# Patient Record
Sex: Male | Born: 1950 | Race: White | Hispanic: No | Smoking: Former smoker
Health system: Southern US, Community
[De-identification: ages and names within clinical notes are randomized; demographics above are authoritative.]

## PROBLEM LIST (undated history)

## (undated) DIAGNOSIS — S42101A Fracture of unspecified part of scapula, right shoulder, initial encounter for closed fracture: Secondary | ICD-10-CM

## (undated) DIAGNOSIS — R7303 Prediabetes: Secondary | ICD-10-CM

## (undated) DIAGNOSIS — F329 Major depressive disorder, single episode, unspecified: Secondary | ICD-10-CM

## (undated) DIAGNOSIS — F1011 Alcohol abuse, in remission: Secondary | ICD-10-CM

## (undated) DIAGNOSIS — C61 Malignant neoplasm of prostate: Secondary | ICD-10-CM

## (undated) DIAGNOSIS — S2249XA Multiple fractures of ribs, unspecified side, initial encounter for closed fracture: Secondary | ICD-10-CM

## (undated) DIAGNOSIS — K219 Gastro-esophageal reflux disease without esophagitis: Secondary | ICD-10-CM

## (undated) DIAGNOSIS — D45 Polycythemia vera: Secondary | ICD-10-CM

## (undated) DIAGNOSIS — I499 Cardiac arrhythmia, unspecified: Secondary | ICD-10-CM

## (undated) DIAGNOSIS — G4733 Obstructive sleep apnea (adult) (pediatric): Secondary | ICD-10-CM

## (undated) DIAGNOSIS — F32A Depression, unspecified: Secondary | ICD-10-CM

## (undated) DIAGNOSIS — I4891 Unspecified atrial fibrillation: Secondary | ICD-10-CM

## (undated) DIAGNOSIS — R7989 Other specified abnormal findings of blood chemistry: Secondary | ICD-10-CM

## (undated) DIAGNOSIS — G4730 Sleep apnea, unspecified: Secondary | ICD-10-CM

## (undated) DIAGNOSIS — M109 Gout, unspecified: Secondary | ICD-10-CM

## (undated) DIAGNOSIS — Z973 Presence of spectacles and contact lenses: Secondary | ICD-10-CM

## (undated) HISTORY — DX: Fracture of unspecified part of scapula, right shoulder, initial encounter for closed fracture: S42.101A

## (undated) HISTORY — PX: OTHER SURGICAL HISTORY: SHX169

## (undated) HISTORY — DX: Unspecified atrial fibrillation: I48.91

## (undated) HISTORY — DX: Multiple fractures of ribs, unspecified side, initial encounter for closed fracture: S22.49XA

## (undated) HISTORY — DX: Other specified abnormal findings of blood chemistry: R79.89

## (undated) HISTORY — DX: Gastro-esophageal reflux disease without esophagitis: K21.9

## (undated) HISTORY — DX: Major depressive disorder, single episode, unspecified: F32.9

## (undated) HISTORY — DX: Gout, unspecified: M10.9

## (undated) HISTORY — DX: Polycythemia vera: D45

## (undated) HISTORY — DX: Alcohol abuse, in remission: F10.11

## (undated) HISTORY — PX: PENILE PROSTHESIS IMPLANT: SHX240

## (undated) HISTORY — DX: Obstructive sleep apnea (adult) (pediatric): G47.33

## (undated) HISTORY — DX: Depression, unspecified: F32.A

---

## 2003-06-19 ENCOUNTER — Ambulatory Visit (HOSPITAL_COMMUNITY): Admission: RE | Admit: 2003-06-19 | Discharge: 2003-06-19 | Payer: Self-pay | Admitting: Family Medicine

## 2004-12-29 ENCOUNTER — Ambulatory Visit (HOSPITAL_COMMUNITY): Admission: RE | Admit: 2004-12-29 | Discharge: 2004-12-29 | Payer: Self-pay | Admitting: Internal Medicine

## 2005-01-07 ENCOUNTER — Ambulatory Visit (HOSPITAL_COMMUNITY): Admission: RE | Admit: 2005-01-07 | Discharge: 2005-01-07 | Payer: Self-pay | Admitting: Internal Medicine

## 2005-01-08 ENCOUNTER — Ambulatory Visit (HOSPITAL_COMMUNITY): Admission: RE | Admit: 2005-01-08 | Discharge: 2005-01-08 | Payer: Self-pay | Admitting: Internal Medicine

## 2005-01-15 ENCOUNTER — Ambulatory Visit: Admission: RE | Admit: 2005-01-15 | Discharge: 2005-01-15 | Payer: Self-pay | Admitting: Internal Medicine

## 2005-02-04 ENCOUNTER — Ambulatory Visit: Payer: Self-pay | Admitting: Pulmonary Disease

## 2005-08-26 ENCOUNTER — Ambulatory Visit (HOSPITAL_COMMUNITY): Admission: RE | Admit: 2005-08-26 | Discharge: 2005-08-26 | Payer: Self-pay | Admitting: Internal Medicine

## 2005-08-26 ENCOUNTER — Ambulatory Visit: Payer: Self-pay | Admitting: Internal Medicine

## 2006-06-27 ENCOUNTER — Ambulatory Visit: Payer: Self-pay | Admitting: Orthopedic Surgery

## 2006-07-14 ENCOUNTER — Inpatient Hospital Stay (HOSPITAL_COMMUNITY): Admission: EM | Admit: 2006-07-14 | Discharge: 2006-07-19 | Payer: Self-pay | Admitting: Emergency Medicine

## 2006-07-27 ENCOUNTER — Emergency Department (HOSPITAL_COMMUNITY): Admission: EM | Admit: 2006-07-27 | Discharge: 2006-07-27 | Payer: Self-pay | Admitting: Emergency Medicine

## 2006-10-20 ENCOUNTER — Ambulatory Visit: Payer: Self-pay | Admitting: Orthopedic Surgery

## 2006-10-25 ENCOUNTER — Encounter (HOSPITAL_COMMUNITY): Admission: RE | Admit: 2006-10-25 | Discharge: 2006-11-24 | Payer: Self-pay | Admitting: Orthopedic Surgery

## 2006-10-27 ENCOUNTER — Ambulatory Visit (HOSPITAL_COMMUNITY): Admission: RE | Admit: 2006-10-27 | Discharge: 2006-10-27 | Payer: Self-pay | Admitting: Orthopedic Surgery

## 2006-11-02 ENCOUNTER — Ambulatory Visit: Payer: Self-pay | Admitting: Orthopedic Surgery

## 2006-11-29 ENCOUNTER — Ambulatory Visit: Payer: Self-pay

## 2006-12-02 ENCOUNTER — Encounter: Admission: RE | Admit: 2006-12-02 | Discharge: 2006-12-02 | Payer: Self-pay | Admitting: Orthopedic Surgery

## 2006-12-12 ENCOUNTER — Ambulatory Visit (HOSPITAL_BASED_OUTPATIENT_CLINIC_OR_DEPARTMENT_OTHER): Admission: RE | Admit: 2006-12-12 | Discharge: 2006-12-12 | Payer: Self-pay | Admitting: Orthopedic Surgery

## 2007-01-04 ENCOUNTER — Inpatient Hospital Stay (HOSPITAL_COMMUNITY): Admission: RE | Admit: 2007-01-04 | Discharge: 2007-01-08 | Payer: Self-pay | Admitting: Orthopedic Surgery

## 2008-04-19 HISTORY — PX: HIP SURGERY: SHX245

## 2008-09-09 ENCOUNTER — Observation Stay (HOSPITAL_COMMUNITY): Admission: EM | Admit: 2008-09-09 | Discharge: 2008-09-10 | Payer: Self-pay | Admitting: Emergency Medicine

## 2008-09-11 ENCOUNTER — Encounter (HOSPITAL_COMMUNITY): Admission: RE | Admit: 2008-09-11 | Discharge: 2008-10-11 | Payer: Self-pay | Admitting: Internal Medicine

## 2009-07-06 IMAGING — CR DG KNEE COMPLETE 4+V*R*
5 series · 5 of 5 positions shown · non-contrast
Comparison: none

CLINICAL DATA: 55 year old; ACL tear.
 RIGHT KNEE ? 4 VIEWS ? 10/27/06:

[view not recorded (1 of 5)]
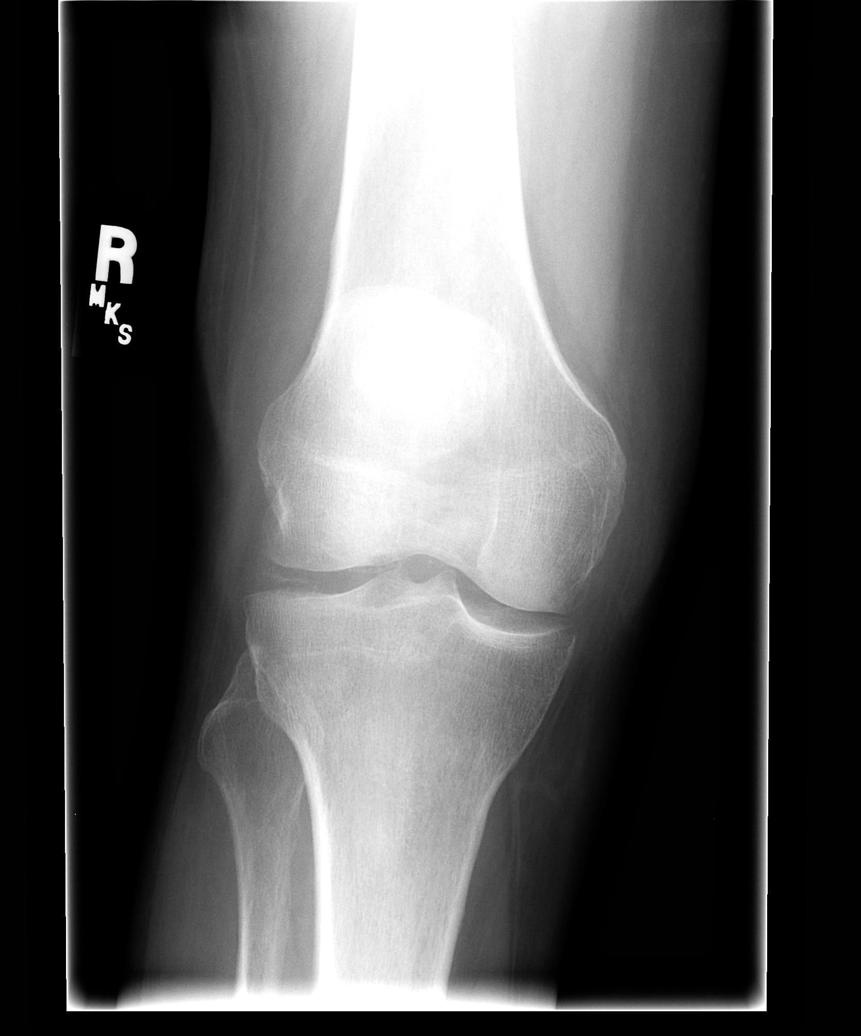

[view not recorded (2 of 5)]
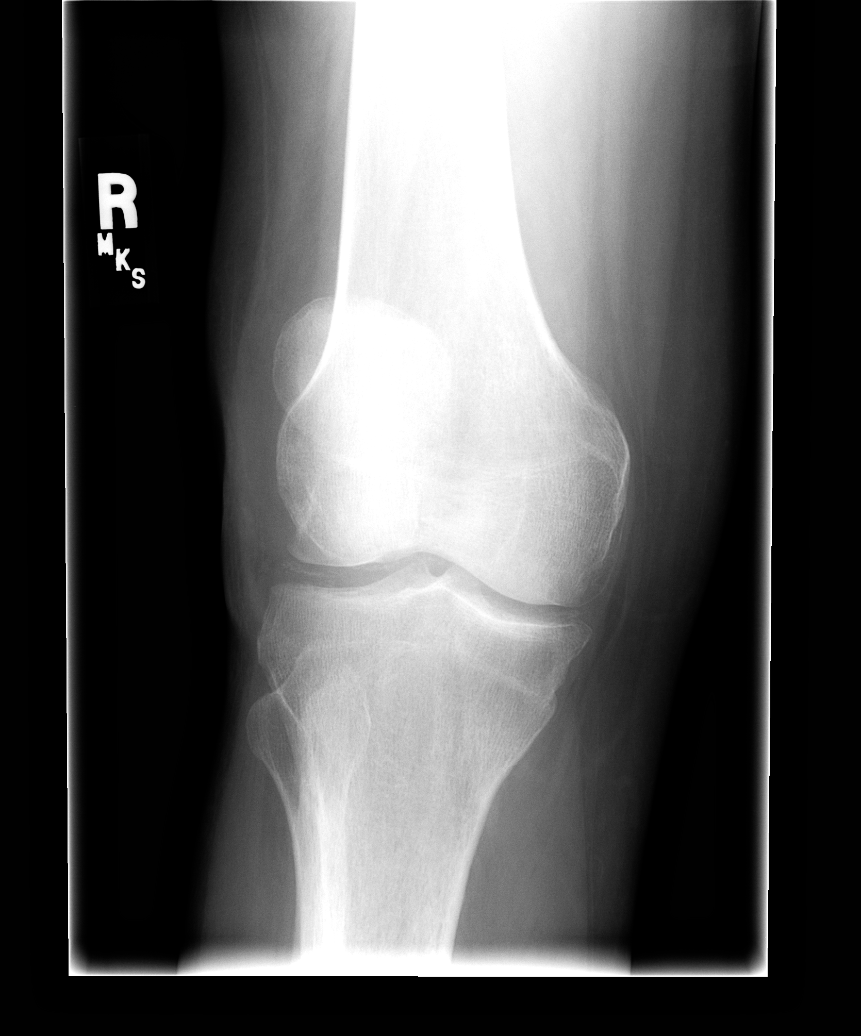

[view not recorded (3 of 5)]
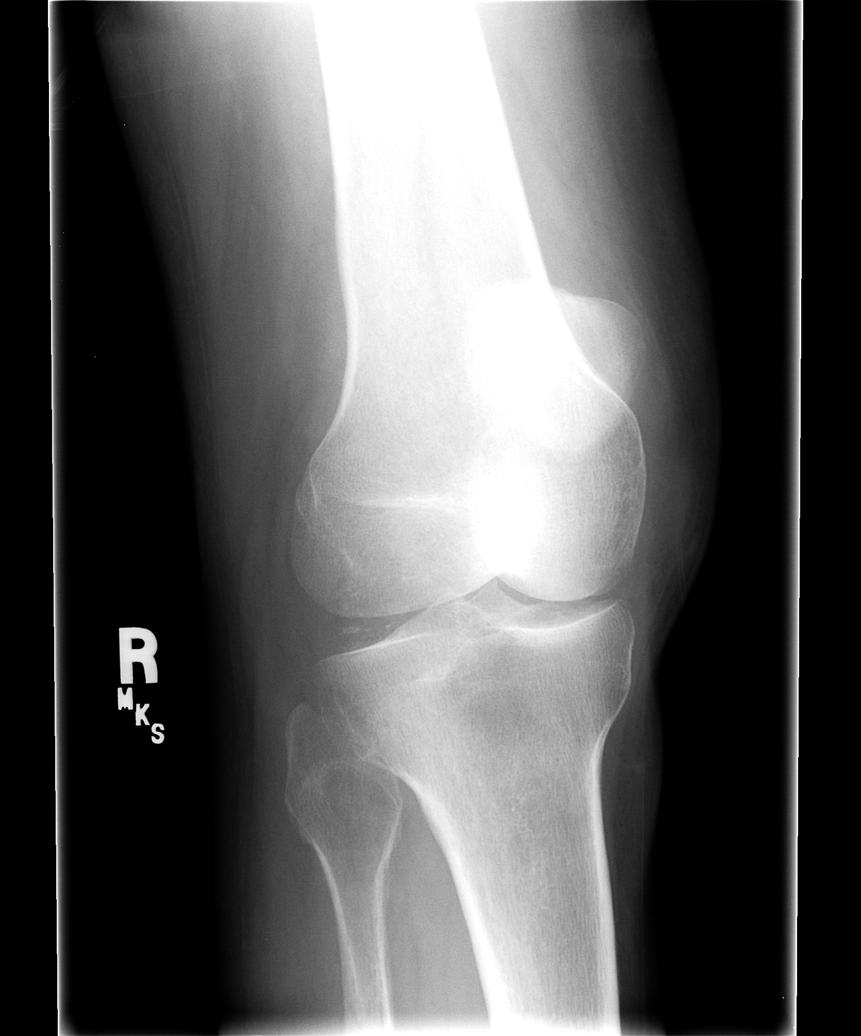

[view not recorded (4 of 5)]
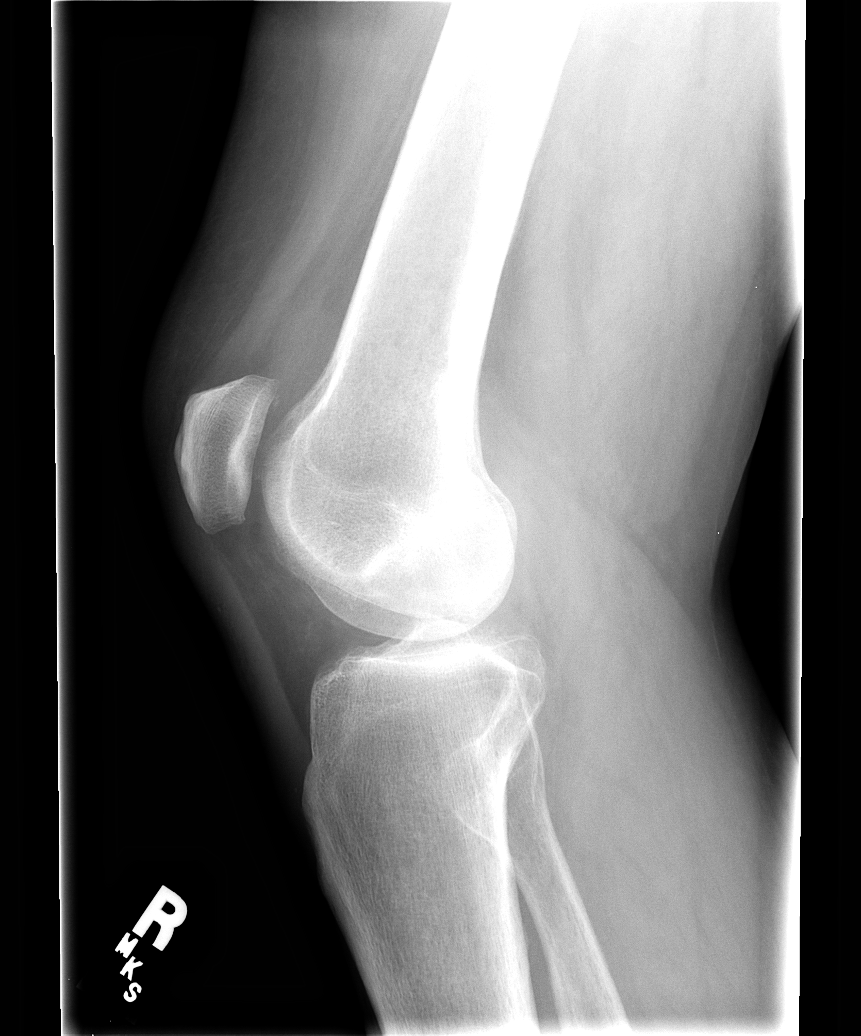

[view not recorded (5 of 5)]
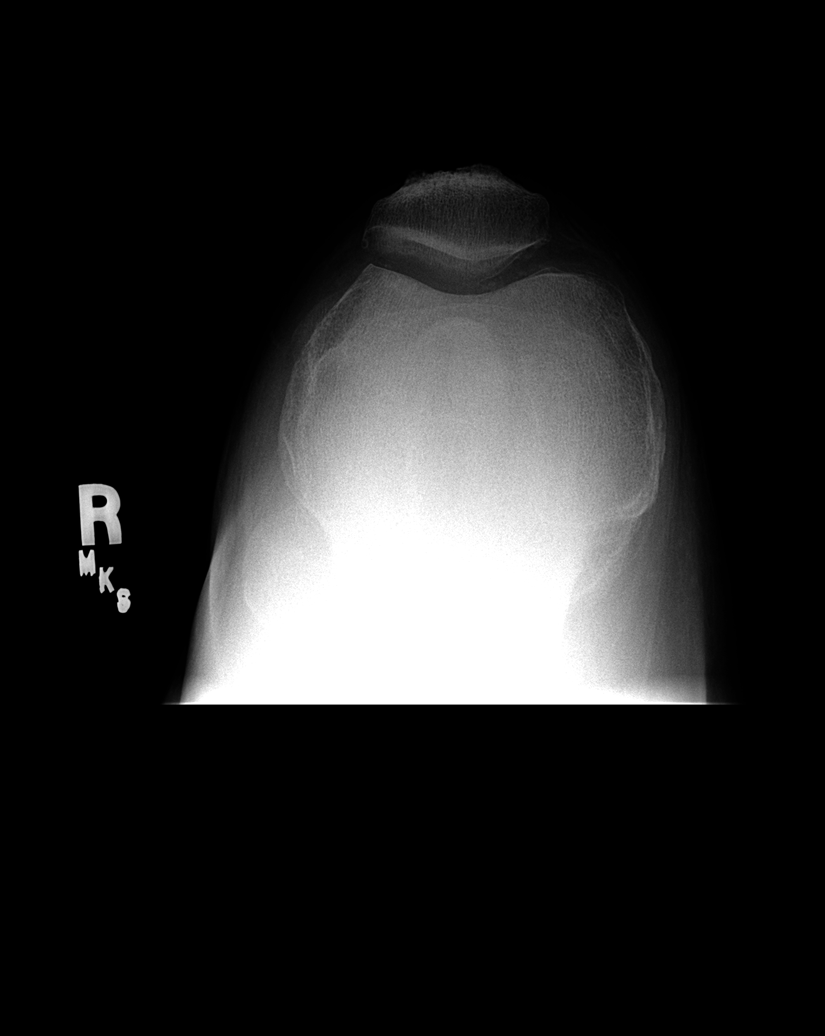

[5 of 5 positions shown; findings below may reference images not displayed]

FINDINGS: The joint spaces are fairly well maintained.  There is no significant degenerative change.  There is a small suprapatellar knee joint effusion.  Chondrocalcinosis is noted.
IMPRESSION: 1.  No acute bony findings.  
 2.  Small joint effusion.
 3.  Chondrocalcinosis.

## 2010-07-28 LAB — URINALYSIS, MICROSCOPIC ONLY
Bilirubin Urine: NEGATIVE
Ketones, ur: NEGATIVE mg/dL
Leukocytes, UA: NEGATIVE
Nitrite: NEGATIVE
Protein, ur: NEGATIVE mg/dL
Urobilinogen, UA: 0.2 mg/dL (ref 0.0–1.0)
pH: 5.5 (ref 5.0–8.0)

## 2010-07-28 LAB — DIFFERENTIAL
Basophils Absolute: 0.1 10*3/uL (ref 0.0–0.1)
Basophils Relative: 1 % (ref 0–1)
Eosinophils Absolute: 0.1 10*3/uL (ref 0.0–0.7)
Eosinophils Relative: 2 % (ref 0–5)
Lymphocytes Relative: 19 % (ref 12–46)
Lymphs Abs: 1.2 10*3/uL (ref 0.7–4.0)
Monocytes Absolute: 0.5 10*3/uL (ref 0.1–1.0)
Monocytes Relative: 7 % (ref 3–12)
Neutro Abs: 4.8 10*3/uL (ref 1.7–7.7)
Neutrophils Relative %: 72 % (ref 43–77)

## 2010-07-28 LAB — CARDIAC PANEL(CRET KIN+CKTOT+MB+TROPI)
CK, MB: 2.2 ng/mL (ref 0.3–4.0)
Total CK: 46 U/L (ref 7–232)
Troponin I: 0.02 ng/mL (ref 0.00–0.06)

## 2010-07-28 LAB — URINALYSIS, ROUTINE W REFLEX MICROSCOPIC
Bilirubin Urine: NEGATIVE
Glucose, UA: NEGATIVE mg/dL
Hgb urine dipstick: NEGATIVE
Ketones, ur: NEGATIVE mg/dL
Nitrite: NEGATIVE
Protein, ur: NEGATIVE mg/dL
Specific Gravity, Urine: 1.02 (ref 1.005–1.030)
Urobilinogen, UA: 0.2 mg/dL (ref 0.0–1.0)
pH: 6 (ref 5.0–8.0)

## 2010-07-28 LAB — CBC
HCT: 42.8 % (ref 39.0–52.0)
Hemoglobin: 15.3 g/dL (ref 13.0–17.0)
MCHC: 35.8 g/dL (ref 30.0–36.0)
MCV: 89.5 fL (ref 78.0–100.0)
Platelets: 199 10*3/uL (ref 150–400)
RBC: 4.78 MIL/uL (ref 4.22–5.81)
RDW: 13.2 % (ref 11.5–15.5)
WBC: 6.7 10*3/uL (ref 4.0–10.5)

## 2010-07-28 LAB — COMPREHENSIVE METABOLIC PANEL
Albumin: 4 g/dL (ref 3.5–5.2)
Alkaline Phosphatase: 73 U/L (ref 39–117)
BUN: 21 mg/dL (ref 6–23)
Calcium: 9.4 mg/dL (ref 8.4–10.5)
GFR calc Af Amer: >60 mL/min (ref >60)
GFR calc non Af Amer: 58 mL/min — ABNORMAL LOW (ref >60)
Glucose, Bld: 111 mg/dL — ABNORMAL HIGH (ref 70–99)
Total Protein: 6.6 g/dL (ref 6.0–8.3)

## 2010-07-28 LAB — LIPASE, BLOOD: Lipase: 27 U/L (ref 11–59)

## 2010-07-28 LAB — POCT CARDIAC MARKERS
Myoglobin, poc: 38.5 ng/mL (ref 12–200)
Troponin i, poc: <0.05 ng/mL (ref 0.00–0.09)
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
## 2010-09-01 NOTE — Op Note (Signed)
NAMEJAYMISON, LUBER NO.:  [id number]   MEDICAL RECORD NO.:  [id number]          PATIENT TYPE:  INP   LOCATION:  5025                         FACILITY:  [facility]   PHYSICIAN:  Feliberto Gottron. Turner Daniels, M.D.   DATE OF BIRTH:  1950-07-25   DATE OF PROCEDURE:  01/04/2007  DATE OF DISCHARGE:  01/08/2007                               OPERATIVE REPORT   PREOPERATIVE DIAGNOSIS:  Right proximal femur malunion after placement  of a DHS screw by another physician at another hospital.   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   PROCEDURE:  Right total hip arthroplasty complex using a DePuy 54 mm  cup, 47 NK +0 ball, ASR 20 x 15 x 165 x 42 stem, +0 neck, a 20 D small  cone was used on the stem.   SURGEON:  Feliberto Gottron. Turner Daniels, M.D.   FIRST ASSISTANT:  Skip Mayer PA-C.   ANESTHETIC:  General endotracheal.   ESTIMATED BLOOD LOSS:  400 mL.   DRAINS PLACED:  None.   TOURNIQUET TIME:  None.   FLUID REPLACEMENT:  1800 mL crystalloid.   INDICATIONS FOR PROCEDURE:  A 60 year old gentleman from [facility] who had a right hip intertrochanteric fracture about 8 or  9 months ago fixed by another physician with a DHS screw.  Unfortunately  went to settle into a nonunion in valgus and because of persistent  debilitating pain, he desires elective removal of the DHS screw and  conversion to a total hip arthroplasty.  Risks and benefits of surgery  discussed preoperatively.  A CT scan confirming the malunion/nonunion.  Risks and benefits of surgery discussed, questions answered.   DESCRIPTION OF PROCEDURE:  The patient identified by armband, taken to  the operating room at [facility].  Appropriate anesthetic  monitors were attached and general endotracheal anesthesia induced with  the patient in the supine position.  He was then placed in the left  lateral decubitus position and  fixed there with a Stulberg Mark II  pelvic clamp and the right lower extremity prepped and draped in usual  sterile fashion from the ankle to the hemipelvis.  The skin along the  lateral hip and thigh infiltrated with 20 mL of half percent Marcaine  and epinephrine solution and utilizing the old DHS incision distally a  posterolateral approach to the hip was made approximately 25 cm in  length through the skin and subcutaneous tissue.  Small bleeders were  identified and cauterized.  IT band was cut in line with the skin  incision exposing the vastus lateralis which was split over the plate  which was then easily removed by removing the screws, the side plate and  the barrel.  We then directed our attention to the posterior aspect of  the hip joint.  Cobra retractors were placed between the gluteus minimus  and superior hip joint capsule and between the quadratus femoris and  inferior hip joint capsule exposing the piriformis and short external  rotators which were then  cut off their insertion on the  intertrochanteric crest after tagging with a #2 Ethilon suture.  The  joint capsule was then developed into an acetabular based flap going  from posterior-superior out over the neck and posterior inferior and  likewise tagged with two #2 Ethibond sutures.  This exposed the  malunion, nonunion site which was not completely united.  The hip was  flexed and internally rotated, dislocating the femoral head which was  then cut 4 or 5 mm above the lesser trochanter where the  nonunion/malunion was anyway.  The head was almost in a straight valgus  with essentially no neck shaft angle.  At this point the proximal femur  was translocated anteriorly, levering off the anterior column with a  Homan retractor.  We sequentially reamed the acetabulum up to a 53 mm  basket reamer obtaining good coverage in all quadrants.  The acetabulum  was then irrigated out with normal saline solution and a 54-mm ASR  cup  was hammered into place in 45 degrees of abduction and 20 degrees of  anteversion.  At this point the hip was flexed and internally rotated  exposing the proximal femur which was sequentially reamed up to a 15.5  axial reamer followed by conical milling up to a 20 D cone and then the  calcar which was a small was actually placed into the greater trochanter  because of the malunion.  We then performed a trial with a 20 D small  cone trial stem NK +0, 47-mm trial ball was then placed.  The hip  reduced and stability noted to flexion of 90, internal rotation of 70  and he could not be dislocated in external rotation and extension.  At  this point the trial components were removed.  We hammered into place a  20 D small cone.  Again the spout was placed laterally because of the  malunion and then hammered into place a 20 x 15 x 165 x 42 stem in about  15 degrees of anteversion relation to the knee.  An NK +0 ,47 mm  ultimate ball was hammered on the stem.  The hip again reduced and  stability noted to be excellent.  At this point the wound was once again  irrigated out with normal saline solution.  Because of the shortness of  the neck from the malunion, a formal repair of the capsule and short  external rotators was not possible but they were sewed back to the  intertrochanteric crest.  Small bleeders were once again identified and  cauterized.  The vastus lateralis was closed with running zero Vicryl  suture.  The IT band with running #1 Vicryl suture.  The subcutaneous  tissue with zero and 2-0 undyed Vicryl suture and skin with running  interlocking 3-0 nylon suture.  A dressing of Xeroform, 4x4 dressing  sponges and Hypafix tape was then applied.  The patient was then  unclamped, rolled supine, awakened and taken to recovery room without  difficulty.      Feliberto Gottron. Turner Daniels, M.D.  Electronically Signed     FJR/MEDQ  D:  03/20/2007  T:  03/20/2007  Job:  [id number]
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
## 2010-09-04 NOTE — Discharge Summary (Signed)
Brandon, Maddox NO.:  [id number]   MEDICAL RECORD NO.:  [id number]          PATIENT TYPE:  INP   LOCATION:  5025                         FACILITY:  [facility]   PHYSICIAN:  Feliberto Gottron. Turner Daniels, M.D.   DATE OF BIRTH:  1950-12-30   DATE OF ADMISSION:  01/04/2007  DATE OF DISCHARGE:  01/08/2007                               DISCHARGE SUMMARY   DIAGNOSES:  Malunion of right hip, status post open reduction and  internal fixation.   PROCEDURE WHILE IN HOSPITAL:  Right total hip arthroplasty.   DISCHARGE SUMMARY:  Patient is a 60 year old male with a history of pain  in his right hip after undergoing ORIF on July 14, 2006 by Dr. Hilda Lias.  CT revealed a positive malunion.  He has failed conservative treatment  and now desires a right total hip arthroplasty after removal of  hardware.  The risks and benefits were discussed.  He wishes to proceed.   ALLERGIES:  PENICILLIN WHICH CAUSES SWELLING.   MEDICATIONS ON ADMISSION:  Colchicine, Roxicet and omeprazole.   PAST MEDICAL HISTORY:  Usual childhood diseases.  Adult:  History of  gout and peptic ulcer disease.   PAST SURGICAL HISTORY:  Pyloric stenosis in 1953, knee scopes in 1988,  2001 and 2008, penile implant in 1996, ORIF of right hip in 2008.   FAMILY HISTORY:  Mother alive and well at age 79, history of breast  cancer.  Father alive at 9, history of diabetes.   REVIEW OF SYSTEMS:  Positive for glasses, abnormal heartbeat.  He denies  any shortness of breath, chest pain or recent illness.   PHYSICAL EXAMINATION:  VITAL SIGNS:  Temperature 99.7, pulse 82,  respirations 16, blood pressure 137/92.  He is a 5 feet 11 inch, 210-  pound male.  HEENT:  Head is normocephalic and atraumatic.  Ears:  TMs are clear.  Eyes:  Pupils equal, round and reactive to light and accommodation.  Nose:  Patent.  NECK:  Supple.  Full range of motion.  CHEST:  Clear to auscultation and percussion.  HEART:  Regular rate and rhythm.  ABDOMEN:  Soft and nontender.  EXTREMITIES:  Right hip range of motion:  Forward flexion of 90 degrees,  internal rotation 0 degrees positive pain, external rotation 50 degrees  positive pain, positive foot tap.  SKIN:  There is a well-healed normal scar from previous surgery.   LABORATORY STUDIES:  Preoperative CT showed positive malunion.   LABORATORY DATA:  Preoperative labs including CBC, CMET, chest x-ray,  EKG, PT and PTT were within normal limits.   HOSPITAL COURSE:  The patient was taken to the operating room at [facility] where he underwent a right total hip arthroplasty using DePuy  ASR components, a 54-mm acetabular cup that accepted a size 47 ball.  Stem was a 20 x 15 x 165 42 standard neck plus 0.  A 20 degree small  cone was used.  Patient was placed on perioperative antibiotics.  He was  placed on postoperative Coumadin prophylaxis with target INR of 1.5 to 2  with bridging Lovenox  until it became therapeutic.  He was given a PCA  pump for pain control.  He developed tachycardia from 120-130.  An EKG  was done which showed an inferior infarct.  He was in no distress at  that time but rapid response was notified.  Cardiology was consulted  preoperatively.  He is a patient of Dr. Patty Sermons, and he was kind  enough to consult and do the full workup regarding the patient's  tachycardia.  On postop day 1, the patient was awake and alert,  reporting no nausea or vomiting.  Denies any shortness of breath or  chest pain.  Vital signs are stable.  He is afebrile with a hemoglobin  of 11.4, WBC 7.6, PT 13.3, pulse of 101, temperature 98.6.  Wound was  clean and dry with scant drainage.  X-rays shows well placed and fixed  complex total hip arthroplasty.  Urine output 85 mL per shift.  He began  physical therapy, 50% partial weightbearing with PT of his right lower  extremity.  He will continue with physical therapy.  He was judged to be  stable by cardiology and they continued  to monitor.  On postoperative  day 2, patient continued to be radiologically stable.  Orthopedically he  was making steady progress.  Hemoglobin dropped 9.6, WBC of 5.6, INR  1.1, pulse 88.  He was afebrile.  Dressing was dry.  His abdomen was  soft.  Otherwise stable.  PCA was discontinued.  On postoperative day 3,  patient continued to make steady progress in physical therapy.  He had  no new complaints of cardiology.  Continued to improve in both range of  motion and decreased pain.  On postoperative day 4, the patient was  without complaint.  He was afebrile.  Vital signs were stable.  Hemoglobin 8.6.  Chest was clear.  Incision was clean and dry.  He was  otherwise neurovascularly intact, medically stable and orthopedically  improved.  He had symptoms of blood loss anemia.  He was discharged home  to the care of his family.   ACTIVITY:  Is 50% weightbearing with total hip precautions and walker.  Dressing changes daily or as needed.   DIET:  Is regular.   MEDICATIONS:  Resume home medications including colchicine, Prilosec and  was given prescriptions for Robaxin, Percocet and Coumadin.   FOLLOW UP:  He is to return to clinic in 1 week's time for followup with  Dr. Turner Daniels, sooner if he is having difficulties orthopedically.  He will  return with Dr. Patty Sermons if he has any trouble with his heart.   DIAGNOSIS:  Once again the diagnosis for this admission was malunion  with pain, right hip.   PROCEDURE WHILE IN THE HOSPITAL:  Right total hip arthroplasty.      Laural Benes. Jannet Mantis.      Feliberto Gottron. Turner Daniels, M.D.  Electronically Signed    JBR/MEDQ  D:  03/06/2007  T:  03/06/2007  Job:  [id number]
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
## 2010-09-04 NOTE — Procedures (Signed)
NAMELORCAN, SHELP NO.:  [id number]   MEDICAL RECORD NO.:  [id number]          PATIENT TYPE:  OUT   LOCATION:  SLEEP LAB                     FACILITY:  [facility]   PHYSICIAN:  Marcelyn Bruins, M.D. [facility] DATE OF BIRTH:  1950-08-29   DATE OF STUDY:  01/15/2005                              NOCTURNAL POLYSOMNOGRAM   REFERRING PHYSICIAN:  Dr. Carylon Perches.   DATE OF STUDY:  January 15, 2005.   INDICATION FOR SURGERY:  Persistent disorder of initiating and maintaining  wakefulness.   EPWORTH SLEEPINESS SCORE:  18.   SLEEP ARCHITECTURE:  The patient has a total sleep time of 320 minutes with  adequate slow wave sleep, but decreased REM.  Sleep onset latency was  prolonged at 45 minutes and REM onset was fairly short at 73 minutes.  Sleep  efficiency was 82%.   RESPIRATORY DATA:  The patient was found to have moderate numbers of apneas  and hypopneas for a respiratory disturbance index of 11 events per hour.  Events were not positional, but they were associated with moderate-to-loud  snoring.   OXYGEN DATA:  The patient had a O2 desaturation as low as 85% associated  with his obstructive events.   CARDIAC DATA:  There were no clinically significant cardiac arrhythmias.   MOVEMENT/PARASOMNIA:  The patient was found to have large numbers of leg  jerks with mild-to-moderate sleep disruption.  This is unclear in the study  whether these are clinically significant or not.  Clinical correlation is  suggested.   IMPRESSION/RECOMMENDATIONS:  1.  Mild obstructive sleep apnea noted with oxygen desaturation as low as      85%.  Treatment for this degree of sleep apnea may include weight loss      if appropriate, upper airway surgery, oral appliance, and continuous      positive airway pressure.  2.  Leg jerks with mild-to-moderate sleep disruption of unknown clinical      significance.  Again, clinical correlation is suggested.     ______________________________  Marcelyn Bruins, M.D. [facility]  Diplomate, American Board of Sleep  Medicine     KC/MEDQ  D:  02/02/2005 08:25:18  T:  02/02/2005 09:28:03  Job:  [id number]

## 2011-01-01 ENCOUNTER — Other Ambulatory Visit: Payer: Self-pay | Admitting: Orthopedic Surgery

## 2011-01-01 ENCOUNTER — Ambulatory Visit
Admission: RE | Admit: 2011-01-01 | Discharge: 2011-01-01 | Disposition: A | Discharge status: Home / Self Care | Payer: BC Managed Care – PPO | Source: Ambulatory Visit | Attending: Orthopedic Surgery | Admitting: Orthopedic Surgery

## 2011-01-01 DIAGNOSIS — M25519 Pain in unspecified shoulder: Secondary | ICD-10-CM

## 2011-01-01 DIAGNOSIS — R0781 Pleurodynia: Secondary | ICD-10-CM

## 2011-01-07 ENCOUNTER — Encounter: Payer: Self-pay | Admitting: *Deleted

## 2011-01-07 ENCOUNTER — Ambulatory Visit (INDEPENDENT_AMBULATORY_CARE_PROVIDER_SITE_OTHER): Payer: BC Managed Care – PPO | Admitting: Thoracic Surgery

## 2011-01-07 ENCOUNTER — Encounter: Payer: Self-pay | Admitting: Thoracic Surgery

## 2011-01-07 VITALS — BP 104/68 | HR 84 | Resp 20 | Ht 71.0 in | Wt 215.0 lb

## 2011-01-07 DIAGNOSIS — S2249XA Multiple fractures of ribs, unspecified side, initial encounter for closed fracture: Secondary | ICD-10-CM | POA: Insufficient documentation
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
## 2011-01-12 ENCOUNTER — Encounter: Payer: Self-pay | Admitting: *Deleted

## 2011-01-28 LAB — BASIC METABOLIC PANEL
BUN: 16
CO2: 26
Calcium: 9.1
Creatinine, Ser: 1.1
GFR calc non Af Amer: >60
Glucose, Bld: 164 — ABNORMAL HIGH
Sodium: 137

## 2011-01-28 LAB — CBC
HCT: 24.5 — ABNORMAL LOW
HCT: 26 — ABNORMAL LOW
HCT: 37.6 — ABNORMAL LOW
Hemoglobin: 11.4 — ABNORMAL LOW
Hemoglobin: 13
Hemoglobin: 9.1 — ABNORMAL LOW
Hemoglobin: 9.6 — ABNORMAL LOW
MCHC: 34.5
MCHC: 34.6
MCHC: 34.7
MCHC: 35.1
MCV: 90.6
MCV: 90.6
MCV: 90.8
MCV: 91.6
Platelets: 168
Platelets: 198
RBC: 3.03 — ABNORMAL LOW
RBC: 3.63 — ABNORMAL LOW
RBC: 4.15 — ABNORMAL LOW
RDW: 13.7
RDW: 13.8
RDW: 14.2 — ABNORMAL HIGH
RDW: 14.3 — ABNORMAL HIGH
WBC: 7.6

## 2011-01-28 LAB — CK TOTAL AND CKMB (NOT AT ARMC)
CK, MB: 1.4
CK, MB: 2.1
Total CK: 120

## 2011-01-28 LAB — PROTIME-INR
INR: 1
Prothrombin Time: 13.3
Prothrombin Time: 14.5

## 2011-01-28 LAB — CARDIAC PANEL(CRET KIN+CKTOT+MB+TROPI): CK, MB: 2.3

## 2011-01-28 LAB — TROPONIN I: Troponin I: 0.02

## 2011-01-29 LAB — DIFFERENTIAL
Basophils Absolute: 0
Eosinophils Absolute: 0.3
Eosinophils Relative: 5
Lymphocytes Relative: 32
Lymphs Abs: 1.6
Neutrophils Relative %: 55

## 2011-01-29 LAB — CBC
HCT: 44
Hemoglobin: 15.4
MCHC: 35.1
MCV: 89.9
RBC: 4.9
WBC: 4.9

## 2011-01-29 LAB — COMPREHENSIVE METABOLIC PANEL
CO2: 28
Calcium: 9.7
Chloride: 100
Creatinine, Ser: 0.83
GFR calc non Af Amer: >60
Glucose, Bld: 97
Total Bilirubin: 1

## 2011-01-29 LAB — URINALYSIS, ROUTINE W REFLEX MICROSCOPIC
Glucose, UA: NEGATIVE
Hgb urine dipstick: NEGATIVE
Ketones, ur: NEGATIVE
Protein, ur: NEGATIVE
Urobilinogen, UA: 0.2

## 2011-01-29 LAB — POCT HEMOGLOBIN-HEMACUE
Hemoglobin: 17.1 — ABNORMAL HIGH
Operator id: 112821

## 2011-01-29 LAB — PROTIME-INR
INR: 0.9
Prothrombin Time: 12.6

## 2011-01-29 LAB — ABO/RH: ABO/RH(D): A POS

## 2011-01-29 LAB — TYPE AND SCREEN: ABO/RH(D): A POS

## 2011-02-04 ENCOUNTER — Ambulatory Visit: Payer: BC Managed Care – PPO | Admitting: Thoracic Surgery

## 2012-01-24 DIAGNOSIS — F32A Depression, unspecified: Secondary | ICD-10-CM | POA: Insufficient documentation

## 2012-01-24 DIAGNOSIS — L0390 Cellulitis, unspecified: Secondary | ICD-10-CM | POA: Insufficient documentation

## 2012-01-24 DIAGNOSIS — M109 Gout, unspecified: Secondary | ICD-10-CM | POA: Insufficient documentation

## 2012-01-24 DIAGNOSIS — E119 Type 2 diabetes mellitus without complications: Secondary | ICD-10-CM | POA: Insufficient documentation

## 2012-01-24 DIAGNOSIS — E785 Hyperlipidemia, unspecified: Secondary | ICD-10-CM | POA: Insufficient documentation

## 2012-01-24 DIAGNOSIS — E236 Other disorders of pituitary gland: Secondary | ICD-10-CM | POA: Insufficient documentation

## 2012-05-03 ENCOUNTER — Emergency Department (HOSPITAL_COMMUNITY)
Admission: EM | Admit: 2012-05-03 | Discharge: 2012-05-04 | Disposition: A | Discharge status: Home / Self Care | Payer: BC Managed Care – PPO | Attending: Emergency Medicine | Admitting: Emergency Medicine

## 2012-05-03 ENCOUNTER — Encounter (HOSPITAL_COMMUNITY): Payer: Self-pay

## 2012-05-03 ENCOUNTER — Encounter (HOSPITAL_COMMUNITY)
Admission: EM | Disposition: A | Discharge status: Home / Self Care | Payer: Self-pay | Source: Home / Self Care | Attending: Emergency Medicine

## 2012-05-03 ENCOUNTER — Emergency Department (HOSPITAL_COMMUNITY): Payer: BC Managed Care – PPO

## 2012-05-03 ENCOUNTER — Encounter (HOSPITAL_COMMUNITY): Payer: Self-pay | Admitting: Anesthesiology

## 2012-05-03 DIAGNOSIS — Y92009 Unspecified place in unspecified non-institutional (private) residence as the place of occurrence of the external cause: Secondary | ICD-10-CM | POA: Insufficient documentation

## 2012-05-03 DIAGNOSIS — Z23 Encounter for immunization: Secondary | ICD-10-CM | POA: Insufficient documentation

## 2012-05-03 DIAGNOSIS — F1010 Alcohol abuse, uncomplicated: Secondary | ICD-10-CM | POA: Insufficient documentation

## 2012-05-03 DIAGNOSIS — S0191XA Laceration without foreign body of unspecified part of head, initial encounter: Secondary | ICD-10-CM

## 2012-05-03 DIAGNOSIS — F10929 Alcohol use, unspecified with intoxication, unspecified: Secondary | ICD-10-CM

## 2012-05-03 DIAGNOSIS — W19XXXA Unspecified fall, initial encounter: Secondary | ICD-10-CM

## 2012-05-03 DIAGNOSIS — S0101XA Laceration without foreign body of scalp, initial encounter: Secondary | ICD-10-CM

## 2012-05-03 DIAGNOSIS — S0180XA Unspecified open wound of other part of head, initial encounter: Secondary | ICD-10-CM | POA: Insufficient documentation

## 2012-05-03 HISTORY — PX: LACERATION REPAIR: SHX5284

## 2012-05-03 LAB — CBC WITH DIFFERENTIAL/PLATELET
Basophils Absolute: 0 10*3/uL (ref 0.0–0.1)
Eosinophils Relative: 2 % (ref 0–5)
Lymphocytes Relative: 32 % (ref 12–46)
Lymphs Abs: 1.6 10*3/uL (ref 0.7–4.0)
Neutro Abs: 3.1 10*3/uL (ref 1.7–7.7)
Neutrophils Relative %: 61 % (ref 43–77)
Platelets: 154 10*3/uL (ref 150–400)
RBC: 4.86 MIL/uL (ref 4.22–5.81)
RDW: 12.3 % (ref 11.5–15.5)
WBC: 5.1 10*3/uL (ref 4.0–10.5)

## 2012-05-03 LAB — ETHANOL: Alcohol, Ethyl (B): 150 mg/dL — ABNORMAL HIGH (ref 0–11)

## 2012-05-03 LAB — BASIC METABOLIC PANEL
CO2: 27 mEq/L (ref 19–32)
Calcium: 9.3 mg/dL (ref 8.4–10.5)
Chloride: 103 mEq/L (ref 96–112)
Glucose, Bld: 104 mg/dL — ABNORMAL HIGH (ref 70–99)
Potassium: 4.7 mEq/L (ref 3.5–5.1)
Sodium: 142 mEq/L (ref 135–145)

## 2012-05-03 LAB — PROTIME-INR
INR: 1.07 (ref 0.00–1.49)
Prothrombin Time: 13.8 seconds (ref 11.6–15.2)

## 2012-05-03 SURGERY — REPAIR, LACERATION, 2 OR MORE
Anesthesia: General | Site: Scalp | Wound class: Dirty or Infected

## 2012-05-03 MED ORDER — CIPROFLOXACIN IN D5W 400 MG/200ML IV SOLN
400.0000 mg | Freq: Once | INTRAVENOUS | Status: AC
  Administered 2012-05-03: 400 mg via INTRAVENOUS
  Filled 2012-05-03: qty 200

## 2012-05-03 MED ORDER — MORPHINE SULFATE 4 MG/ML IJ SOLN
4.0000 mg | Freq: Once | INTRAMUSCULAR | Status: AC
  Administered 2012-05-03: 4 mg via INTRAVENOUS
  Filled 2012-05-03: qty 1

## 2012-05-03 MED ORDER — TETANUS-DIPHTH-ACELL PERTUSSIS 5-2.5-18.5 LF-MCG/0.5 IM SUSP
0.5000 mL | Freq: Once | INTRAMUSCULAR | Status: AC
  Administered 2012-05-03: 0.5 mL via INTRAMUSCULAR
  Filled 2012-05-03: qty 0.5

## 2012-05-03 MED ORDER — LACTATED RINGERS IV SOLN
INTRAVENOUS | Status: DC | PRN
  Administered 2012-05-03 – 2012-05-04 (×2): via INTRAVENOUS

## 2012-05-03 MED ORDER — LIDOCAINE-EPINEPHRINE 1 %-1:100000 IJ SOLN
INTRAMUSCULAR | Status: DC | PRN
  Administered 2012-05-03: 5 mL

## 2012-05-03 MED ORDER — 0.9 % SODIUM CHLORIDE (POUR BTL) OPTIME
TOPICAL | Status: DC | PRN
  Administered 2012-05-03: 1000 mL

## 2012-05-03 SURGICAL SUPPLY — 38 items
CANISTER SUCTION 2500CC (MISCELLANEOUS) ×2 IMPLANT
CLEANER TIP ELECTROSURG 2X2 (MISCELLANEOUS) ×2 IMPLANT
CLOTH BEACON ORANGE TIMEOUT ST (SAFETY) ×2 IMPLANT
CORDS BIPOLAR (ELECTRODE) ×1 IMPLANT
COVER SURGICAL LIGHT HANDLE (MISCELLANEOUS) ×2 IMPLANT
DRAIN CHANNEL 7F FF FLAT (WOUND CARE) IMPLANT
ELECT COATED BLADE 2.86 ST (ELECTRODE) ×2 IMPLANT
ELECT REM PT RETURN 9FT ADLT (ELECTROSURGICAL) ×2
ELECTRODE REM PT RTRN 9FT ADLT (ELECTROSURGICAL) ×1 IMPLANT
EVACUATOR SILICONE 100CC (DRAIN) IMPLANT
GLOVE BIOGEL M 7.0 STRL (GLOVE) ×4 IMPLANT
GLOVE BIOGEL PI IND STRL 7.0 (GLOVE) IMPLANT
GLOVE BIOGEL PI IND STRL 7.5 (GLOVE) ×2 IMPLANT
GLOVE BIOGEL PI INDICATOR 7.0 (GLOVE) ×1
GLOVE BIOGEL PI INDICATOR 7.5 (GLOVE) ×2
GLOVE SURG SS PI 7.5 STRL IVOR (GLOVE) ×2 IMPLANT
GOWN PREVENTION PLUS XLARGE (GOWN DISPOSABLE) ×2 IMPLANT
GOWN STRL NON-REIN LRG LVL3 (GOWN DISPOSABLE) ×3 IMPLANT
KIT BASIN OR (CUSTOM PROCEDURE TRAY) ×2 IMPLANT
KIT ROOM TURNOVER OR (KITS) ×2 IMPLANT
LOCATOR NERVE 3 VOLT (DISPOSABLE) IMPLANT
NS IRRIG 1000ML POUR BTL (IV SOLUTION) ×2 IMPLANT
PAD ARMBOARD 7.5X6 YLW CONV (MISCELLANEOUS) ×4 IMPLANT
PENCIL BUTTON HOLSTER BLD 10FT (ELECTRODE) ×2 IMPLANT
STAPLER VISISTAT 35W (STAPLE) ×1 IMPLANT
SUT ETHILON 3 0 PS 1 (SUTURE) IMPLANT
SUT ETHILON 5 0 PS 2 18 (SUTURE) ×1 IMPLANT
SUT ETHILON 6 0 P 1 (SUTURE) ×1 IMPLANT
SUT SILK 2 0 FS (SUTURE) IMPLANT
SUT SILK 3 0 REEL (SUTURE) ×1 IMPLANT
SUT VIC AB 3-0 PS2 18 (SUTURE) ×4
SUT VIC AB 3-0 PS2 18XBRD (SUTURE) IMPLANT
SUT VIC AB 4-0 P-3 18X BRD (SUTURE) IMPLANT
SUT VIC AB 4-0 P3 18 (SUTURE) ×6
TOWEL OR 17X24 6PK STRL BLUE (TOWEL DISPOSABLE) ×2 IMPLANT
TOWEL OR 17X26 10 PK STRL BLUE (TOWEL DISPOSABLE) ×2 IMPLANT
TRAY ENT MC OR (CUSTOM PROCEDURE TRAY) ×2 IMPLANT
WATER STERILE IRR 1000ML POUR (IV SOLUTION) ×1 IMPLANT
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
## 2012-05-03 NOTE — ED Notes (Signed)
Pt states last time had something to eat or drink 11am

## 2012-05-03 NOTE — Anesthesia Preprocedure Evaluation (Addendum)
Anesthesia Evaluation  Patient identified by MRN, date of birth, ID band Patient awake, Patient confused and Patient unresponsive    Reviewed: Allergy & Precautions, H&P , NPO status , Patient's Chart, lab work & pertinent test results  History of Anesthesia Complications Negative for: history of anesthetic complications  Airway Mallampati: I TM Distance: >3 FB Neck ROM: full    Dental  (+) Teeth Intact, Dental Advidsory Given and Caps   Pulmonary neg pulmonary ROS,  breath sounds clear to auscultation  Pulmonary exam normal       Cardiovascular negative cardio ROS  Rhythm:Regular Rate:Normal     Neuro/Psych Takes meds X 2 for depressionnegative neurological ROS     GI/Hepatic Neg liver ROS, GERD-  Controlled and Medicated,  Endo/Other  negative endocrine ROS  Renal/GU negative Renal ROS  negative genitourinary   Musculoskeletal   Abdominal (+) + obese,   Peds  Hematology negative hematology ROS (+)   Anesthesia Other Findings ETOH  Reproductive/Obstetrics                         Anesthesia Physical Anesthesia Plan  ASA: II and emergent  Anesthesia Plan: General   Post-op Pain Management:    Induction: Intravenous, Rapid sequence and Cricoid pressure planned  Airway Management Planned: Oral ETT  Additional Equipment:   Intra-op Plan:   Post-operative Plan: Extubation in OR  Informed Consent: I have reviewed the patients History and Physical, chart, labs and discussed the procedure including the risks, benefits and alternatives for the proposed anesthesia with the patient or authorized representative who has indicated his/her understanding and acceptance.   Dental Advisory Given  Plan Discussed with: Anesthesiologist, CRNA and Surgeon  Anesthesia Plan Comments: (Plan routine monitors, GETA with RSI)       Anesthesia Quick Evaluation

## 2012-05-03 NOTE — ED Provider Notes (Signed)
History     CSN: [id number]  Arrival date & time 05/03/12  2956   First MD Initiated Contact with Patient 05/03/12 1900      Chief Complaint  Patient presents with  . laceration to scalp   . Fall    (Consider location/radiation/quality/duration/timing/severity/associated sxs/prior treatment) Patient is a 62 y.o. male presenting with skin laceration. The history is provided by the patient. No language interpreter was used.  Laceration  The incident occurred less than 1 hour ago. The laceration is located on the scalp. The laceration is more than 20 cm in size. Injury mechanism: a fall. The pain is mild. The pain has been constant since onset. It is unknown if a foreign body is present. His tetanus status is unknown.    Past Medical History  Diagnosis Date  . Closed right scapular fracture   . Ribs, multiple fractures     displaced    Past Surgical History  Procedure Date  . Hip surgery 2010    DR. ROWAN    Family History  Problem Relation Age of Onset  . Diabetes Mother   . Diabetes Father     History  Substance Use Topics  . Smoking status: Former Smoker    Quit date: 04/20/2003  . Smokeless tobacco: Never Used  . Alcohol Use: No      Review of Systems  Constitutional: Negative for fever and chills.  HENT: Negative for congestion and sore throat.   Respiratory: Negative for cough and shortness of breath.   Cardiovascular: Negative for chest pain and leg swelling.  Gastrointestinal: Negative for nausea, vomiting, abdominal pain, diarrhea and constipation.  Genitourinary: Negative for dysuria and frequency.  Skin: Positive for wound. Negative for color change and rash.  Neurological: Negative for dizziness and headaches.  Psychiatric/Behavioral: Negative for confusion and agitation.  All other systems reviewed and are negative.    Allergies  Penicillins  Home Medications   Current Outpatient Rx  Name  Route  Sig  Dispense  Refill  . BUPROPION HCL  ER (XL) 150 MG PO TB24   Oral   Take 150 mg by mouth 3 x daily with food.          Marland Kitchen CITALOPRAM HYDROBROMIDE 40 MG PO TABS   Oral   Take 40 mg by mouth daily.          . FEBUXOSTAT 40 MG PO TABS   Oral   Take 80 mg by mouth daily.          Marland Kitchen GEMFIBROZIL 600 MG PO TABS   Oral   Take 600 mg by mouth 2 (two) times daily.          . MELOXICAM 15 MG PO TABS   Oral   Take 15 mg by mouth daily.          . OXYCODONE-ACETAMINOPHEN 5-325 MG PO TABS   Oral   Take 1 tablet by mouth every 4 (four) hours as needed.             BP 126/68  Pulse 91  Temp 98.1 F (36.7 C) (Oral)  SpO2 94%  Physical Exam  Constitutional: He is oriented to person, place, and time. He appears well-developed and well-nourished. No distress.  HENT:  Head: Normocephalic. Head is with laceration.    Eyes: EOM are normal. Pupils are equal, round, and reactive to light.  Neck: Normal range of motion. Neck supple.  Cardiovascular: Normal rate and regular rhythm.   Pulmonary/Chest: Effort  normal. No respiratory distress.  Abdominal: Soft. He exhibits no distension.  Musculoskeletal: Normal range of motion. He exhibits no edema.  Neurological: He is alert and oriented to person, place, and time.  Skin: Skin is warm and dry.  Psychiatric: He has a normal mood and affect. His behavior is normal.    ED Course  Procedures (including critical care time)  Labs Reviewed - No data to display No results found. Results for orders placed during the hospital encounter of 05/03/12  ETHANOL      Component Value Range   Alcohol, Ethyl (B) 150 (*) 0 - 11 mg/dL  CBC WITH DIFFERENTIAL      Component Value Range   WBC 5.1  4.0 - 10.5 K/uL   RBC 4.86  4.22 - 5.81 MIL/uL   Hemoglobin 15.2  13.0 - 17.0 g/dL   HCT 16.1  09.6 - 04.5 %   MCV 89.3  78.0 - 100.0 fL   MCH 31.3  26.0 - 34.0 pg   MCHC 35.0  30.0 - 36.0 g/dL   RDW 40.9  81.1 - 91.4 %   Platelets 154  150 - 400 K/uL   Neutrophils Relative 61  43  - 77 %   Neutro Abs 3.1  1.7 - 7.7 K/uL   Lymphocytes Relative 32  12 - 46 %   Lymphs Abs 1.6  0.7 - 4.0 K/uL   Monocytes Relative 6  3 - 12 %   Monocytes Absolute 0.3  0.1 - 1.0 K/uL   Eosinophils Relative 2  0 - 5 %   Eosinophils Absolute 0.1  0.0 - 0.7 K/uL   Basophils Relative 0  0 - 1 %   Basophils Absolute 0.0  0.0 - 0.1 K/uL  BASIC METABOLIC PANEL      Component Value Range   Sodium 142  135 - 145 mEq/L   Potassium 4.7  3.5 - 5.1 mEq/L   Chloride 103  96 - 112 mEq/L   CO2 27  19 - 32 mEq/L   Glucose, Bld 104 (*) 70 - 99 mg/dL   BUN 19  6 - 23 mg/dL   Creatinine, Ser 7.82  0.50 - 1.35 mg/dL   Calcium 9.3  8.4 - 95.6 mg/dL   GFR calc non Af Amer >90  >90 mL/min   GFR calc Af Amer >90  >90 mL/min  PROTIME-INR      Component Value Range   Prothrombin Time 13.8  11.6 - 15.2 seconds   INR 1.07  0.00 - 1.49   CT Head Wo Contrast (Final result)   Result time:05/03/12 2039    Final result by Rad Results In Interface (05/03/12 20:39:46)    Narrative:   *RADIOLOGY REPORT*  Clinical Data: Fall. Supraorbital scalp laceration.  CT HEAD AND ORBITS WITHOUT CONTRAST  Technique: Contiguous axial images were obtained from the base of the skull through the vertex without contrast. Multidetector CT imaging of the orbits was performed using the standard protocol without intravenous contrast.  Comparison: None.  CT HEAD  Findings: No acute cortical infarct, hemorrhage, or mass lesion is present. The ventricles are of normal size. No significant extra- axial fluid collection is present.  A large right supraorbital scalp hematoma is present. The soft tissue injury extends to the bone in front of the right frontal sinus. There is no underlying fracture. Gas dissects along the bone to the level of the right nasal bones.  IMPRESSION:  1. Large right supraorbital scalp hematoma with subcutaneous  gas. 2. No acute fracture. 3. Normal CT appearance of the brain.  CT  ORBITS  Findings: A large right supraorbital scalp hematoma is present. There is no underlying fracture. There is some gas dissecting within the post septal orbit along the roof of the orbit. The right globe is intact the lens is located. The intraorbital gas may have entered via a medial approach with gas dissecting along the superomedial orbit and nasal bones. Minimal mucosal thickening is present in the left maxillary sinus. The paranasal sinuses are otherwise clear.  IMPRESSION:  1. Large right supraorbital scalp hematoma without an underlying fracture. 2. Gas dissecting along the bone of the superomedial orbit extends into the post septal orbit and can be seen along the roof of the orbit. 2. No underlying fracture.   Original Report Authenticated By: Marin Roberts, M.D.             CT OrbitsS W/O CM (Final result)   Result time:05/03/12 2039    Final result by Rad Results In Interface (05/03/12 20:39:46)    Narrative:   *RADIOLOGY REPORT*  Clinical Data: Fall. Supraorbital scalp laceration.  CT HEAD AND ORBITS WITHOUT CONTRAST  Technique: Contiguous axial images were obtained from the base of the skull through the vertex without contrast. Multidetector CT imaging of the orbits was performed using the standard protocol without intravenous contrast.  Comparison: None.  CT HEAD  Findings: No acute cortical infarct, hemorrhage, or mass lesion is present. The ventricles are of normal size. No significant extra- axial fluid collection is present.  A large right supraorbital scalp hematoma is present. The soft tissue injury extends to the bone in front of the right frontal sinus. There is no underlying fracture. Gas dissects along the bone to the level of the right nasal bones.  IMPRESSION:  1. Large right supraorbital scalp hematoma with subcutaneous gas. 2. No acute fracture. 3. Normal CT appearance of the brain.  CT ORBITS  Findings: A large  right supraorbital scalp hematoma is present. There is no underlying fracture. There is some gas dissecting within the post septal orbit along the roof of the orbit. The right globe is intact the lens is located. The intraorbital gas may have entered via a medial approach with gas dissecting along the superomedial orbit and nasal bones. Minimal mucosal thickening is present in the left maxillary sinus. The paranasal sinuses are otherwise clear.  IMPRESSION:  1. Large right supraorbital scalp hematoma without an underlying fracture. 2. Gas dissecting along the bone of the superomedial orbit extends into the post septal orbit and can be seen along the roof of the orbit. 2. No underlying fracture.   Original Report Authenticated By: Marin Roberts, M.D.      No diagnosis found.    MDM  Pt w/ PMHx of HLD sustained mechanical fall. Drinking ETOH, tripped on object on porch struck head against corner of 4x4 post. No LOC or prodrome. Not on blood thinners. Sustained large lac to forehead/scalp - hemostatic. Wound approx 24cm, down to galia - doesn't appear to violate, extends from right supraorbital rim across forehead to mid parietal and curves inferior to temporal scalp. EOMI, PERRLA. No foreign body noted. Tetanus status unknown. GCS 15  Plan: will check CT head and orbits, ETOH, bmp, coags and CBC. Will update tetanus and give morphine for pain.   Course: reassessed, vitals stable, NAD, CT head - NAICA, no fx. ETOH 150, labs otherwise unremarkable. D/w ENT and pt taken to OR for wash out  and closure. Transfer care in stable condition.    1. Fall   2. Alcohol intoxication   3. Scalp laceration           Audelia Hives, MD 05/04/12 0003

## 2012-05-03 NOTE — ED Notes (Signed)
Pt to ed from home by ems.  Pt fell tripping -at home hitting head on 4x4 post.  Pt has large 10 inch laceration to forehead with bleeding controlled. denies loc at time of fall.

## 2012-05-03 NOTE — ED Notes (Signed)
Pt informed that he will need to not have anything to eat or drink while waiting.

## 2012-05-04 ENCOUNTER — Emergency Department (HOSPITAL_COMMUNITY): Payer: BC Managed Care – PPO | Admitting: Anesthesiology

## 2012-05-04 ENCOUNTER — Encounter (HOSPITAL_COMMUNITY): Payer: Self-pay | Admitting: Anesthesiology

## 2012-05-04 DIAGNOSIS — S0191XA Laceration without foreign body of unspecified part of head, initial encounter: Secondary | ICD-10-CM | POA: Diagnosis present

## 2012-05-04 MED ORDER — OXYCODONE HCL 5 MG PO TABS: 5.0000 mg | ORAL_TABLET | Freq: Once | ORAL | Status: DC | PRN

## 2012-05-04 MED ORDER — DEXAMETHASONE SODIUM PHOSPHATE 4 MG/ML IJ SOLN
INTRAMUSCULAR | Status: DC | PRN
  Administered 2012-05-04: 4 mg via INTRAVENOUS

## 2012-05-04 MED ORDER — SUFENTANIL CITRATE 50 MCG/ML IV SOLN
INTRAVENOUS | Status: DC | PRN
  Administered 2012-05-04 (×2): 10 ug via INTRAVENOUS

## 2012-05-04 MED ORDER — FENTANYL CITRATE 0.05 MG/ML IJ SOLN: 25.0000 ug | INTRAMUSCULAR | Status: DC | PRN

## 2012-05-04 MED ORDER — MIDAZOLAM HCL 2 MG/2ML IJ SOLN: 0.5000 mg | Freq: Once | INTRAMUSCULAR | Status: DC | PRN

## 2012-05-04 MED ORDER — ARTIFICIAL TEARS OP OINT
TOPICAL_OINTMENT | OPHTHALMIC | Status: DC | PRN
  Administered 2012-05-04: 1 via OPHTHALMIC

## 2012-05-04 MED ORDER — PROPOFOL 10 MG/ML IV BOLUS
INTRAVENOUS | Status: DC | PRN
  Administered 2012-05-04: 50 mg via INTRAVENOUS
  Administered 2012-05-04: 200 mg via INTRAVENOUS

## 2012-05-04 MED ORDER — PROMETHAZINE HCL 25 MG/ML IJ SOLN: 6.2500 mg | INTRAMUSCULAR | Status: DC | PRN

## 2012-05-04 MED ORDER — LIDOCAINE HCL (CARDIAC) 20 MG/ML IV SOLN
INTRAVENOUS | Status: DC | PRN
  Administered 2012-05-04: 100 mg via INTRAVENOUS

## 2012-05-04 MED ORDER — MEPERIDINE HCL 25 MG/ML IJ SOLN: 6.2500 mg | INTRAMUSCULAR | Status: DC | PRN

## 2012-05-04 MED ORDER — LIDOCAINE HCL 4 % MT SOLN
OROMUCOSAL | Status: DC | PRN
  Administered 2012-05-04: 4 mL via TOPICAL

## 2012-05-04 MED ORDER — HYDROCODONE-ACETAMINOPHEN 5-325 MG PO TABS: 1.0000 | ORAL_TABLET | Freq: Four times a day (QID) | ORAL | Status: DC | PRN

## 2012-05-04 MED ORDER — LEVOFLOXACIN 500 MG PO TABS: 500.0000 mg | ORAL_TABLET | Freq: Every day | ORAL | Status: DC

## 2012-05-04 MED ORDER — SUCCINYLCHOLINE CHLORIDE 20 MG/ML IJ SOLN
INTRAMUSCULAR | Status: DC | PRN
  Administered 2012-05-04: 120 mg via INTRAVENOUS

## 2012-05-04 MED ORDER — EPHEDRINE SULFATE 50 MG/ML IJ SOLN
INTRAMUSCULAR | Status: DC | PRN
  Administered 2012-05-04 (×2): 10 mg via INTRAVENOUS

## 2012-05-04 MED ORDER — MIDAZOLAM HCL 5 MG/5ML IJ SOLN
INTRAMUSCULAR | Status: DC | PRN
  Administered 2012-05-04: 2 mg via INTRAVENOUS

## 2012-05-04 MED ORDER — ONDANSETRON HCL 4 MG/2ML IJ SOLN
INTRAMUSCULAR | Status: DC | PRN
  Administered 2012-05-04: 4 mg via INTRAVENOUS

## 2012-05-04 MED ORDER — OXYCODONE HCL 5 MG/5ML PO SOLN: 5.0000 mg | Freq: Once | ORAL | Status: DC | PRN

## 2012-05-04 MED ORDER — PHENYLEPHRINE HCL 10 MG/ML IJ SOLN
INTRAMUSCULAR | Status: DC | PRN
  Administered 2012-05-04: 160 ug via INTRAVENOUS
  Administered 2012-05-04 (×2): 80 ug via INTRAVENOUS

## 2012-05-04 MED ORDER — BACITRACIN ZINC 500 UNIT/GM EX OINT
TOPICAL_OINTMENT | CUTANEOUS | Status: DC | PRN
  Administered 2012-05-04: 1 via TOPICAL

## 2012-05-04 NOTE — Transfer of Care (Signed)
Immediate Anesthesia Transfer of Care Note  Patient: Brandon Maddox  Procedure(s) Performed: Procedure(s) (LRB) with comments: REPAIR MULTIPLE LACERATIONS (N/A)  Patient Location: PACU  Anesthesia Type:General  Level of Consciousness: awake, alert , oriented and patient cooperative  Airway & Oxygen Therapy: Patient Spontanous Breathing and Patient connected to nasal cannula oxygen  Post-op Assessment: Report given to PACU RN, Post -op Vital signs reviewed and stable and Patient moving all extremities X 4  Post vital signs: Reviewed and stable  Complications: No apparent anesthesia complications

## 2012-05-04 NOTE — ED Provider Notes (Signed)
I have personally seen and examined the patient.  I have discussed the plan of care with the resident.  I have reviewed the documentation on PMH/FH/Soc. History.  I have reviewed the documentation of the resident and agree.  Pt with large scalp laceration.  Advised ENT evaluation.  Pt stable in the ED.  He has no other signs of extremity/spinal trauma  Joya Gaskins, MD 05/04/12 1546

## 2012-05-04 NOTE — Op Note (Signed)
NAMEHALLIS, MEDITZ NO.:  [id number]  MEDICAL RECORD NO.:  [id number]  LOCATION:  [facility]                         FACILITY:  [facility]  PHYSICIAN:  Kinnie Scales. Annalee Genta, M.D.DATE OF BIRTH:  Nov 07, 1950  DATE OF PROCEDURE:  05/04/2012 DATE OF DISCHARGE:  05/04/2012                              OPERATIVE REPORT   PREOPERATIVE DIAGNOSES:  A 30-cm complex facial laceration involving the right upper eyelid, brow, forehead, and scalp with partial degloving.  POSTOPERATIVE DIAGNOSES:  A 30-cm complex facial laceration involving the right upper eyelid, brow, forehead, and scalp with partial degloving.  INDICATIONS FOR SURGERY:  A 30-cm complex facial laceration involving the right upper eyelid, brow, forehead, and scalp with partial degloving.  PROCEDURE:  Complex repair of facial and scalp laceration.  SURGEON:  Kinnie Scales. Annalee Genta, MD.  ANESTHESIA:  General endotracheal.  COMPLICATIONS:  None.  ESTIMATED BLOOD LOSS:  Approximately 50 mL.  The patient transferred from the operating room to the recovery room in stable condition.  BRIEF HISTORY:  The patient is a 62 year old, white male, who presented via EMS to the [facility] Emergency Department for evaluation of a complex facial and scalp lacerations.  The patient was intoxicated and reported stripping, striking his face against a wooden post.  There is a moderate amount of blood loss, evaluated in the emergency room by the emergency room physicians and staff.  A CT scan of the head showed no evidence of skull fracture or intracranial injury.  No periorbital or bony fractures of the face.  ENT/facial trauma service was consulted for evaluation and management of the patient's complex laceration.  The patient was evaluated and CT scans and medical history were reviewed. Given his history and findings, he was taken to the operating room for emergency reconstruction of his complex facial  lacerations.  DESCRIPTION OF THE PROCEDURE:  The patient was brought to the operating room, placed in supine position on the operating table.  General endotracheal anesthesia was established without difficulty.  The patient was adequately anesthetized, he was injected with a total of 5 mL of 1% lidocaine 1:100,000 solution epinephrine injected along the laceration. The patient's extensive wounds were then meticulously debrided and cleansed with half-strength hydrogen peroxide solution followed by Betadine paint.  The patient was then prepped and draped for the surgical procedure. The patient prepared for surgery, complex reconstruction was undertaken beginning with reapproximation of the right medial orbicularis musculature complex, which had been completely torn away from its muscular attachments along the superior aspect of the orbit.  Facial musculature and frontalis muscles were then carefully closed in layers with interrupted 3-0 and 4-0 Vicryl sutures.  The lacerations were then closed, bleeding from superficial to deep, with closure with 3-0 and 4-0 interrupted Vicryl sutures.  The final skin edge approximation was achieved with a combination of 5-0 and 6-0 Ethilon sutures in a running locked fashion.  Total laceration length was 30 cm extending from the right periorbital region, across the brow, forehead, and into the right temporalis aspect of the right temple aspect of scalp.  The patient's wound was then dressed with bacitracin ointment.  He was awakened from anesthetic, extubated and  then transferred from the operating room to the recovery room in stable condition.  No complications.          ______________________________ Kinnie Scales Annalee Genta, M.D.     DLS/MEDQ  D:  57/[id number]/[id number]  T:  05/04/2012  Job:  [id number]

## 2012-05-04 NOTE — H&P (Signed)
Brandon Maddox is an 62 y.o. male.   Chief Complaint: Complex laceration HPI: Pt intoxicated, fell striking his forehead and scalp. Complex laceration, no LOC, no frx's  Past Medical History  Diagnosis Date  . Closed right scapular fracture   . Ribs, multiple fractures     displaced    Past Surgical History  Procedure Date  . Hip surgery 2010    DR. ROWAN    Family History  Problem Relation Age of Onset  . Diabetes Mother   . Diabetes Father    Social History:  reports that he quit smoking about 9 years ago. He has never used smokeless tobacco. He reports that he does not drink alcohol. His drug history not on file.  Allergies:  Allergies  Allergen Reactions  . Penicillins     Mother told him throat swelled up when he took at a young age      (Not in a hospital admission)  Results for orders placed during the hospital encounter of 05/03/12 (from the past 48 hour(s))  ETHANOL     Status: Abnormal   Collection Time   05/03/12  7:16 PM      Component Value Range Comment   Alcohol, Ethyl (B) 150 (*) 0 - 11 mg/dL   CBC WITH DIFFERENTIAL     Status: Normal   Collection Time   05/03/12  7:16 PM      Component Value Range Comment   WBC 5.1  4.0 - 10.5 K/uL    RBC 4.86  4.22 - 5.81 MIL/uL    Hemoglobin 15.2  13.0 - 17.0 g/dL    HCT 16.1  09.6 - 04.5 %    MCV 89.3  78.0 - 100.0 fL    MCH 31.3  26.0 - 34.0 pg    MCHC 35.0  30.0 - 36.0 g/dL    RDW 40.9  81.1 - 91.4 %    Platelets 154  150 - 400 K/uL    Neutrophils Relative 61  43 - 77 %    Neutro Abs 3.1  1.7 - 7.7 K/uL    Lymphocytes Relative 32  12 - 46 %    Lymphs Abs 1.6  0.7 - 4.0 K/uL    Monocytes Relative 6  3 - 12 %    Monocytes Absolute 0.3  0.1 - 1.0 K/uL    Eosinophils Relative 2  0 - 5 %    Eosinophils Absolute 0.1  0.0 - 0.7 K/uL    Basophils Relative 0  0 - 1 %    Basophils Absolute 0.0  0.0 - 0.1 K/uL   BASIC METABOLIC PANEL     Status: Abnormal   Collection Time   05/03/12  7:16 PM      Component  Value Range Comment   Sodium 142  135 - 145 mEq/L    Potassium 4.7  3.5 - 5.1 mEq/L    Chloride 103  96 - 112 mEq/L    CO2 27  19 - 32 mEq/L    Glucose, Bld 104 (*) 70 - 99 mg/dL    BUN 19  6 - 23 mg/dL    Creatinine, Ser 7.82  0.50 - 1.35 mg/dL    Calcium 9.3  8.4 - 95.6 mg/dL    GFR calc non Af Amer >90  >90 mL/min    GFR calc Af Amer >90  >90 mL/min   PROTIME-INR     Status: Normal   Collection Time   05/03/12  7:16  PM      Component Value Range Comment   Prothrombin Time 13.8  11.6 - 15.2 seconds    INR 1.07  0.00 - 1.49    Ct Head Wo Contrast  05/03/2012  *RADIOLOGY REPORT*  Clinical Data:  Fall.  Supraorbital scalp laceration.  CT HEAD AND ORBITS WITHOUT CONTRAST  Technique:  Contiguous axial images were obtained from the base of the skull through the vertex without contrast. Multidetector CT imaging of the orbits was performed using the standard protocol without intravenous contrast.  Comparison:   None.  CT HEAD  Findings: No acute cortical infarct, hemorrhage, or mass lesion is present.  The ventricles are of normal size.  No significant extra- axial fluid collection is present.  A large right supraorbital scalp hematoma is present.  The soft tissue injury extends to the bone in front of the right frontal sinus.  There is no underlying fracture.  Gas dissects along the bone to the level of the right nasal bones.  IMPRESSION:  1.  Large right supraorbital scalp hematoma with subcutaneous gas. 2.  No acute fracture. 3.  Normal CT appearance of the brain.  CT ORBITS  Findings: A large right supraorbital scalp hematoma is present. There is no underlying fracture.  There is some gas dissecting within the post septal orbit along the roof of the orbit.  The right globe is intact the lens is located.  The intraorbital gas may have entered via a medial approach with gas dissecting along the superomedial orbit and nasal bones.  Minimal mucosal thickening is present in the left maxillary sinus.  The  paranasal sinuses are otherwise clear.  IMPRESSION:  1.  Large right supraorbital scalp hematoma without an underlying fracture. 2.  Gas dissecting along the bone of the superomedial orbit extends into the post septal orbit and can be seen along the roof of the orbit. 2.  No underlying fracture.   Original Report Authenticated By: Marin Roberts, M.D.    Ct Orbitss W/o Cm  05/03/2012  *RADIOLOGY REPORT*  Clinical Data:  Fall.  Supraorbital scalp laceration.  CT HEAD AND ORBITS WITHOUT CONTRAST  Technique:  Contiguous axial images were obtained from the base of the skull through the vertex without contrast. Multidetector CT imaging of the orbits was performed using the standard protocol without intravenous contrast.  Comparison:   None.  CT HEAD  Findings: No acute cortical infarct, hemorrhage, or mass lesion is present.  The ventricles are of normal size.  No significant extra- axial fluid collection is present.  A large right supraorbital scalp hematoma is present.  The soft tissue injury extends to the bone in front of the right frontal sinus.  There is no underlying fracture.  Gas dissects along the bone to the level of the right nasal bones.  IMPRESSION:  1.  Large right supraorbital scalp hematoma with subcutaneous gas. 2.  No acute fracture. 3.  Normal CT appearance of the brain.  CT ORBITS  Findings: A large right supraorbital scalp hematoma is present. There is no underlying fracture.  There is some gas dissecting within the post septal orbit along the roof of the orbit.  The right globe is intact the lens is located.  The intraorbital gas may have entered via a medial approach with gas dissecting along the superomedial orbit and nasal bones.  Minimal mucosal thickening is present in the left maxillary sinus.  The paranasal sinuses are otherwise clear.  IMPRESSION:  1.  Large right supraorbital scalp  hematoma without an underlying fracture. 2.  Gas dissecting along the bone of the superomedial orbit  extends into the post septal orbit and can be seen along the roof of the orbit. 2.  No underlying fracture.   Original Report Authenticated By: Marin Roberts, M.D.     Review of Systems  Constitutional: Negative.   HENT: Negative.   Respiratory: Negative.   Cardiovascular: Negative.   Gastrointestinal: Negative.   Musculoskeletal: Negative.   Neurological: Negative.     Blood pressure 123/69, pulse 81, temperature 98.1 F (36.7 C), temperature source Oral, SpO2 95.00%. Physical Exam  Constitutional: He is oriented to person, place, and time. He appears well-developed and well-nourished.  HENT:       Complex laceration involving forehead and scalp  Neck: Normal range of motion. Neck supple.  Cardiovascular: Normal rate.   Respiratory: Effort normal.  GI: Soft. Bowel sounds are normal.  Musculoskeletal: Normal range of motion.  Neurological: He is alert and oriented to person, place, and time.     Assessment/Plan Pt adm for debridement and closure on complex laceration  Kaamil Morefield 05/04/2012, 12:35 AM

## 2012-05-04 NOTE — Anesthesia Postprocedure Evaluation (Signed)
  Anesthesia Post-op Note  Patient: AMIRR ACHORD  Procedure(s) Performed: Procedure(s) (LRB) with comments: REPAIR MULTIPLE LACERATIONS (N/A)  Patient Location: PACU  Anesthesia Type:General  Level of Consciousness: awake, alert , oriented and patient cooperative  Airway and Oxygen Therapy: Patient Spontanous Breathing  Post-op Pain: none  Post-op Assessment: Post-op Vital signs reviewed, Patient's Cardiovascular Status Stable, Respiratory Function Stable, Patent Airway, No signs of Nausea or vomiting and Pain level controlled  Post-op Vital Signs: Reviewed and stable  Complications: No apparent anesthesia complications

## 2012-05-05 ENCOUNTER — Encounter (HOSPITAL_COMMUNITY): Payer: Self-pay | Admitting: Otolaryngology

## 2012-10-02 DIAGNOSIS — N320 Bladder-neck obstruction: Secondary | ICD-10-CM | POA: Insufficient documentation

## 2012-10-17 DIAGNOSIS — N529 Male erectile dysfunction, unspecified: Secondary | ICD-10-CM | POA: Insufficient documentation

## 2013-04-02 ENCOUNTER — Emergency Department (HOSPITAL_COMMUNITY): Payer: BC Managed Care – PPO

## 2013-04-02 ENCOUNTER — Encounter (HOSPITAL_COMMUNITY): Payer: Self-pay | Admitting: Emergency Medicine

## 2013-04-02 ENCOUNTER — Inpatient Hospital Stay (HOSPITAL_COMMUNITY)
Admission: EM | Admit: 2013-04-02 | Discharge: 2013-04-04 | DRG: 310 | Disposition: A | Discharge status: Home / Self Care | Payer: BC Managed Care – PPO | Attending: Internal Medicine | Admitting: Internal Medicine

## 2013-04-02 DIAGNOSIS — I4891 Unspecified atrial fibrillation: Principal | ICD-10-CM | POA: Diagnosis present

## 2013-04-02 DIAGNOSIS — Z23 Encounter for immunization: Secondary | ICD-10-CM

## 2013-04-02 DIAGNOSIS — Z833 Family history of diabetes mellitus: Secondary | ICD-10-CM

## 2013-04-02 DIAGNOSIS — Z87891 Personal history of nicotine dependence: Secondary | ICD-10-CM

## 2013-04-02 DIAGNOSIS — F1020 Alcohol dependence, uncomplicated: Secondary | ICD-10-CM | POA: Diagnosis present

## 2013-04-02 DIAGNOSIS — Z880 Allergy status to penicillin: Secondary | ICD-10-CM

## 2013-04-02 DIAGNOSIS — Z7982 Long term (current) use of aspirin: Secondary | ICD-10-CM

## 2013-04-02 DIAGNOSIS — Z79899 Other long term (current) drug therapy: Secondary | ICD-10-CM

## 2013-04-02 LAB — BASIC METABOLIC PANEL
CO2: 22 mEq/L (ref 19–32)
Calcium: 9.3 mg/dL (ref 8.4–10.5)
Creatinine, Ser: 1.02 mg/dL (ref 0.50–1.35)
GFR calc non Af Amer: 77 mL/min — ABNORMAL LOW (ref >90)
Glucose, Bld: 151 mg/dL — ABNORMAL HIGH (ref 70–99)
Sodium: 137 mEq/L (ref 135–145)

## 2013-04-02 LAB — APTT: aPTT: 33 seconds (ref 24–37)

## 2013-04-02 LAB — CBC
Hemoglobin: 15.4 g/dL (ref 13.0–17.0)
MCH: 33.3 pg (ref 26.0–34.0)
MCV: 93.9 fL (ref 78.0–100.0)
Platelets: 213 10*3/uL (ref 150–400)
RDW: 12.7 % (ref 11.5–15.5)

## 2013-04-02 LAB — PROTIME-INR: INR: 1.16 (ref 0.00–1.49)

## 2013-04-02 LAB — TROPONIN I: Troponin I: <0.3 ng/mL (ref <0.30)

## 2013-04-02 MED ORDER — BUPROPION HCL ER (XL) 300 MG PO TB24
600.0000 mg | ORAL_TABLET | Freq: Every day | ORAL | Status: DC
  Administered 2013-04-03 – 2013-04-04 (×2): 600 mg via ORAL
  Filled 2013-04-02 (×2): qty 2

## 2013-04-02 MED ORDER — LORAZEPAM 1 MG PO TABS
0.0000 mg | ORAL_TABLET | Freq: Four times a day (QID) | ORAL | Status: DC
  Administered 2013-04-02 – 2013-04-04 (×4): 1 mg via ORAL
  Filled 2013-04-02 (×3): qty 1

## 2013-04-02 MED ORDER — VITAMIN B-1 100 MG PO TABS
100.0000 mg | ORAL_TABLET | Freq: Every day | ORAL | Status: DC
  Administered 2013-04-03 – 2013-04-04 (×2): 100 mg via ORAL
  Filled 2013-04-02 (×2): qty 1

## 2013-04-02 MED ORDER — FEBUXOSTAT 40 MG PO TABS
80.0000 mg | ORAL_TABLET | Freq: Every day | ORAL | Status: DC
  Administered 2013-04-03 – 2013-04-04 (×2): 80 mg via ORAL
  Filled 2013-04-02 (×2): qty 2

## 2013-04-02 MED ORDER — ADULT MULTIVITAMIN W/MINERALS CH
1.0000 | ORAL_TABLET | Freq: Every day | ORAL | Status: DC
  Administered 2013-04-03 – 2013-04-04 (×2): 1 via ORAL
  Filled 2013-04-02 (×2): qty 1

## 2013-04-02 MED ORDER — DILTIAZEM HCL 100 MG IV SOLR
5.0000 mg/h | INTRAVENOUS | Status: DC
  Administered 2013-04-02: 5 mg/h via INTRAVENOUS
  Filled 2013-04-02: qty 100

## 2013-04-02 MED ORDER — TAMSULOSIN HCL 0.4 MG PO CAPS
0.4000 mg | ORAL_CAPSULE | Freq: Every day | ORAL | Status: DC
  Administered 2013-04-03: 0.4 mg via ORAL
  Filled 2013-04-02 (×2): qty 1

## 2013-04-02 MED ORDER — SODIUM CHLORIDE 0.9 % IV SOLN: INTRAVENOUS | Status: DC

## 2013-04-02 MED ORDER — ENOXAPARIN SODIUM 40 MG/0.4ML ~~LOC~~ SOLN
40.0000 mg | SUBCUTANEOUS | Status: DC
  Administered 2013-04-03 – 2013-04-04 (×2): 40 mg via SUBCUTANEOUS
  Filled 2013-04-02 (×2): qty 0.4

## 2013-04-02 MED ORDER — NITROGLYCERIN 0.4 MG SL SUBL: 0.4000 mg | SUBLINGUAL_TABLET | SUBLINGUAL | Status: DC | PRN

## 2013-04-02 MED ORDER — FOLIC ACID 1 MG PO TABS
1.0000 mg | ORAL_TABLET | Freq: Every day | ORAL | Status: DC
  Administered 2013-04-03 – 2013-04-04 (×2): 1 mg via ORAL
  Filled 2013-04-02 (×2): qty 1

## 2013-04-02 MED ORDER — LORAZEPAM 2 MG/ML IJ SOLN: 1.0000 mg | Freq: Four times a day (QID) | INTRAMUSCULAR | Status: DC | PRN

## 2013-04-02 MED ORDER — SERTRALINE HCL 50 MG PO TABS
50.0000 mg | ORAL_TABLET | Freq: Every day | ORAL | Status: DC
  Administered 2013-04-03 – 2013-04-04 (×2): 50 mg via ORAL
  Filled 2013-04-02 (×2): qty 1

## 2013-04-02 MED ORDER — DILTIAZEM HCL 25 MG/5ML IV SOLN
15.0000 mg | Freq: Once | INTRAVENOUS | Status: AC
  Administered 2013-04-02: 15 mg via INTRAVENOUS

## 2013-04-02 MED ORDER — ASPIRIN EC 325 MG PO TBEC: 650.0000 mg | DELAYED_RELEASE_TABLET | Freq: Every day | ORAL | Status: DC | PRN

## 2013-04-02 MED ORDER — DILTIAZEM HCL 100 MG IV SOLR
5.0000 mg/h | INTRAVENOUS | Status: DC
  Administered 2013-04-03: 10 mg/h via INTRAVENOUS
  Filled 2013-04-02: qty 100

## 2013-04-02 MED ORDER — LORAZEPAM 1 MG PO TABS
1.0000 mg | ORAL_TABLET | Freq: Four times a day (QID) | ORAL | Status: DC | PRN
  Filled 2013-04-02: qty 1

## 2013-04-02 MED ORDER — THIAMINE HCL 100 MG/ML IJ SOLN
Freq: Once | INTRAVENOUS | Status: AC
  Administered 2013-04-03: via INTRAVENOUS
  Filled 2013-04-02: qty 1000

## 2013-04-02 MED ORDER — OFF THE BEAT BOOK
Freq: Once | Status: AC
  Administered 2013-04-03
  Filled 2013-04-02: qty 1

## 2013-04-02 MED ORDER — THIAMINE HCL 100 MG/ML IJ SOLN
100.0000 mg | Freq: Every day | INTRAMUSCULAR | Status: DC
  Filled 2013-04-02 (×2): qty 1

## 2013-04-02 MED ORDER — GEMFIBROZIL 600 MG PO TABS
600.0000 mg | ORAL_TABLET | Freq: Two times a day (BID) | ORAL | Status: DC
  Administered 2013-04-03 – 2013-04-04 (×4): 600 mg via ORAL
  Filled 2013-04-02 (×5): qty 1

## 2013-04-02 MED ORDER — LORAZEPAM 1 MG PO TABS
0.0000 mg | ORAL_TABLET | Freq: Two times a day (BID) | ORAL | Status: DC
Start: 2013-04-04 — End: 2013-04-04

## 2013-04-02 MED ORDER — SODIUM CHLORIDE 0.9 % IJ SOLN
3.0000 mL | Freq: Two times a day (BID) | INTRAMUSCULAR | Status: DC
  Administered 2013-04-03 (×2): 3 mL via INTRAVENOUS

## 2013-04-02 NOTE — ED Provider Notes (Signed)
CSN: [id number]     Arrival date & time 04/02/13  1731 History   First MD Initiated Contact with Patient 04/02/13 1808     Chief Complaint  Patient presents with  . Chest Pain   (Consider location/radiation/quality/duration/timing/severity/associated sxs/prior Treatment) Patient is a 62 y.o. male presenting with chest pain.  Chest Pain Associated symptoms: palpitations and shortness of breath   Associated symptoms: no abdominal pain, no cough, no fever, no headache, no nausea and not vomiting    Brandon Maddox is a 62 y.o. male who presents to the emergency department for chest pain and feeling that he may be in atrial fibrillation.  Patient reports that approximately 12 hours PTA he felt like his heart was quivering.  He was also feeling like something was sitting on his chest.  Described as 2/10.  Mild.  Central chest.  No radiation.  Accompanied by a little SOB.  He reports that in his 53s he had paroxysmal atrial fibrillation but hasn't really had this in the last several years.  He reports this feels like it did back then.    Past Medical History  Diagnosis Date  . Closed right scapular fracture   . Ribs, multiple fractures     displaced   Past Surgical History  Procedure Laterality Date  . Hip surgery  2010    DR. ROWAN  . Laceration repair  05/03/2012    Procedure: REPAIR MULTIPLE LACERATIONS;  Surgeon: Osborn Coho, MD;  Location: [facility] OR;  Service: ENT;  Laterality: N/A;   Family History  Problem Relation Age of Onset  . Diabetes Mother   . Diabetes Father    History  Substance Use Topics  . Smoking status: Former Smoker    Quit date: 04/20/2003  . Smokeless tobacco: Never Used  . Alcohol Use: No    Review of Systems  Constitutional: Negative for fever and chills.  HENT: Negative for congestion and sore throat.   Respiratory: Positive for shortness of breath. Negative for cough.   Cardiovascular: Positive for chest pain and palpitations.  Gastrointestinal:  Negative for nausea, vomiting, abdominal pain, diarrhea and constipation.  Endocrine: Negative for polyuria.  Genitourinary: Negative for dysuria and hematuria.  Musculoskeletal: Negative for neck pain.  Skin: Negative for rash.  Neurological: Negative for headaches.  Psychiatric/Behavioral: Negative.   All other systems reviewed and are negative.    Allergies  Penicillins  Home Medications   Current Outpatient Rx  Name  Route  Sig  Dispense  Refill  . aspirin EC 325 MG tablet   Oral   Take 650 mg by mouth daily as needed for mild pain.         Marland Kitchen buPROPion (WELLBUTRIN XL) 300 MG 24 hr tablet   Oral   Take 600 mg by mouth daily.         . Febuxostat (ULORIC) 80 MG TABS   Oral   Take 80 mg by mouth daily.          Marland Kitchen gemfibrozil (LOPID) 600 MG tablet   Oral   Take 600 mg by mouth 2 (two) times daily.          . sertraline (ZOLOFT) 50 MG tablet   Oral   Take 50 mg by mouth daily.         . Tamsulosin HCl (FLOMAX) 0.4 MG CAPS   Oral   Take 0.4 mg by mouth daily after supper.           BP 109/51  Pulse 148  Temp(Src) 98.6 F (37 C) (Oral)  Resp 14  SpO2 96% Physical Exam  Nursing note and vitals reviewed. Constitutional: He is oriented to person, place, and time. He appears well-developed and well-nourished. No distress.  HENT:  Head: Normocephalic and atraumatic.  Right Ear: External ear normal.  Left Ear: External ear normal.  Mouth/Throat: Oropharynx is clear and moist. No oropharyngeal exudate.  Eyes: Conjunctivae are normal. Pupils are equal, round, and reactive to light. Right eye exhibits no discharge.  Neck: Normal range of motion. Neck supple. No tracheal deviation present.  Cardiovascular: Intact distal pulses.  An irregularly irregular rhythm present. Tachycardia present.   Pulmonary/Chest: Effort normal. No respiratory distress. He has no wheezes. He has no rales.  Abdominal: Soft. He exhibits no distension. There is no tenderness. There  is no rebound and no guarding.  Musculoskeletal: Normal range of motion.  Neurological: He is alert and oriented to person, place, and time.  Skin: Skin is warm and dry. No rash noted. He is not diaphoretic.  Psychiatric: He has a normal mood and affect.    ED Course  Procedures (including critical care time) Labs Review Labs Reviewed  CBC  BASIC METABOLIC PANEL  TROPONIN I  POCT I-STAT TROPONIN I   Imaging Review No results found.  EKG Interpretation    Date/Time:  Monday April 02 2013 17:35:29 EST Ventricular Rate:  144 PR Interval:    QRS Duration: 84 QT Interval:  298 QTC Calculation: 461 R Axis:   23 Text Interpretation:  Atrial fibrillation with rapid ventricular response Possible Anterior infarct , age undetermined Abnormal ECG T wave inversions lateral and inferior Confirmed by ZAVITZ  MD, JOSHUA ([id number]) on 04/02/2013 6:00:55 PM            MDM  No diagnosis found.  Brandon Maddox is a 62 y.o. male who presents to the emergency department for concern of palpitations, CP, and SOB that feels similar to atrial fibrillation he had when he was much younger.  EKG with atrial fibrillation and RVR.  Cardizem bolus and drip initiated.  Pulse down to 100.  Medicine consulted for admission.  Patient safe for admission to telemetry.  No STEMI.  Patient admitted.    Arloa Koh, MD 04/03/13 [phone]

## 2013-04-02 NOTE — ED Notes (Signed)
Pt reports tightness in chest and back pain when lying flat since this am. States it is not painful just uncomfortable. Discomfort persisted throughout the day and then he became diaphoretic so he decided to come in for eval. Pt in afib now with no known history.

## 2013-04-02 NOTE — ED Notes (Signed)
MD at bedside. 

## 2013-04-02 NOTE — H&P (Signed)
@LOGO @ [facility] History and Physical  Patient: Brandon Maddox  WUJ:[id number]  DOB: 16-Oct-1950  DOS: the patient was seen and examined on 04/02/2013 PCP: Carylon Perches, MD  Chief Complaint: Palpitation and chest tightness  HPI: Brandon Maddox is a 62 y.o. male with Past medical history of fibrillation, alcohol abuse. The patient is coming from home. Patient presents with complaints of chest tightness that has been ongoing since last one week continuously. The pain remained the same regardless of activity or movement. Since this morning he started having complaints of palpitation. He has history of atrial fibrillation and he mentions symptoms of palpitation comments on and off but resolved on its own within a few hours and this time it continued since morning 4:00 until now. He mentions her status has improved significantly. He denies any shortness of breath, dizziness, lightheadedness, nausea, vomiting, diarrhea, burning urination, fever. He denies any change in his recent medications. He denies any smoking actively. He also mentions that he has been drinking half a gallon of scotch every 3 days. His last drink was this morning. He denies any orthopnea or PND  Review of Systems: as mentioned in the history of present illness.  A Comprehensive review of the other systems is negative.  Past Medical History  Diagnosis Date  . Closed right scapular fracture   . Ribs, multiple fractures     displaced   Past Surgical History  Procedure Laterality Date  . Hip surgery  2010    DR. ROWAN  . Laceration repair  05/03/2012    Procedure: REPAIR MULTIPLE LACERATIONS;  Surgeon: Osborn Coho, MD;  [facility];  Service: ENT;  Laterality: N/A;   Social History:  reports that he quit smoking about 9 years ago. He has never used smokeless tobacco. He reports that he does not drink alcohol. His drug history is not on file. Independent for most of his  ADL.  Allergies  Allergen Reactions   . Penicillins     Mother told him throat swelled up when he took at a young age     Family History  Problem Relation Age of Onset  . Diabetes Mother   . Diabetes Father     Prior to Admission medications   Medication Sig Start Date End Date Taking? Authorizing Provider  aspirin EC 325 MG tablet Take 650 mg by mouth daily as needed for mild pain.   Yes Historical Provider, MD  buPROPion (WELLBUTRIN XL) 300 MG 24 hr tablet Take 600 mg by mouth daily.   Yes Historical Provider, MD  Febuxostat (ULORIC) 80 MG TABS Take 80 mg by mouth daily.    Yes Historical Provider, MD  gemfibrozil (LOPID) 600 MG tablet Take 600 mg by mouth 2 (two) times daily.  12/22/10  Yes Historical Provider, MD  sertraline (ZOLOFT) 50 MG tablet Take 50 mg by mouth daily.   Yes Historical Provider, MD  Tamsulosin HCl (FLOMAX) 0.4 MG CAPS Take 0.4 mg by mouth daily after supper.    Yes Historical Provider, MD    Physical Exam: Filed Vitals:   04/02/13 2130 04/02/13 2200 04/02/13 2250 04/02/13 2302  BP: 141/77 126/72  137/68  Pulse: 102 100  113  Temp:    99.3 F (37.4 C)  TempSrc:    Oral  Resp:  20  18  Height:   5\' 11"  (1.803 m)   Weight:   100.472 kg (221 lb 8 oz)   SpO2: 94% 95%  95%    General: Alert,  Awake and Oriented to Time, Place and Person. Appear in mild distress Eyes: PERRL ENT: Oral Mucosa clear moist. Neck: no JVD Cardiovascular: S1 and S2 Present, no Murmur, Peripheral Pulses Present Respiratory: Bilateral Air entry equal and Decreased, Clear to Auscultation,  no Crackles,no wheezes Abdomen: Bowel Sound Present, Soft and Non tender Skin: no Rash Extremities: no Pedal edema, no calf tenderness Neurologic: Grossly Unremarkable.  Labs on Admission:  CBC:  Recent Labs Lab 04/02/13 1750  WBC 6.8  HGB 15.4  HCT 43.4  MCV 93.9  PLT 213    CMP     Component Value Date/Time   NA 137 04/02/2013 1750   K 3.9 04/02/2013 1750   CL 97 04/02/2013 1750   CO2 22 04/02/2013 1750    GLUCOSE 151* 04/02/2013 1750   BUN 20 04/02/2013 1750   CREATININE 1.02 04/02/2013 1750   CALCIUM 9.3 04/02/2013 1750   PROT 6.6 09/09/2008 1915   ALBUMIN 4.0 09/09/2008 1915   AST 32 09/09/2008 1915   ALT 27 09/09/2008 1915   ALKPHOS 73 09/09/2008 1915   BILITOT 1.0 09/09/2008 1915   GFRNONAA 77* 04/02/2013 1750   GFRAA 90* 04/02/2013 1750    No results found for this basename: LIPASE, AMYLASE,  in the last 168 hours No results found for this basename: AMMONIA,  in the last 168 hours   Recent Labs Lab 04/02/13 1855  TROPONINI <0.30   BNP (last 3 results) No results found for this basename: PROBNP,  in the last 8760 hours  Radiological Exams on Admission: Dg Chest 2 View  04/02/2013   CLINICAL DATA:  Chest pain  EXAM: CHEST  2 VIEW  COMPARISON:  03/08/2007; 12/30/2006; chest CT -01/01/2011  FINDINGS: Grossly unchanged borderline enlarged cardiac silhouette and mediastinal contours given reduced lung volumes. Evaluation of the retrosternal clear space obscured secondary to overlying soft tissues. Multiple old/healed bilateral posterior lateral rib fractures are re- demonstrated. Grossly unchanged pleural parenchymal thickening and architectural distortion about the peripheral aspect of the left mid and lower lung. Lung volumes remain reduced. No new focal airspace opacities. No pleural effusion or pneumothorax. No evidence of edema. Accentuated kyphosis of the lower thoracic spine, grossly unchanged.  IMPRESSION: 1. Persistently reduced lung volumes without acute cardiopulmonary disease. 2. Multiple old/healed bilateral posterior and lateral rib fractures with associated pleural parenchymal thickening and architectural distortion within the peripheral aspect of the left mid and lower lung.   Electronically Signed   By: Simonne Come M.D.   On: 04/02/2013 18:42    EKG: Independently reviewed. atrial fibrillation, RVR.  Assessment/Plan Principal Problem:   Atrial fibrillation with  RVR Active Problems:   Alcohol dependence   1. Atrial fibrillation with RVR The patient is presenting with fibrillation with aberrant in response. He still has been on Cardizem drip and currently in atrial fibrillation with rate control. At present I would continue him on Cardizem drip. Admitted to the hospital, follow telemetry, obtain echocardiogram, follow serial troponins. replace electrolytes. Possible etiology of atrial fibrillation is sleep apnea alcohol abuse or ACS.  2. Alcohol dependence I will place the patient on CIWA protocol to prevent alcohol withdrawal   DVT Prophylaxis: subcutaneous Heparin Nutrition: Cardiac diet  Code Status: Full  Family Communication: Wife was present at bedside, opportunity was given to ask question and all questions were answered satisfactorily at the time of interview. Disposition: Admitted to inpatient in step-down unit.  Author: Lynden Oxford, MD [facility] Pager: [phone] 04/02/2013, 11:19 PM    If  7PM-7AM, please contact night-coverage [email or web address] Password [email or web address]

## 2013-04-02 NOTE — ED Notes (Signed)
Admitting MD at bedside.

## 2013-04-03 DIAGNOSIS — F1020 Alcohol dependence, uncomplicated: Secondary | ICD-10-CM | POA: Diagnosis present

## 2013-04-03 DIAGNOSIS — I4891 Unspecified atrial fibrillation: Secondary | ICD-10-CM

## 2013-04-03 LAB — COMPREHENSIVE METABOLIC PANEL
ALT: 22 U/L (ref 0–53)
AST: 28 U/L (ref 0–37)
Albumin: 3.1 g/dL — ABNORMAL LOW (ref 3.5–5.2)
Calcium: 8.6 mg/dL (ref 8.4–10.5)
GFR calc Af Amer: >90 mL/min (ref >90)
Sodium: 135 mEq/L (ref 135–145)
Total Protein: 6.3 g/dL (ref 6.0–8.3)

## 2013-04-03 LAB — PROTIME-INR
INR: 1.24 (ref 0.00–1.49)
Prothrombin Time: 15.3 seconds — ABNORMAL HIGH (ref 11.6–15.2)

## 2013-04-03 LAB — CBC
HCT: 39.1 % (ref 39.0–52.0)
Hemoglobin: 13.6 g/dL (ref 13.0–17.0)
MCH: 32.9 pg (ref 26.0–34.0)
MCHC: 34.8 g/dL (ref 30.0–36.0)
RDW: 13 % (ref 11.5–15.5)

## 2013-04-03 LAB — TROPONIN I: Troponin I: <0.3 ng/mL (ref <0.30)

## 2013-04-03 LAB — TSH: TSH: 5.868 u[IU]/mL — ABNORMAL HIGH (ref 0.350–4.500)

## 2013-04-03 MED ORDER — METOPROLOL TARTRATE 25 MG PO TABS
25.0000 mg | ORAL_TABLET | Freq: Two times a day (BID) | ORAL | Status: DC
  Administered 2013-04-03 – 2013-04-04 (×3): 25 mg via ORAL
  Filled 2013-04-03 (×4): qty 1

## 2013-04-03 MED ORDER — ASPIRIN EC 81 MG PO TBEC
81.0000 mg | DELAYED_RELEASE_TABLET | Freq: Every day | ORAL | Status: DC
  Administered 2013-04-03 – 2013-04-04 (×2): 81 mg via ORAL
  Filled 2013-04-03 (×2): qty 1

## 2013-04-03 MED ORDER — INFLUENZA VAC SPLIT QUAD 0.5 ML IM SUSP
0.5000 mL | INTRAMUSCULAR | Status: AC
  Administered 2013-04-04: 0.5 mL via INTRAMUSCULAR
  Filled 2013-04-03: qty 0.5

## 2013-04-03 NOTE — Care Management Note (Unsigned)
    Page 1 of 1   04/03/2013     3:30:01 PM   CARE MANAGEMENT NOTE 04/03/2013  Patient:  Brandon Maddox, Brandon Maddox   Account Number:  [id number]  Date Initiated:  04/03/2013  Documentation initiated by:  Brooke Payes  Subjective/Objective Assessment:   PT ADM ON 12/15 WITH ATRIAL FIBRILLATION.  PTA, PT INDEPENDENT, LIVES ALONE.     Action/Plan:   WILL FOLLOW FOR DC NEEDS AS PT PROGRESSES.   Anticipated DC Date:  04/05/2013   Anticipated DC Plan:  HOME/SELF CARE      DC Planning Services  CM consult      Choice offered to / List presented to:             Status of service:  In process, will continue to follow Medicare Important Message given?   (If response is "NO", the following Medicare IM given date fields will be blank) Date Medicare IM given:   Date Additional Medicare IM given:    Discharge Disposition:    Per UR Regulation:  Reviewed for med. necessity/level of care/duration of stay  If discussed at Long Length of Stay Meetings, dates discussed:    Comments:

## 2013-04-03 NOTE — Progress Notes (Signed)
Utilization Review Completed.Brandon Maddox T12/16/2014  

## 2013-04-03 NOTE — ED Provider Notes (Signed)
Medical screening examination/treatment/procedure(s) were conducted as a shared visit with non-physician practitioner(s) or resident  and myself.  I personally evaluated the patient during the encounter and agree with the findings and plan unless otherwise indicated.    I have personally reviewed any xrays and/ or EKG's with the provider and I agree with interpretation.   New onset a fib, mild chest tightness improved as rate improved.  Pt is a regular etoh user, daily.  A fib years ago, not on meds or blood thinners.  No other cardiac hx.  Exam tachy, irregular, no distress, well appearing, no leg pain/ swelling, lungs clear.  Diltiazem adjusted for HR.  Medical screening examination/treatment/procedure(s) were performed by non-physician practitioner and as supervising physician I was immediately available for consultation/collaboration.  EKG Interpretation    Date/Time:  Monday April 02 2013 17:35:29 EST Ventricular Rate:  144 PR Interval:    QRS Duration: 84 QT Interval:  298 QTC Calculation: 461 R Axis:   23 Text Interpretation:  Atrial fibrillation with rapid ventricular response Possible Anterior infarct , age undetermined Abnormal ECG T wave inversions lateral and inferior Confirmed by Cecille Mcclusky  MD, Damarius Karnes ([id number]) on 04/02/2013 6:00:55 PM           \  A fib RVR, Chest pain   Enid Skeens, MD 04/03/13 [phone]

## 2013-04-03 NOTE — Progress Notes (Signed)
[facility] PROGRESS NOTE  Brandon Maddox ZOX:[id number] DOB: 03-Dec-1950 DOA: 04/02/2013 PCP: Brandon Perches, MD  Assessment/Plan:  62 y/o with chronic atrial fibrillation, alcoholism presented presented with palpitation, and admitted with a fib RVR  1. A fib RVR; HR improving on IV Cardizem;  -transition to BB; start ASA; check TSH; pend echo; patient has PAF for 40 years; low risk with chads pend Ha1c  2. Alcoholism, no s/s of withdrawal; cont CIWA   Code Status: full Family Communication: d/w patient  (indicate person spoken with, relationship, and if by phone, the number) Disposition Plan: home 24-48 hours    Consultants:  None   Procedures:  None   Antibiotics:  None  (indicate start date, and stop date if known)  HPI/Subjective: alert  Objective: Filed Vitals:   04/03/13 0506  BP: 131/57  Pulse: 99  Temp: 98.4 F (36.9 C)  Resp: 18    Intake/Output Summary (Last 24 hours) at 04/03/13 1006 Last data filed at 04/03/13 0700  Gross per 24 hour  Intake    360 ml  Output    250 ml  Net    110 ml   Filed Weights   04/02/13 2250 04/03/13 0506  Weight: 100.472 kg (221 lb 8 oz) 98.6 kg (217 lb 6 oz)    Exam:   General:  alert  Cardiovascular: s1,s2 irregular   Respiratory: CTA BL  Abdomen: soft, nt, nd   Musculoskeletal: no LE edema   Data Reviewed: Basic Metabolic Panel:  Recent Labs Lab 04/02/13 1750 04/03/13 0350  NA 137 135  K 3.9 3.5  CL 97 99  CO2 22 24  GLUCOSE 151* 121*  BUN 20 17  CREATININE 1.02 0.83  CALCIUM 9.3 8.6   Liver Function Tests:  Recent Labs Lab 04/03/13 0350  AST 28  ALT 22  ALKPHOS 72  BILITOT 0.5  PROT 6.3  ALBUMIN 3.1*   No results found for this basename: LIPASE, AMYLASE,  in the last 168 hours No results found for this basename: AMMONIA,  in the last 168 hours CBC:  Recent Labs Lab 04/02/13 1750 04/03/13 0350  WBC 6.8 6.7  HGB 15.4 13.6  HCT 43.4 39.1  MCV 93.9 94.7  PLT 213 169    Cardiac Enzymes:  Recent Labs Lab 04/02/13 1855 04/02/13 2350 04/03/13 0350  TROPONINI <0.30 <0.30 <0.30   BNP (last 3 results) No results found for this basename: PROBNP,  in the last 8760 hours CBG: No results found for this basename: GLUCAP,  in the last 168 hours  No results found for this or any previous visit (from the past 240 hour(s)).   Studies: Dg Chest 2 View  04/02/2013   CLINICAL DATA:  Chest pain  EXAM: CHEST  2 VIEW  COMPARISON:  03/08/2007; 12/30/2006; chest CT -01/01/2011  FINDINGS: Grossly unchanged borderline enlarged cardiac silhouette and mediastinal contours given reduced lung volumes. Evaluation of the retrosternal clear space obscured secondary to overlying soft tissues. Multiple old/healed bilateral posterior lateral rib fractures are re- demonstrated. Grossly unchanged pleural parenchymal thickening and architectural distortion about the peripheral aspect of the left mid and lower lung. Lung volumes remain reduced. No new focal airspace opacities. No pleural effusion or pneumothorax. No evidence of edema. Accentuated kyphosis of the lower thoracic spine, grossly unchanged.  IMPRESSION: 1. Persistently reduced lung volumes without acute cardiopulmonary disease. 2. Multiple old/healed bilateral posterior and lateral rib fractures with associated pleural parenchymal thickening and architectural distortion within the peripheral aspect of the left  mid and lower lung.   Electronically Signed   By: Simonne Come M.D.   On: 04/02/2013 18:42    Scheduled Meds: . buPROPion  600 mg Oral Daily  . enoxaparin (LOVENOX) injection  40 mg Subcutaneous Q24H  . febuxostat  80 mg Oral Daily  . folic acid  1 mg Oral Daily  . gemfibrozil  600 mg Oral BID  . [START ON 04/04/2013] influenza vac split quadrivalent PF  0.5 mL Intramuscular Tomorrow-1000  . LORazepam  0-4 mg Oral Q6H   Followed by  . [START ON 04/04/2013] LORazepam  0-4 mg Oral Q12H  . multivitamin with minerals  1  tablet Oral Daily  . sertraline  50 mg Oral Daily  . sodium chloride  3 mL Intravenous Q12H  . tamsulosin  0.4 mg Oral QPC supper  . thiamine  100 mg Oral Daily   Or  . thiamine  100 mg Intravenous Daily   Continuous Infusions: . diltiazem (CARDIZEM) infusion 10 mg/hr (04/03/13 0514)    Principal Problem:   Atrial fibrillation with RVR Active Problems:   Alcohol dependence    Time spent: >35 minutes     Brandon Maddox  [facility] Pager [phone]. If 7PM-7AM, please contact night-coverage at [email or web address], password [facility] 04/03/2013, 10:06 AM  LOS: 1 day

## 2013-04-04 MED ORDER — METOPROLOL TARTRATE 25 MG PO TABS: 25.0000 mg | ORAL_TABLET | Freq: Two times a day (BID) | ORAL | Status: DC

## 2013-04-04 MED ORDER — FOLIC ACID 1 MG PO TABS: 1.0000 mg | ORAL_TABLET | Freq: Every day | ORAL | Status: DC

## 2013-04-04 MED ORDER — ADULT MULTIVITAMIN W/MINERALS CH: 1.0000 | ORAL_TABLET | Freq: Every day | ORAL | Status: DC

## 2013-04-04 MED ORDER — ASPIRIN EC 325 MG PO TBEC: 325.0000 mg | DELAYED_RELEASE_TABLET | Freq: Every day | ORAL | Status: DC

## 2013-04-04 NOTE — Plan of Care (Signed)
Problem: Phase II Progression Outcomes Goal: Anticoagulation Therapy per MD order Outcome: Completed/Met Date Met:  04/04/13 ASA

## 2013-04-05 NOTE — Progress Notes (Signed)
  Echocardiogram 2D Echocardiogram has been performed.  Andrey Hoobler 04/05/2013, 7:20 AM

## 2013-04-24 NOTE — Discharge Summary (Signed)
Physician Discharge Summary  Brandon Maddox NLG:[id number] DOB: 05/16/1950 DOA: 04/02/2013  PCP: [name], MD  Admit date: 04/02/2013 Discharge date: 04/04/2013  Time spent: >30 minutes  Recommendations for Outpatient Follow-up:  Follow-up Information   Follow up with [name], MD. (in 1week, call for appt upon discharge)    Specialty:  Internal Medicine   Contact information:   [address] [place] [address] [phone]        Discharge Diagnoses:  Principal Problem:   Atrial fibrillation with RVR Active Problems:   Alcohol dependence   Discharge Condition: improved/stable  Diet recommendation: heart healthy  Filed Weights   04/02/13 2250 04/03/13 0506 04/04/13 0644  Weight: 100.472 kg (221 lb 8 oz) 98.6 kg (217 lb 6 oz) 98.7 kg (217 lb 9.5 oz)    History of present illness:  Brandon Maddox is a 63 y.o. male with Past medical history of fibrillation, alcohol abuse.  The patient is coming from home.  Patient presents with complaints of chest tightness that has been ongoing since last one week continuously. The pain remained the same regardless of activity or movement. Since this morning he started having complaints of palpitation. He has history of atrial fibrillation and he mentions symptoms of palpitation comments on and off but resolved on its own within a few hours and this time it continued since morning 4:00 until now. He mentions her status has improved significantly. He denies any shortness of breath, dizziness, lightheadedness, nausea, vomiting, diarrhea, burning urination, fever.  He denies any change in his recent medications.  He denies any smoking actively.  He also mentions that he has been drinking half a gallon of scotch every 3 days. His last drink was this morning. He denies any orthopnea or PND   Hospital Course:  1. A fib RVR;  As discussed above on admission pt was started on IV Cardizem; HR controlled and on follow up he  was  transitioned to BB. TSH/free T4 and was consistent with subclinical hypothyroidism>> Pt instructed to follow up with PCP for repeat TFTs in 4-6wks. He was placed on ASA- low risk with chads pend Ha1c. ECHO was done and showed an EF of 55-60%.His rate remained controlled on metoprolol and he was counseled to quit alcohol and follow up with his PCP 2. Alcoholism, no s/s of withdrawal; He was treated with ativan per CIWA protocal and was counseled to quit alcohol.     Procedures:  Echo Study Conclusions  - Left ventricle: The cavity size was normal. There was hypertrophy, with an appearance suggesting concentric remodeling (increased wall thickness with normal wall mass). Systolic function was normal. The estimated ejection fraction was in the range of 55% to 60%. Although no diagnostic regional wall motion abnormality was identified, this possibility cannot be completely excluded on the basis of this study. - Ventricular septum: Septal motion showed paradox and "bounce". - Left atrium: The atrium was mildly dilated. Impressions:  - An atrial fibrillation rhythm was noted on this study, making this study technically difficult for the assessment of cardiac function.    Consultations:  None   Discharge Exam: Filed Vitals:   04/04/13 0644  BP: 116/78  Pulse: 82  Temp: 98.2 F (36.8 C)  Resp: 19   Exam:  General: alert and oriented x3 Cardiovascular: s1,s2 irregular, rate controlled Respiratory: CTA BL  Abdomen: soft, nt, nd  Musculoskeletal: no LE edema     Discharge Instructions  Discharge Orders   Future Orders  Complete By Expires   Diet - low sodium heart healthy  As directed    Discharge instructions  As directed    Comments:     QUIT alcohol   Increase activity slowly  As directed        Medication List         aspirin EC 325 MG tablet  Take 1 tablet (325 mg total) by mouth daily.     buPROPion 300 MG 24 hr tablet  Commonly known as:   WELLBUTRIN XL  Take 600 mg by mouth daily.     folic acid 1 MG tablet  Commonly known as:  FOLVITE  Take 1 tablet (1 mg total) by mouth daily.     gemfibrozil 600 MG tablet  Commonly known as:  LOPID  Take 600 mg by mouth 2 (two) times daily.     metoprolol tartrate 25 MG tablet  Commonly known as:  LOPRESSOR  Take 1 tablet (25 mg total) by mouth 2 (two) times daily.     multivitamin with minerals Tabs tablet  Take 1 tablet by mouth daily.     sertraline 50 MG tablet  Commonly known as:  ZOLOFT  Take 50 mg by mouth daily.     tamsulosin 0.4 MG Caps capsule  Commonly known as:  FLOMAX  Take 0.4 mg by mouth daily after supper.     ULORIC 80 MG Tabs  Generic drug:  Febuxostat  Take 80 mg by mouth daily.       Allergies  Allergen Reactions  . Penicillins     Mother told him throat swelled up when he took at a young age        Follow-up Information   Follow up with [name], MD. (in 1week, call for appt upon discharge)    Specialty:  Internal Medicine   Contact information:   [address] [address] [place] [address] [phone]        The results of significant diagnostics from this hospitalization (including imaging, microbiology, ancillary and laboratory) are listed below for reference.    Significant Diagnostic Studies: Dg Chest 2 View  04/02/2013   CLINICAL DATA:  Chest pain  EXAM: CHEST  2 VIEW  COMPARISON:  03/08/2007; 12/30/2006; chest CT -01/01/2011  FINDINGS: Grossly unchanged borderline enlarged cardiac silhouette and mediastinal contours given reduced lung volumes. Evaluation of the retrosternal clear space obscured secondary to overlying soft tissues. Multiple old/healed bilateral posterior lateral rib fractures are re- demonstrated. Grossly unchanged pleural parenchymal thickening and architectural distortion about the peripheral aspect of the left mid and lower lung. Lung volumes remain reduced. No new focal airspace opacities. No  pleural effusion or pneumothorax. No evidence of edema. Accentuated kyphosis of the lower thoracic spine, grossly unchanged.  IMPRESSION: 1. Persistently reduced lung volumes without acute cardiopulmonary disease. 2. Multiple old/healed bilateral posterior and lateral rib fractures with associated pleural parenchymal thickening and architectural distortion within the peripheral aspect of the left mid and lower lung.   Electronically Signed   By: Sandi Mariscal M.D.   On: 04/02/2013 18:42    Microbiology: No results found for this or any previous visit (from the past 240 hour(s)).   Labs: Basic Metabolic Panel:  Recent Labs Lab 04/02/13 1750 04/03/13 0350  NA 137 135  K 3.9 3.5  CL 97 99  CO2 22 24  GLUCOSE 151* 121*  BUN 20 17  CREATININE 1.02 0.83  CALCIUM 9.3 8.6   Liver Function Tests:  Recent Labs Lab 04/03/13 0350  AST 28  ALT 22  ALKPHOS 72  BILITOT 0.5  PROT 6.3  ALBUMIN 3.1*   No results found for this basename: LIPASE, AMYLASE,  in the last 168 hours No results found for this basename: AMMONIA,  in the last 168 hours CBC:  Recent Labs Lab 04/02/13 1750 04/03/13 0350  WBC 6.8 6.7  HGB 15.4 13.6  HCT 43.4 39.1  MCV 93.9 94.7  PLT 213 169   Cardiac Enzymes:  Recent Labs Lab 04/02/13 1855 04/02/13 2350 04/03/13 0350 04/03/13 1115  TROPONINI <0.30 <0.30 <0.30 <0.30   BNP: BNP (last 3 results) No results found for this basename: PROBNP,  in the last 8760 hours CBG: No results found for this basename: GLUCAP,  in the last 168 hours     Signed:  Sheila Oats  [facility] 04/04/2013, 12:16 PM

## 2014-06-11 ENCOUNTER — Other Ambulatory Visit (HOSPITAL_COMMUNITY): Payer: Self-pay | Admitting: Orthopedic Surgery

## 2014-06-11 DIAGNOSIS — T84030A Mechanical loosening of internal right hip prosthetic joint, initial encounter: Secondary | ICD-10-CM

## 2014-06-25 ENCOUNTER — Ambulatory Visit (HOSPITAL_COMMUNITY)
Admission: RE | Admit: 2014-06-25 | Discharge: 2014-06-25 | Disposition: A | Discharge status: Home / Self Care | Payer: BLUE CROSS/BLUE SHIELD | Source: Ambulatory Visit | Attending: Orthopedic Surgery | Admitting: Orthopedic Surgery

## 2014-06-25 DIAGNOSIS — S73192A Other sprain of left hip, initial encounter: Secondary | ICD-10-CM | POA: Diagnosis not present

## 2014-06-25 DIAGNOSIS — M25451 Effusion, right hip: Secondary | ICD-10-CM | POA: Diagnosis not present

## 2014-06-25 DIAGNOSIS — K5730 Diverticulosis of large intestine without perforation or abscess without bleeding: Secondary | ICD-10-CM | POA: Diagnosis not present

## 2014-06-25 DIAGNOSIS — T84030A Mechanical loosening of internal right hip prosthetic joint, initial encounter: Secondary | ICD-10-CM

## 2014-06-25 DIAGNOSIS — K4020 Bilateral inguinal hernia, without obstruction or gangrene, not specified as recurrent: Secondary | ICD-10-CM | POA: Insufficient documentation

## 2014-06-25 DIAGNOSIS — X58XXXA Exposure to other specified factors, initial encounter: Secondary | ICD-10-CM | POA: Diagnosis not present

## 2014-06-25 DIAGNOSIS — Z96641 Presence of right artificial hip joint: Secondary | ICD-10-CM | POA: Diagnosis not present

## 2014-06-25 DIAGNOSIS — M25551 Pain in right hip: Secondary | ICD-10-CM | POA: Insufficient documentation

## 2015-01-14 DIAGNOSIS — E291 Testicular hypofunction: Secondary | ICD-10-CM | POA: Insufficient documentation

## 2015-08-05 ENCOUNTER — Telehealth: Payer: Self-pay | Admitting: Internal Medicine

## 2015-08-05 NOTE — Telephone Encounter (Signed)
MAY RECALL FOR TCS °

## 2015-08-11 NOTE — Telephone Encounter (Signed)
Letter mailed

## 2015-08-27 ENCOUNTER — Ambulatory Visit (INDEPENDENT_AMBULATORY_CARE_PROVIDER_SITE_OTHER): Payer: BLUE CROSS/BLUE SHIELD | Admitting: Cardiology

## 2015-08-27 ENCOUNTER — Encounter: Payer: Self-pay | Admitting: Cardiology

## 2015-08-27 VITALS — BP 112/70 | HR 75 | Ht 71.0 in | Wt 213.0 lb

## 2015-08-27 DIAGNOSIS — I4819 Other persistent atrial fibrillation: Secondary | ICD-10-CM

## 2015-08-27 DIAGNOSIS — I481 Persistent atrial fibrillation: Secondary | ICD-10-CM

## 2015-08-27 MED ORDER — METOPROLOL SUCCINATE ER 25 MG PO TB24: 25.0000 mg | ORAL_TABLET | Freq: Every day | ORAL | Status: AC

## 2015-08-27 NOTE — Progress Notes (Signed)
Cardiology Office Note  Date: 08/27/2015   ID: GALDINO TWISDALE, DOB [birth year], MRN [id number]  PCP: [name], MD  Consulting Cardiologist: Rozann Lesches, MD   Chief Complaint  Patient presents with  . Atrial Fibrillation    History of Present Illness: Brandon Maddox is a 65 y.o. male referred for cardiology consultation by Dr. Willey Blade. I reviewed his records, history indicates diagnosis of paroxysmal atrial fibrillation since his 3s. He has been followed over time for this, on aspirin at this point and otherwise no heart rate control medications. He was recently noted to be in atrial fibrillation by ECG in April at a routine office visit.  CHADSVASC score is 0 at this time. In the past he had been taking Lopressor based on chart review. He is aware of palpitations only occasionally. He had an echocardiogram done back in 2013 as outlined below.  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 He has worked there since he was 65 years old. 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  Past Medical History  Diagnosis Date  . Closed right scapular fracture   . Ribs, multiple fractures   . Gout   . Depression   . Low testosterone   . GERD (gastroesophageal reflux disease)   . History of alcohol abuse     Past Surgical History  Procedure Laterality Date  . Hip surgery  2010    Dr. Mayer Camel  . Laceration repair  05/03/2012    Procedure: REPAIR MULTIPLE LACERATIONS;  Surgeon: Jerrell Belfast, MD;  Location: [facility];  Service: ENT;  Laterality: N/A;    Current Outpatient Prescriptions  Medication Sig Dispense Refill  . allopurinol (ZYLOPRIM) 300 MG tablet     . aspirin EC 325 MG tablet Take 1 tablet (325 mg total) by mouth daily. 30 tablet 0  . Multiple Vitamin (MULTIVITAMIN WITH MINERALS) TABS tablet Take 1 tablet by mouth daily. 30 tablet 0  . sertraline (ZOLOFT) 50 MG tablet Take 50 mg by mouth daily.    . Tamsulosin HCl (FLOMAX) 0.4 MG CAPS Take 0.4 mg by  mouth daily after supper.     Marland Kitchen buPROPion (WELLBUTRIN XL) 150 MG 24 hr tablet 450 mg.   4  . metoprolol succinate (TOPROL XL) 25 MG 24 hr tablet Take 1 tablet (25 mg total) by mouth daily. 90 tablet 3   No current facility-administered medications for this visit.   Allergies:  Penicillins   Social History: The patient  reports that he quit smoking about 12 years ago. His smoking use included Cigarettes. He has never used smokeless tobacco. He reports that he drinks alcohol.   Family History: The patient's family history includes Diabetes in his father and mother.   ROS:  Please see the history of present illness. Otherwise, complete review of systems is positive for none.  All other systems are reviewed and negative.   Physical Exam: VS:  BP 112/70 mmHg  Pulse 75  Ht 5\' 11"  (1.803 m)  Wt 213 lb (96.616 kg)  BMI 29.72 kg/m2  SpO2 95%, BMI Body mass index is 29.72 kg/(m^2).  Wt Readings from Last 3 Encounters:  08/27/15 213 lb (96.616 kg)  04/04/13 217 lb 9.5 oz (98.7 kg)  05/03/12 220 lb (99.791 kg)    General: Overweight male, appears comfortable at rest. HEENT: Conjunctiva and lids normal, oropharynx clear. Neck: Supple, no elevated JVP or carotid bruits, no thyromegaly. Lungs: Clear to auscultation, nonlabored breathing at  rest. Cardiac: Irregularly irregular, no S3 or significant systolic murmur, no pericardial rub. Abdomen: Soft, nontender, bowel sounds present, no guarding or rebound. Extremities: No pitting edema, distal pulses 2+. Skin: Warm and dry. Musculoskeletal: No kyphosis. Neuropsychiatric: Alert and oriented x3, affect grossly appropriate.  ECG: I personally reviewed the tracing from 08/06/2015 which showed atrial fibrillation at 100 bpm with poor R wave progression and nonspecific T-wave changes. Previous tracing in 2013 showed normal sinus rhythm with possible left atrial enlargement and nonspecific ST segment changes..  Recent Labwork:  April 2017: Uric acid  4.9, testosterone 814  Other Studies Reviewed Today:  Echocardiogram 04/03/2013: Study Conclusions  - Left ventricle: The cavity size was normal. There was hypertrophy, with an appearance suggesting concentric remodeling (increased wall thickness with normal wall mass). Systolic function was normal. The estimated ejection fraction was in the range of 55% to 60%. Although no diagnostic regional wall motion abnormality was identified, this possibility cannot be completely excluded on the basis of this study. - Ventricular septum: Septal motion showed paradox and "bounce". - Left atrium: The atrium was mildly dilated. Impressions:  - An atrial fibrillation rhythm was noted on this study, making this study technically difficult for the assessment of cardiac function.  Assessment and Plan:  Paroxysmal to persistent atrial fibrillation, long-standing history with low thromboembolic risk score at this time. Agree with continuing aspirin for now. We will add Toprol-XL 25 mg daily and follow-up with an echocardiogram. Further management options to be considered depending on now he does symptomatically. He does not appear to be overly bothered by palpitations at this time.  Current medicines were reviewed with the patient today.   Orders Placed This Encounter  Procedures  . Echocardiogram    Disposition: FU with me in 6 months.   Signed, Satira Sark, MD, [facility] 08/27/2015 10:38 AM    [facility] [place]. [address] [place], [place] [address] Phone: [phone]; Fax: [phone]

## 2015-08-27 NOTE — Patient Instructions (Addendum)
Medication Instructions:  Start  METOPROLOL 25 MG ONCE DAILY   Labwork: none  Testing/Procedures: Your physician has requested that you have an echocardiogram. Echocardiography is a painless test that uses sound waves to create images of your heart. It provides your doctor with information about the size and shape of your heart and how well your heart's chambers and valves are working. This procedure takes approximately one hour. There are no restrictions for this procedure.    Follow-Up: Your physician wants you to follow-up in: 6 months .  You will receive a reminder letter in the mail two months in advance. If you don't receive a letter, please call our office to schedule the follow-up appointment.   Any Other Special Instructions Will Be Listed Below (If Applicable).     If you need a refill on your cardiac medications before your next appointment, please call your pharmacy.

## 2015-09-02 ENCOUNTER — Ambulatory Visit (HOSPITAL_COMMUNITY): Admission: RE | Admit: 2015-09-02 | Payer: BLUE CROSS/BLUE SHIELD | Source: Ambulatory Visit

## 2015-09-10 ENCOUNTER — Telehealth: Payer: Self-pay

## 2015-09-10 NOTE — Telephone Encounter (Signed)
Called pt to remind him that his dr. Rosanna Randy him to have an echo, and he says he will call us when he is able to schedule it.

## 2015-12-04 ENCOUNTER — Other Ambulatory Visit: Payer: Self-pay

## 2015-12-04 DIAGNOSIS — I4891 Unspecified atrial fibrillation: Secondary | ICD-10-CM

## 2015-12-16 ENCOUNTER — Ambulatory Visit (HOSPITAL_COMMUNITY)
Admission: RE | Admit: 2015-12-16 | Discharge: 2015-12-16 | Disposition: A | Discharge status: Home / Self Care | Payer: BLUE CROSS/BLUE SHIELD | Source: Ambulatory Visit | Attending: Cardiology | Admitting: Cardiology

## 2015-12-16 DIAGNOSIS — I4891 Unspecified atrial fibrillation: Secondary | ICD-10-CM | POA: Diagnosis not present

## 2015-12-16 DIAGNOSIS — I517 Cardiomegaly: Secondary | ICD-10-CM | POA: Insufficient documentation

## 2015-12-16 NOTE — Progress Notes (Signed)
*  PRELIMINARY RESULTS* Echocardiogram 2D Echocardiogram has been performed.  Brandon Maddox 12/16/2015, 11:13 AM

## 2015-12-17 ENCOUNTER — Telehealth: Payer: Self-pay

## 2015-12-17 NOTE — Telephone Encounter (Signed)
-----   Message from Satira Sark, MD sent at 12/17/2015  8:54 AM EDT ----- Results reviewed. LVEF normal at 55-60%, no major valvular abnormalities. We will discuss further at follow-up and see how he is doing symptomatically with atrial fibrillation. A copy of this test should be forwarded to [facility], MD.

## 2015-12-17 NOTE — Telephone Encounter (Signed)
Called pt, no answer- left message for pt to return call.  

## 2016-03-10 NOTE — Progress Notes (Deleted)
Cardiology Office Note  Date: 03/10/2016   ID: Brandon Maddox, DOB 1950-09-27, MRN [id number]  PCP: [name], MD  Primary Cardiologist: Rozann Lesches, MD   No chief complaint on file.   History of Present Illness: Brandon Maddox is a 65 y.o. male last seen in May.  Past Medical History:  Diagnosis Date  . Closed right scapular fracture   . Depression   . GERD (gastroesophageal reflux disease)   . Gout   . History of alcohol abuse   . Low testosterone   . Ribs, multiple fractures     Past Surgical History:  Procedure Laterality Date  . HIP SURGERY  2010   Dr. Mayer Camel  . LACERATION REPAIR  05/03/2012   Procedure: REPAIR MULTIPLE LACERATIONS;  Surgeon: Jerrell Belfast, MD;  Location: [facility];  Service: ENT;  Laterality: N/A;    Current Outpatient Prescriptions  Medication Sig Dispense Refill  . allopurinol (ZYLOPRIM) 300 MG tablet     . aspirin EC 325 MG tablet Take 1 tablet (325 mg total) by mouth daily. 30 tablet 0  . buPROPion (WELLBUTRIN XL) 150 MG 24 hr tablet 450 mg.   4  . metoprolol succinate (TOPROL XL) 25 MG 24 hr tablet Take 1 tablet (25 mg total) by mouth daily. 90 tablet 3  . Multiple Vitamin (MULTIVITAMIN WITH MINERALS) TABS tablet Take 1 tablet by mouth daily. 30 tablet 0  . sertraline (ZOLOFT) 50 MG tablet Take 50 mg by mouth daily.    . Tamsulosin HCl (FLOMAX) 0.4 MG CAPS Take 0.4 mg by mouth daily after supper.      No current facility-administered medications for this visit.    Allergies:  Penicillins   Social History: The patient  reports that he quit smoking about 12 years ago. His smoking use included Cigarettes. He has never used smokeless tobacco. He reports that he drinks alcohol.   Family History: The patient's family history includes Diabetes in his father and mother.   ROS:  Please see the history of present illness. Otherwise, complete review of systems is positive for {NONE DEFAULTED:18576::"none"}.  All other systems are reviewed and  negative.   Physical Exam: VS:  There were no vitals taken for this visit., BMI There is no height or weight on file to calculate BMI.  Wt Readings from Last 3 Encounters:  08/27/15 213 lb (96.6 kg)  04/04/13 217 lb 9.5 oz (98.7 kg)  05/03/12 220 lb (99.8 kg)    General: Overweight male, appears comfortable at rest. HEENT: Conjunctiva and lids normal, oropharynx clear. Neck: Supple, no elevated JVP or carotid bruits, no thyromegaly. Lungs: Clear to auscultation, nonlabored breathing at rest. Cardiac: Irregularly irregular, no S3 or significant systolic murmur, no pericardial rub. Abdomen: Soft, nontender, bowel sounds present, no guarding or rebound. Extremities: No pitting edema, distal pulses 2+. Skin: Warm and dry. Musculoskeletal: No kyphosis. Neuropsychiatric: Alert and oriented x3, affect grossly appropriate.  ECG: I personally reviewed the tracing from 08/06/2015 which showed atrial fibrillation with poor R-wave progression, rule out old anteroseptal infarct pattern.  Recent Labwork:  April 2017: Uric acid 4.9, testosterone 814  Other Studies Reviewed Today:  Echocardiogram 12/16/2015: Study Conclusions  - Left ventricle: The cavity size was normal. Wall thickness was   increased in a pattern of mild LVH. Systolic function was normal.   The estimated ejection fraction was in the range of 55% to 60%.   Wall motion was normal; there were no regional wall motion   abnormalities.  The study is not technically sufficient to allow   evaluation of LV diastolic function.  Assessment and Plan:   Current medicines were reviewed with the patient today.  No orders of the defined types were placed in this encounter.   Disposition:  Signed, Satira Sark, MD, [facility] 03/10/2016 11:38 AM    [place] at [place]. [address] [place], [place] [address] Phone: [phone]; Fax: [phone]

## 2016-03-15 ENCOUNTER — Ambulatory Visit: Payer: BLUE CROSS/BLUE SHIELD | Admitting: Cardiology

## 2016-03-23 ENCOUNTER — Ambulatory Visit: Payer: BLUE CROSS/BLUE SHIELD | Admitting: Cardiology

## 2016-03-23 ENCOUNTER — Encounter: Payer: Self-pay | Admitting: Cardiology

## 2016-03-31 ENCOUNTER — Ambulatory Visit (INDEPENDENT_AMBULATORY_CARE_PROVIDER_SITE_OTHER): Payer: BLUE CROSS/BLUE SHIELD | Admitting: Cardiology

## 2016-03-31 ENCOUNTER — Encounter: Payer: Self-pay | Admitting: Cardiology

## 2016-03-31 VITALS — BP 124/74 | HR 93 | Ht 71.0 in | Wt 224.0 lb

## 2016-03-31 DIAGNOSIS — I4819 Other persistent atrial fibrillation: Secondary | ICD-10-CM

## 2016-03-31 DIAGNOSIS — I481 Persistent atrial fibrillation: Secondary | ICD-10-CM | POA: Diagnosis not present

## 2016-03-31 NOTE — Patient Instructions (Signed)
Medication Instructions:  Your physician recommends that you continue on your current medications as directed. Please refer to the Current Medication list given to you today.   Labwork: none  Testing/Procedures: non3  Follow-Up: Your physician wants you to follow-up in: 1 year.  You will receive a reminder letter in the mail two months in advance. If you don't receive a letter, please call our office to schedule the follow-up appointment.   Any Other Special Instructions Will Be Listed Below (If Applicable).     If you need a refill on your cardiac medications before your next appointment, please call your pharmacy.

## 2016-03-31 NOTE — Progress Notes (Signed)
Cardiology Office Note  Date: 03/31/2016   ID: BAILEY KOLBE, DOB [birth year], MRN [id number]  PCP: [name], MD  Primary Cardiologist: Rozann Lesches, MD   Chief Complaint  Patient presents with  . Persistent atrial fibrillation    History of Present Illness: Brandon Maddox is a 65 y.o. male that I met back in May. He presents for a follow-up visit. Overall continues to do well, does not report any prolonged palpitations and has had no chest discomfort. 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  CHADSVASC score is 0. He has been on aspirin and Toprol-XL with long-standing history of atrial fibrillation that has been persistent most recently. Heart rate looks to be adequately controlled.  Past Medical History:  Diagnosis Date  . Closed right scapular fracture   . Depression   . GERD (gastroesophageal reflux disease)   . Gout   . History of alcohol abuse   . Low testosterone   . Ribs, multiple fractures     Current Outpatient Prescriptions  Medication Sig Dispense Refill  . allopurinol (ZYLOPRIM) 300 MG tablet     . aspirin EC 325 MG tablet Take 1 tablet (325 mg total) by mouth daily. 30 tablet 0  . buPROPion (WELLBUTRIN XL) 150 MG 24 hr tablet 450 mg.   4  . metoprolol succinate (TOPROL XL) 25 MG 24 hr tablet Take 1 tablet (25 mg total) by mouth daily. 90 tablet 3  . Multiple Vitamin (MULTIVITAMIN WITH MINERALS) TABS tablet Take 1 tablet by mouth daily. 30 tablet 0  . sertraline (ZOLOFT) 50 MG tablet Take 50 mg by mouth daily.    . Tamsulosin HCl (FLOMAX) 0.4 MG CAPS Take 0.4 mg by mouth daily after supper.      No current facility-administered medications for this visit.    Allergies:  Penicillins   Social History: The patient  reports that he quit smoking about 12 years ago. His smoking use included Cigarettes. He has never used smokeless tobacco. He reports that he drinks alcohol.   ROS:  Please see the  history of present illness. Otherwise, complete review of systems is positive for fatigue.  All other systems are reviewed and negative.   Physical Exam: VS:  BP 124/74   Pulse 93   Ht 5' 11"  (1.803 m)   Wt 224 lb (101.6 kg)   SpO2 98%   BMI 31.24 kg/m , BMI Body mass index is 31.24 kg/m.  Wt Readings from Last 3 Encounters:  03/31/16 224 lb (101.6 kg)  08/27/15 213 lb (96.6 kg)  04/04/13 217 lb 9.5 oz (98.7 kg)    General: Overweight male, appears comfortable at rest. HEENT: Conjunctiva and lids normal, oropharynx clear. Neck: Supple, no elevated JVP or carotid bruits, no thyromegaly. Lungs: Clear to auscultation, nonlabored breathing at rest. Cardiac: Irregularly irregular, no S3 or significant systolic murmur, no pericardial rub. Abdomen: Soft, nontender, bowel sounds present, no guarding or rebound. Extremities: No pitting edema, distal pulses 2+.  ECG: I personally reviewed the tracing from 08/06/2015 which showed atrial fibrillation.  Recent Labwork:  April 2017: Uric acid 4.9, testosterone 814  Other Studies Reviewed Today:  Echocardiogram 12/16/2015: Study Conclusions  - Left ventricle: The cavity size was normal. Wall thickness was   increased in a pattern of mild LVH. Systolic function was normal.   The estimated ejection fraction was in the range of 55% to 60%.   Wall motion was normal;  there were no regional wall motion   abnormalities. The study is not technically sufficient to allow   evaluation of LV diastolic function.  Assessment and Plan:  Persistent atrial fibrillation. Plan is to continue strategy of heart rate control on Toprol-XL at this point, he is not overly symptomatic to suggest aggressive measures a trying to restore sinus rhythm. He has a CHADSVASC score of 0. Echocardiogram done within the last 6 months shows normal LVEF and no major valvular abnormalities.  Current medicines were reviewed with the patient today.  Disposition: Follow-up in  one year.  Signed, Satira Sark, MD, [facility] 03/31/2016 9:37 AM    [facility] at [place]. [address] [place], [place] [address] Phone: [phone]; Fax: [phone]

## 2016-04-06 ENCOUNTER — Ambulatory Visit: Payer: BLUE CROSS/BLUE SHIELD | Admitting: Cardiology

## 2016-11-09 DIAGNOSIS — D751 Secondary polycythemia: Secondary | ICD-10-CM | POA: Diagnosis not present

## 2016-11-09 DIAGNOSIS — E119 Type 2 diabetes mellitus without complications: Secondary | ICD-10-CM | POA: Diagnosis not present

## 2016-11-09 DIAGNOSIS — E291 Testicular hypofunction: Secondary | ICD-10-CM | POA: Diagnosis not present

## 2016-11-09 DIAGNOSIS — M1000 Idiopathic gout, unspecified site: Secondary | ICD-10-CM | POA: Diagnosis not present

## 2016-12-01 ENCOUNTER — Ambulatory Visit (HOSPITAL_COMMUNITY): Payer: BLUE CROSS/BLUE SHIELD

## 2016-12-28 ENCOUNTER — Ambulatory Visit (HOSPITAL_COMMUNITY): Payer: BLUE CROSS/BLUE SHIELD

## 2017-01-17 ENCOUNTER — Encounter (HOSPITAL_COMMUNITY): Payer: Self-pay | Admitting: Oncology

## 2017-01-17 ENCOUNTER — Encounter (HOSPITAL_COMMUNITY): Payer: PPO | Attending: Oncology | Admitting: Oncology

## 2017-01-17 ENCOUNTER — Encounter (HOSPITAL_COMMUNITY): Payer: PPO

## 2017-01-17 VITALS — BP 133/99 | HR 64 | Resp 18 | Ht 70.5 in | Wt 217.0 lb

## 2017-01-17 DIAGNOSIS — F329 Major depressive disorder, single episode, unspecified: Secondary | ICD-10-CM | POA: Diagnosis not present

## 2017-01-17 DIAGNOSIS — D751 Secondary polycythemia: Secondary | ICD-10-CM | POA: Diagnosis not present

## 2017-01-17 DIAGNOSIS — K219 Gastro-esophageal reflux disease without esophagitis: Secondary | ICD-10-CM | POA: Insufficient documentation

## 2017-01-17 DIAGNOSIS — Z87891 Personal history of nicotine dependence: Secondary | ICD-10-CM | POA: Diagnosis not present

## 2017-01-17 DIAGNOSIS — Z7982 Long term (current) use of aspirin: Secondary | ICD-10-CM | POA: Insufficient documentation

## 2017-01-17 DIAGNOSIS — D45 Polycythemia vera: Secondary | ICD-10-CM

## 2017-01-17 DIAGNOSIS — Z79899 Other long term (current) drug therapy: Secondary | ICD-10-CM | POA: Diagnosis not present

## 2017-01-17 DIAGNOSIS — M109 Gout, unspecified: Secondary | ICD-10-CM | POA: Diagnosis not present

## 2017-01-17 DIAGNOSIS — R5383 Other fatigue: Secondary | ICD-10-CM

## 2017-01-17 HISTORY — DX: Polycythemia vera: D45

## 2017-01-17 LAB — CBC WITH DIFFERENTIAL/PLATELET
BASOS ABS: 0 10*3/uL (ref 0.0–0.1)
BASOS PCT: 0 %
Eosinophils Absolute: 0.1 10*3/uL (ref 0.0–0.7)
Eosinophils Relative: 1 %
HCT: 54.3 % — ABNORMAL HIGH (ref 39.0–52.0)
HEMOGLOBIN: 19.1 g/dL — AB (ref 13.0–17.0)
LYMPHS PCT: 32 %
Lymphs Abs: 2.1 10*3/uL (ref 0.7–4.0)
MCH: 31.7 pg (ref 26.0–34.0)
MCHC: 35.2 g/dL (ref 30.0–36.0)
MCV: 90.2 fL (ref 78.0–100.0)
MONO ABS: 0.5 10*3/uL (ref 0.1–1.0)
MONOS PCT: 7 %
NEUTROS ABS: 3.9 10*3/uL (ref 1.7–7.7)
NEUTROS PCT: 60 %
Platelets: 169 10*3/uL (ref 150–400)
RBC: 6.02 MIL/uL — ABNORMAL HIGH (ref 4.22–5.81)
RDW: 14 % (ref 11.5–15.5)
WBC: 6.6 10*3/uL (ref 4.0–10.5)

## 2017-01-17 LAB — IRON AND TIBC
Iron: 153 ug/dL (ref 45–182)
SATURATION RATIOS: 47 % — AB (ref 17.9–39.5)
TIBC: 323 ug/dL (ref 250–450)
UIBC: 170 ug/dL

## 2017-01-17 LAB — FERRITIN: FERRITIN: 203 ng/mL (ref 24–336)

## 2017-01-17 NOTE — Progress Notes (Signed)
[place] @ [facility] Telephone:(336) [phone]  Fax:(336) Brandon Maddox: 22-Jul-1950  MR#: [id number]  ACZ#:[id number]  Patient Care Team: [name], MD as PCP - General (Internal Medicine) Phylliss Bob, MD (Orthopedic Surgery)  CHIEF COMPLAINT:  Chief Complaint  Patient presents with  . New Patient (Initial Visit)    VISIT DIAGNOSIS:     ICD-10-CM   1. 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    Oncology Flowsheet 05/04/2012 04/02/2013 04/03/2013 04/03/2013 04/04/2013  dexamethasone (DECADRON) IJ 4 mg - - - -  enoxaparin (LOVENOX) [place] - - 40 mg   40 mg  LORazepam (ATIVAN) PO - 1 mg 1 mg 1 mg 1 mg  ondansetron (ZOFRAN) IJ 4 mg - - - -    INTERVAL HISTORY: 66 year old gentleman was referred to me for further evaluation regarding polycythemia.  In July of 2018 during regular appointment patient had been found to have hemoglobin of 21.5 and hematocrit of 62.3.  Patient had been on testosterone replacement therapy and was advised to discontinue that.  Main complaint is fatigue mainly after testosterone replacement therapy has been discontinued. Patient is here for further evaluation and treatment consideration for his polycythemia cause not known  REVIEW OF SYSTEMS:    GENERAL:  Feels good.  Active.  No fevers, sweats or weight loss.  He still complains of fatigue.  PERFORMANCE STATUS (ECOG):  1 HEENT:  No visual changes, runny nose, sore throat, mouth sores or tenderness. Lungs: No shortness of breath or cough.  No hemoptysis. Cardiac:  No chest pain, palpitations, orthopnea, or PND.  Patient has atrial fibrillation GI:  No nausea, vomiting, diarrhea, constipation, melena or hematochezia. GU:  No urgency, frequency,  dysuria, or hematuria.  Patient had a penile implants because of previous injury Was on testosterone replacement therapy which has been discontinued in July 2018 Musculoskeletal:  No back pain.  No joint pain.  No muscle tenderness. Extremities:  No pain or swelling. Skin:  No rashes or skin changes. Neuro:  No headache, numbness or weakness, balance or coordination issues. Endocrine:  No diabetes, thyroid issues, hot flashes or night sweats. Psych:  No mood changes, depression or anxiety. Pain:  No focal pain. Review of systems:  All other systems reviewed and found to be negative. As per HPI. Otherwise, a complete review of systems is negatve.  PAST MEDICAL HISTORY: Past Medical History:  Diagnosis Date  . Closed right scapular fracture   . Depression   . GERD (gastroesophageal reflux disease)   . Gout   . History of alcohol abuse   . Low testosterone   . Polycythemia vera ([place]) 01/17/2017  . Ribs, multiple fractures     PAST SURGICAL HISTORY: Past Surgical History:  Procedure Laterality Date  . HIP SURGERY  2010   Dr. Mayer Camel  . LACERATION REPAIR  05/03/2012   Procedure: REPAIR MULTIPLE LACERATIONS;  Surgeon: Jerrell Belfast, MD;  Location: [facility] OR;  Service: ENT;  Laterality: N/A;    FAMILY HISTORY Family History  Problem Relation Age of Onset  . Diabetes Mother   . Diabetes Father        ADVANCED DIRECTIVES:  Patient does NOT  have advance healthcare directive, Patient   does not desire to make any changes HEALTH MAINTENANCE: Social History  Substance Use Topics  . Smoking status: Former Smoker    Types: Cigarettes    Quit date: 04/20/2003  . Smokeless tobacco: Never Used  . Alcohol use 0.0 oz/week     Comment: History of alcohol abuse in the past     Colonoscopy:  PAP:  Bone density:  Lipid panel:  Allergies  Allergen Reactions  . Penicillins     Mother told him throat swelled up when he took at a young age     Current Outpatient Prescriptions    Medication Sig Dispense Refill  . allopurinol (ZYLOPRIM) 300 MG tablet     . aspirin EC 325 MG tablet Take 1 tablet (325 mg total) by mouth daily. 30 tablet 0  . buPROPion (WELLBUTRIN XL) 150 MG 24 hr tablet 450 mg.   4  . metoprolol succinate (TOPROL XL) 25 MG 24 hr tablet Take 1 tablet (25 mg total) by mouth daily. 90 tablet 3  . Multiple Vitamin (MULTIVITAMIN WITH MINERALS) TABS tablet Take 1 tablet by mouth daily. 30 tablet 0  . omeprazole (PRILOSEC OTC) 20 MG tablet 20 mg delayed release tablet; oral every day; Dispense: 30    . sertraline (ZOLOFT) 50 MG tablet Take 50 mg by mouth daily.    . Tamsulosin HCl (FLOMAX) 0.4 MG CAPS Take 0.4 mg by mouth daily after supper.      No current facility-administered medications for this visit.     OBJECTIVE: PHYSICAL EXAM: GENERAL:  Well developed, well nourished, sitting comfortably in the exam room in no acute distress. MENTAL STATUS:  Alert and oriented to person, place and time. HEAD:  Face is somewhat flushed Normocephalic, atraumatic, face symmetric, no Cushingoid features. EYES:   Pupils equal round and reactive to light and accomodation.  No conjunctivitis or scleral icterus. ENT:  Oropharynx clear without lesion.  Tongue normal. Mucous membranes moist.  RESPIRATORY:  Clear to auscultation without rales, wheezes or rhonchi. CARDIOVASCULAR:  Regular rate and rhythm without murmur, rub or gallop. BREAST:  Right breast without masses, skin changes or nipple discharge.  Left breast without masses, skin changes or nipple discharge. ABDOMEN:  Soft, non-tender, with active bowel sounds, and no hepatosplenomegaly.  No masses. BACK:  No CVA tenderness.  No tenderness on percussion of the back or rib cage. SKIN:  No rashes, ulcers or lesions. EXTREMITIES: No edema, no skin discoloration or tenderness.  No palpable cords. LYMPH NODES: No palpable cervical, supraclavicular, axillary or inguinal adenopathy  NEUROLOGICAL: Unremarkable. PSYCH:   Appropriate.  Vitals:   01/17/17 0828  BP: (!) 133/99  Pulse: 64  Resp: 18  SpO2: 98%     Body mass index is 30.7 kg/m.    ECOG FS:0 - Asymptomatic  LAB RESULTS:  Appointment on 01/17/2017  Component Date Value Ref Range Status  . WBC 01/17/2017 6.6  4.0 - 10.5 K/uL Final  . RBC 01/17/2017 6.02* 4.22 - 5.81 MIL/uL Final  . Hemoglobin 01/17/2017 19.1* 13.0 - 17.0 g/dL Final  . HCT 01/17/2017 54.3* 39.0 - 52.0 % Final  . MCV 01/17/2017 90.2  78.0 - 100.0 fL Final  . MCH 01/17/2017 31.7  26.0 - 34.0 pg Final  . MCHC 01/17/2017 35.2  30.0 - 36.0 g/dL Final  . RDW 01/17/2017 14.0  11.5 - 15.5 % Final  . Platelets 01/17/2017 169  150 - 400 K/uL Final  .  Neutrophils Relative % 01/17/2017 60  % Final  . Neutro Abs 01/17/2017 3.9  1.7 - 7.7 K/uL Final  . Lymphocytes Relative 01/17/2017 32  % Final  . Lymphs Abs 01/17/2017 2.1  0.7 - 4.0 K/uL Final  . Monocytes Relative 01/17/2017 7  % Final  . Monocytes Absolute 01/17/2017 0.5  0.1 - 1.0 K/uL Final  . Eosinophils Relative 01/17/2017 1  % Final  . Eosinophils Absolute 01/17/2017 0.1  0.0 - 0.7 K/uL Final  . Basophils Relative 01/17/2017 0  % Final  . Basophils Absolute 01/17/2017 0.0  0.0 - 0.1 K/uL Final     STUDIES: CBC done today revealed hemoglobin has dropped from 19.2 with hematocrit 54.3  All of the lab data from the outside facility has been reviewed.  On November 03, 2016 his hemoglobin was 21.5 with hematocrit of 62.3.   ASSESSMENT:  Polycythemia  most likely secondary to testosterone replacement therapy  After stopping the testosterone hematocrit has decreased   We will also evaluate him for primary polycythemia with Barnabas Lister mutation.  Rule out any myeloproliferative disease with BCR ABL evaluation as well as with ultrasound of of abdomen and pelvis   Recheck hemoglobin in 3 weeks.  If hemoglobin continues to decline then patient will not need any phlebotomy.  Depending on the Barnabas Lister mutation result further decision can be  taken.   Discussed With the patient in detail and he is in agreement with that.   PLAN:  No problem-specific Assessment & Plan notes found for this encounter.   Patient expressed understanding and was in agreement with this plan. He also understands that He can call clinic at any time with any questions, concerns, or complaints.    Cancer Staging No matching staging information was found for the patient.  Forest Gleason, MD   01/17/2017 11:49 AM

## 2017-01-17 NOTE — Patient Instructions (Signed)
[facility] Discharge Instructions  RECOMMENDATIONS MADE BY THE CONSULTANT AND ANY TEST RESULTS WILL BE SENT TO YOUR REFERRING PHYSICIAN.  You were seen today by Dr. Choksi.   Thank you for choosing [facility] at [facility] to provide your oncology and hematology care.  To afford each patient quality time with our provider, please arrive at least 15 minutes before your scheduled appointment time.    If you have a lab appointment with the Cancer Center please come in thru the  Main Entrance and check in at the main information desk  You need to re-schedule your appointment should you arrive 10 or more minutes late.  We strive to give you quality time with our providers, and arriving late affects you and other patients whose appointments are after yours.  Also, if you no show three or more times for appointments you may be dismissed from the clinic at the providers discretion.     Again, thank you for choosing [facility].  Our hope is that these requests will decrease the amount of time that you wait before being seen by our physicians.       _____________________________________________________________  Should you have questions after your visit to [facility], please contact our office at [phone] between the hours of 8:30 a.m. and 4:30 p.m.  Voicemails left after 4:30 p.m. will not be returned until the following business day.  For prescription refill requests, have your pharmacy contact our office.       Resources For Cancer Patients and their Caregivers ? American Cancer Society: Can assist with transportation, wigs, general needs, runs Look Good Feel Better.        1-888-227-6333 ? Cancer Care: Provides financial assistance, online support groups, medication/co-pay assistance.  1-800-813-HOPE (4673) ? [facility] Assists [place] cancer patients and their  families through emotional , educational and financial support.  336-427-4357 ? [place] DSS Where to apply for food stamps, Medicaid and utility assistance. 336-342-1394 ? [facility]: Transportation to medical appointments. 336-347-2287 ? Social Security Administration: May apply for disability if have a Stage IV cancer. 336-342-7796 1-800-772-1213 ? [place] Aging, Disability and Transit Services: Assists with nutrition, care and transit needs. 336-349-2343  Cancer Center Support Programs: @10RELATIVEDAYS@ > Cancer Support Group  2nd Tuesday of the month 1pm-2pm, Journey Room  > Creative Journey  3rd Tuesday of the month 1130am-1pm, Journey Room  > Look Good Feel Better  1st Wednesday of the month 10am-12 noon, Journey Room (Call American Cancer Society to register 1-800-395-5775)    

## 2017-01-20 ENCOUNTER — Ambulatory Visit (HOSPITAL_COMMUNITY)
Admission: RE | Admit: 2017-01-20 | Discharge: 2017-01-20 | Disposition: A | Discharge status: Home / Self Care | Payer: PPO | Source: Ambulatory Visit | Attending: Oncology | Admitting: Oncology

## 2017-01-20 DIAGNOSIS — K769 Liver disease, unspecified: Secondary | ICD-10-CM | POA: Diagnosis not present

## 2017-01-20 DIAGNOSIS — D45 Polycythemia vera: Secondary | ICD-10-CM | POA: Insufficient documentation

## 2017-01-20 DIAGNOSIS — R161 Splenomegaly, not elsewhere classified: Secondary | ICD-10-CM | POA: Insufficient documentation

## 2017-01-24 LAB — BCR-ABL1, CML/ALL, PCR, QUANT: b2a2 transcript: 0.0624 %

## 2017-01-27 LAB — CALR + JAK2 E12-15 + MPL (REFLEXED)

## 2017-01-27 LAB — JAK2 V617F, W REFLEX TO CALR/E12/MPL

## 2017-02-08 ENCOUNTER — Encounter (HOSPITAL_BASED_OUTPATIENT_CLINIC_OR_DEPARTMENT_OTHER): Payer: PPO | Admitting: Oncology

## 2017-02-08 ENCOUNTER — Encounter (HOSPITAL_COMMUNITY): Payer: Self-pay | Admitting: Oncology

## 2017-02-08 VITALS — BP 120/79 | HR 81 | Resp 16 | Ht 70.5 in | Wt 219.0 lb

## 2017-02-08 DIAGNOSIS — D751 Secondary polycythemia: Secondary | ICD-10-CM

## 2017-02-08 DIAGNOSIS — D45 Polycythemia vera: Secondary | ICD-10-CM

## 2017-02-08 DIAGNOSIS — C9210 Chronic myeloid leukemia, BCR/ABL-positive, not having achieved remission: Secondary | ICD-10-CM | POA: Diagnosis not present

## 2017-02-08 DIAGNOSIS — R5383 Other fatigue: Secondary | ICD-10-CM

## 2017-02-08 NOTE — Progress Notes (Signed)
[place] @ [facility] Telephone:(336) [phone]  Fax:(336) [place]: 02-06-1951  MR#: [id number]  KVQ#:[id number]  Patient Care Team: [name], MD as PCP - General (Internal Medicine) Phylliss Bob, MD (Orthopedic Surgery)  CHIEF COMPLAINT:  Chief Complaint  Patient presents with  . Follow-up    VISIT DIAGNOSIS:     ICD-10-CM   1. 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    Oncology Flowsheet 05/04/2012 04/02/2013 04/03/2013 04/03/2013 04/04/2013  dexamethasone (DECADRON) IJ 4 mg - - - -  enoxaparin (LOVENOX) [place] - - 40 mg   40 mg  LORazepam (ATIVAN) PO - 1 mg 1 mg 1 mg 1 mg  ondansetron (ZOFRAN) IJ 4 mg - - - -    INTERVAL HISTORY: Patient presents today for continued evaluation of his elevated hemoglobin/hematocrit. He has been off of testosterone replacement for 2 months now. His main complaint is fatigue. He denies any weight loss, night sweats, fevers/chills, recent infections, chest pain, shortness of breath.  REVIEW OF SYSTEMS:    GENERAL:  Feels good.  Active.  No fevers, sweats or weight loss.  He still complains of fatigue. HEENT:  No visual changes, runny nose, sore throat, mouth sores or tenderness. Lungs: No shortness of breath or cough.  No hemoptysis. Cardiac:  No chest pain, palpitations, orthopnea, or PND.  Patient has atrial fibrillation GI:  No nausea, vomiting, diarrhea, constipation, melena or hematochezia. GU:  No urgency, frequency, dysuria, or hematuria.  Patient had a penile implants because of previous injury Was on testosterone replacement therapy which has been discontinued in July 2018 Musculoskeletal:  No back pain.  No joint pain.  No muscle tenderness. Extremities:  No pain or swelling. Skin:  No rashes or skin changes. Neuro:  No headache, numbness or weakness, balance or coordination  issues. Endocrine:  No diabetes, thyroid issues, hot flashes or night sweats. Psych:  No mood changes, depression or anxiety. Pain:  No focal pain. Review of systems:  All other systems reviewed and found to be negative. As per HPI. Otherwise, a complete review of systems is negatve.  PAST MEDICAL HISTORY: Past Medical History:  Diagnosis Date  . Closed right scapular fracture   . Depression   . GERD (gastroesophageal reflux disease)   . Gout   . History of alcohol abuse   . Low testosterone   . Polycythemia vera ([place]) 01/17/2017  . Ribs, multiple fractures     PAST SURGICAL HISTORY: Past Surgical History:  Procedure Laterality Date  . HIP SURGERY  2010   Dr. Mayer Camel  . LACERATION REPAIR  05/03/2012   Procedure: REPAIR MULTIPLE LACERATIONS;  Surgeon: Jerrell Belfast, MD;  Location: [facility] OR;  Service: ENT;  Laterality: N/A;    FAMILY HISTORY Family History  Problem Relation Age of Onset  . Diabetes Mother   . Diabetes Father        ADVANCED DIRECTIVES:   Patient does NOT  have advance healthcare directive, Patient   does not desire to make any changes HEALTH MAINTENANCE: Social History  Substance Use Topics  . Smoking status: Former Smoker    Types: Cigarettes    Quit date: 04/20/2003  . Smokeless tobacco: Never Used  . Alcohol use 0.0 oz/week     Comment: History of alcohol abuse in the past  Colonoscopy:  PAP:  Bone density:  Lipid panel:  Allergies  Allergen Reactions  . Penicillins     Mother told him throat swelled up when he took at a young age     Current Outpatient Prescriptions  Medication Sig Dispense Refill  . allopurinol (ZYLOPRIM) 300 MG tablet     . aspirin EC 325 MG tablet Take 1 tablet (325 mg total) by mouth daily. 30 tablet 0  . buPROPion (WELLBUTRIN XL) 150 MG 24 hr tablet 450 mg.   4  . metoprolol succinate (TOPROL XL) 25 MG 24 hr tablet Take 1 tablet (25 mg total) by mouth daily. 90 tablet 3  . Multiple Vitamin (MULTIVITAMIN WITH  MINERALS) TABS tablet Take 1 tablet by mouth daily. 30 tablet 0  . omeprazole (PRILOSEC OTC) 20 MG tablet 20 mg delayed release tablet; oral every day; Dispense: 30    . sertraline (ZOLOFT) 50 MG tablet Take 50 mg by mouth daily.    . Tamsulosin HCl (FLOMAX) 0.4 MG CAPS Take 0.4 mg by mouth daily after supper.      No current facility-administered medications for this visit.     OBJECTIVE: Physical Exam  Vitals:   02/08/17 1026  BP: 120/79  Pulse: 81  Resp: 16  SpO2: 99%     Body mass index is 30.98 kg/m.    ECOG FS:0 - Asymptomatic  Constitutional: Well-developed, well-nourished, and in no distress.   HENT:  Head: Normocephalic and atraumatic.  Mouth/Throat: No oropharyngeal exudate. Mucosa moist. Eyes: Pupils are equal, round, and reactive to light. Conjunctivae are normal. No scleral icterus.  Neck: Normal range of motion. Neck supple. No JVD present.  Cardiovascular: Normal rate, regular rhythm and normal heart sounds.  Exam reveals no gallop and no friction rub.   No murmur heard. Pulmonary/Chest: Effort normal and breath sounds normal. No respiratory distress. No wheezes.No rales.  Abdominal: Soft. Bowel sounds are normal. No distension. There is no tenderness. There is no guarding.  Musculoskeletal: No edema or tenderness.  Lymphadenopathy:    No cervical or supraclavicular adenopathy.  Neurological: Alert and oriented to person, place, and time. No cranial nerve deficit.  Skin: Skin is warm and dry. No rash noted. No erythema. No pallor.  Psychiatric: Affect and judgment normal.   LAB RESULTS:  No visits with results within 5 Day(s) from this visit.  Latest known visit with results is:  Appointment on 01/17/2017  Component Date Value Ref Range Status  . WBC 01/17/2017 6.6  4.0 - 10.5 K/uL Final  . RBC 01/17/2017 6.02* 4.22 - 5.81 MIL/uL Final  . Hemoglobin 01/17/2017 19.1* 13.0 - 17.0 g/dL Final  . HCT 01/17/2017 54.3* 39.0 - 52.0 % Final  . MCV 01/17/2017 90.2   78.0 - 100.0 fL Final  . MCH 01/17/2017 31.7  26.0 - 34.0 pg Final  . MCHC 01/17/2017 35.2  30.0 - 36.0 g/dL Final  . RDW 01/17/2017 14.0  11.5 - 15.5 % Final  . Platelets 01/17/2017 169  150 - 400 K/uL Final  . Neutrophils Relative % 01/17/2017 60  % Final  . Neutro Abs 01/17/2017 3.9  1.7 - 7.7 K/uL Final  . Lymphocytes Relative 01/17/2017 32  % Final  . Lymphs Abs 01/17/2017 2.1  0.7 - 4.0 K/uL Final  . Monocytes Relative 01/17/2017 7  % Final  . Monocytes Absolute 01/17/2017 0.5  0.1 - 1.0 K/uL Final  . Eosinophils Relative 01/17/2017 1  % Final  . Eosinophils Absolute 01/17/2017 0.1  0.0 - 0.7 K/uL Final  . Basophils Relative 01/17/2017 0  % Final  . Basophils Absolute 01/17/2017 0.0  0.0 - 0.1 K/uL Final  . Iron 01/17/2017 153  45 - 182 ug/dL Final  . TIBC 01/17/2017 323  250 - 450 ug/dL Final  . Saturation Ratios 01/17/2017 47* 17.9 - 39.5 % Final  . UIBC 01/17/2017 170  ug/dL Final   Performed at [facility], [place] [address] [place], [place] [address]   BCR-ABL1, CML/ALL, PCR, QUANT  Order: [id number]  Status:  Edited Result - FINAL Visible to patient:  No (Not Released) Next appt:  02/17/2017 at 07:00 AM in No Specialty [facility] Room) Dx:  Polycythemia vera (HCC)   Ref Range & Units 3wk ago  b2a2 transcript % 0.0624   b3a2 transcript % Comment   Comment: (NOTE)       <0.0032 %  (sensitivity limit of assay)   E1A2 Transcript % Comment   Comment: (NOTE)       <0.0032 %  (sensitivity limit of assay)   Interpretation (BCRAL):  ABL1 e13a2 (b2a2, p210) fusion transcript.   Comment: Comment  POSITIVE for the [place]   Director Review [facility]):  Comment   Comment: (NOTE)  Constance Goltz, PhD, [facility]         Director, [place] for [place] and [place], [place]         [phone]   Background:  [place]: (NOTE)  This assay can detect three  different types of BCR-ABL1 fusion  transcripts associated with CML, ALL, and AML: e13a2 (previously  b2a2) and e14a2 (previously b3a2) (major breakpoint, p210), as  well as e1a2 (minor breakpoint, p190). The e13a2 and e14a2  transcript values are titrated to the current International Scale  (IS). The standardized baseline is 100% BCR-ABL1 (IS) and major  molecular response (MMR) is equivalent to 0.1% BCR-ABL1 (IS)  corresponding to a 3-log reduction. Results should be correlated with  appropriate clinical and laboratory information as indicated.   Methodology  CommentVC   Comment: (NOTE)  Total RNA is isolated from the sample and subject to a real-time,  reverse transcriptase polymerase chain reaction (RT-PCR). The PCR  primers and probes are specific for BCR-ABL1 e13a2, e14a2 and e1a2  fusion transcripts. The ABL1 transcript is amplified as the control  for cDNA quantity and quality. Serial dilutions of a validated  positive control RNA with known t(9;22) BCR-ABL1 are used as  reference for quantification of BCR-ABL1 relative to ABL1. The  numeric BCR-ABL1 level is reportd as % BCR-ABL1/ABL1 and the  detection sensitivity is 4.5 log below the standard baseline.  This test was developed and its performance characteristics  determined by [facility]. It has not been cleared or approved by the  Food and Drug Administration.  References:   1. Anastasia Fiedler and Branford S: Seminars in Hematology 2003;     40 (suppl2):62-68.   2. White HE, et al. Blood 2010; 116: e111-117.   3. NCCN Clinical Practice Guidelines in Oncology, Chronic     Myeloid Leukemia. V2. 2017.  Performed At: [facility]  [address] [place], [place] [id number]  Nechama Guard MD YC:[id number]  Performed At: [facility]  [place], [place] [id number]  Nechama Guard MD [id number]   Resulting  Agency  [facility]    Specimen Collected: 01/17/17 09:38 Last Resulted: 01/24/17  16:33            VC=Value has a corrected status         Other Results from 01/17/2017   JAK2 V617F, w Reflex to CALR/E12/MPL  Order: [id number]   Status:  Edited Result - FINAL Visible to patient:  No (Not Released) Next appt:  02/17/2017 at 07:00 AM in No Specialty [facility] Room) Dx:  Polycythemia vera ([place])  Newer results are available. Click to view them now.  Component 3wk ago  JAK2 GenotypR Comment   Comment: (NOTE)  Result: NEGATIVE for the JAK2 V617F mutation.  Interpretation: The G to T nucleotide change encoding the V617F  mutation was not detected. This result does not rule out the  presence of the JAK2 mutation at a level below the sensitivity of  detection of this assay, or the presence of other mutations within  JAK2 not detected by this assay. This result does not rule out a  diagnosis of polycythemia vera, essential thrombocythemia or  idiopathic myelofibrosis as the V617F mutation is not detected in all  patients with these disorders.   BACKGROUND: Comment   Comment: (NOTE)  JAK2 is a cytoplasmic tyrosine kinase with a key role in signal  transduction from multiple hematopoietic growth factor receptors. A  point mutation within exon 14 of the JAK2 gene ([id number]) encoding a  valine to phenylalanine substitution at position 617 of the JAK2  protein (V617F) has been identified in most patients with  polycythemia vera, and in about half of those with either essential  thrombocythemia or idiopathic myelofibrosis. The V617F has also been  detected, although infrequently, in other myeloid disorders such as  chronic myelomonocytic leukemia and chronic neutrophilic luekemia.  V617F is an acquired mutation that alters a highly conserved valine  present in the negative regulatory JH2 domain of the JAK2 protein and  is predicted to dysregulate kinase activity.  Methodology:  Total genomic DNA was extracted and subjected to TaqMan real-time  PCR  amplification/detection. Two amplification products per sample  were monitored by real-time PCR using primers/probes specific to JAK2  wild type (WT) and JAK2 mutant V617F. The ABI7900 Absolute  Quantitation software will compare the patient specimen valuse to the  standard curves and generate percent values for wild type and mutant  type. In vitro studies have indicated that this assay has an  analytical sensitivity of 1%.  References:  Baxter EJ, Scott Phineas Real, et al. Acquired mutation of the  tyrosine kinase JAK2 in human myeloproliferative disorders. Lancet.  2005 Mar 19-25; 365(9464):1054-1061.  Alfonso Ramus Couedic JP. A unique clonal JAK2 mutation  leading to constitutive signaling causes polycythaemia vera. Nature.  2005 Apr 28; 434(7037):1144-1148.  Kralovics R, Passamonti F, Buser AS, et al. A gain-of-function  mutation of JAK2 in myeloproliferative disorders. N Engl J Med.  2005 Apr 28; 352(17):1779-1790.   Director Review, JAK2 Comment   Comment: (NOTE)  Katina Degree, MD, PhD  Director, [place] for Molecular Biology and [place], [place] [address]  [phone]  This test was developed and its performance characteristics  determined by [facility]. It has not been cleared or approved  by the Food and Drug Administration.   REFLEX: Comment   Comment: (NOTE)  Reflex to CALR Mutation Analysis, JAK2 Exon 12-15 Mutation Analysis,  and MPL Mutation Analysis is indicated.   Extraction CompletedVC  Comment: (NOTE)  Performed At: [facility]  [address] [place], [place] [id number]  Nechama Guard MD XB:[id number]  Performed At: [facility]  [place], [place] [id number]  Nechama Guard MD QI:[id number]   Resulting Agency [facility]    Specimen Collected: 01/17/17 09:38 Last Resulted: 01/27/17 17:30            VC=Value has a corrected status           Ferritin  Order:  [id number]   Status:  Final result Visible to patient:  No (Not Released) Next appt:  02/17/2017 at 07:00 AM in No Specialty [facility] Room) Dx:  Polycythemia vera ([place])   Ref Range & Units 3wk ago  Ferritin 24 - 336 ng/mL 203   Comment: Performed at [facility], [place] [address] [place], [place] [address]  Resulting Agency  [facility]    Specimen Collected: 01/17/17 09:38 Last Resulted: 01/17/17 16:25                    CALR + JAK2 E12-15 + MPL (reflexed)  Order: [id number]   Status:  Final result Visible to patient:  No (Not Released) Next appt:  02/17/2017 at 07:00 AM in No Specialty [facility] Room)  Component 3wk ago  CALR Mutation Detection Result Comment   Comment: (NOTE)  NEGATIVE  No insertions or deletions were detected within the analyzed region  of the calreticulin (CALR) gene.  A negative result does not entirely exclude the possibility of a  clonal population carrying CALR gene mutations that are not covered  by this assay. Results should be interpreted in conjunction with  clinical and laboratory findings for the most accurate  interpretation.   Background: Comment   Comment: (NOTE)  The calcium-binding endoplasmic reticulin chaperone protein,  calreticulin (CALR), is somatically mutated in approximately 70% of  patients with JAK2-negative essential thrombocythemia (ET) and 60-  88% of patients with JAK2-negative primary myelofibrosis(PMF). Only a  minority of patients (approximately 8%) with myelodysplasia have  mutations in CALR gene. CALR mutations are rarely detected in  patients with de novo acute myeloid leukemia, chronic myelogenous  leukemia, lymphoid leukemia, or solid tumors. CALR mutations are  not detected in polycythemia and generally appear to be mutually  exclusive with JAK2 mutations and MPL mutations.  The majority of mutational changes involve a variety of insertion or  deletion mutations in exon 9 of the calreticulin gene:  approximately  53% of all CALR mutations are a 52 bp deletion (type-1)  while the second most prevalent mutation (approximately 32%) contains  a 5 bp insertion (type-2). Other mutations (non-type 1 or type 2) are  seenin a small minority of cases. CALR mutations in PMF tend to be  associated with a favorable prognosis compared to JAK2 V617F  mutations, whereas primary myelofibrosis negative for CALR, JAK2  V617F and MPL mutations (so-called triple negative) is associated  with a poor prognosis and shorter survival.  The detection of a CALR gene mutation aids in the specific diagnosis  of a myeloproliferative neoplasm, and help distinguish this clonal  disease from a benign reactive process.   Methodology: Comment   Comment: (NOTE)  Genomic DNA was isolated from the provided specimen. Polymerase chain  reaction (PCR) of exon 9 of the CALR gene was performed with  specific fluorescent-labeled primers, and the PCR product was  analyzed by capillary gel electrophoresis to determine the size of  the PCR products. This PCR  assay is capable of detecting a mutant  cell population with a sensitivity of 5 mutant cells per 100 normal  cells. A negative result does not exclude the presence of a  myeloproliferative disorder or other neoplastic process.  This test was developed and its performance characteristics  determined by [facility]. It has not been cleared or approved by the  Food and Drug Administration. The FDA has determined that such  clearance or approval is not necessary.   References: Comment   Comment: (NOTE)  1. Klampfel, T. et al. (2013) Somatic mutations of calreticulin in   myeloproliferative neoplasms. New Engl. J. Med. 509:3267-1245.  2. Haynes Kerns et al. (2013) Somatic CALR mutations in   myeloproliferative neoplasms with nonmutated JAK2. New Engl. J.   Med. [phone].   Director Review Comment   Comment: (NOTE)  Constance Goltz, PhD, [facility]         Director, [place] for [place], [place]         [phone]   JAK2 Exons 12-15 Mut Det PCR: Comment   Comment: (NOTE)  NEGATIVE  JAK2 mutations were not detected in exons 12, 13, 14 and 15. This  result does not rule out the presence of JAK2 mutation at a level  below the detection sensitivity of this assay, the presence of  other mutations outside the analyzed region of the JAK2 gene, or  the presence of a myeloproliferative or other neoplasm. Result must  be correlated with other clinical data for the most accurate  diagnosis.   Indications NISPEC   Specimen Type Comment   Comment: No specimen type provided.  BACKGROUND: Comment   Comment: (NOTE)  JAK2 V617F mutation is detected in patients with polycythemia vera  (95%), essential thrombocythemia (50%) and primary myelofibrosis  (50%). A small percentage of JAK2 mutation positive patients (3.3%)  contain other non-V617F mutations within exons 12 to 15. The  detection of a JAK2 gene mutation aids in the specific diagnosis of a  myeloproliferative neoplasm, and help distinguish this clonal  disease from a benign reactive process.   Method Comment   Comment: (NOTE)  Total RNA was purified from the provided specimen. The JAK2 gene  region covering exons 12 to 15 was subjected to reverse-  transcription coupled PCR amplification, and bi-directional  sequencing to identify sequence variations. This assay has a  sensitivity to detect approximately 15% population of cells  containing the JAK2 mutations in a background of non-mutant cells.  This test was developed and its performance characteristics  determined by [facility]. It has not been cleared or approved by the  Food and Drug Administration.   References Comment   Comment: (NOTE)  Algasham, N. et al. Detection of mutations in JAK2 exons 12-15 by  Sanger sequencing. Int J Lab Hemato. 2015,  38:34-41.  Joelene Millin al. Mutation profile of JAK2 transcripts in patients with  chronic myeloproliferative neoplasias. J Mol Diagn. 2009, 11:49-53.   DIRECTOR REVIEW: Comment   Comment: (NOTE)  Loni Muse, PhD  Director, [place] for Molecular Biology and [place], [place] [address]  [phone]   MPL MUTATION ANALYSIS RESULT: Comment   Comment: (NOTE)  No MPL mutation was identified in the provided specimen of this  individual. Results should be interpreted in conjunction with  clinical and other laboratory findings for the most accurate  interpretation.   BACKGROUND: Comment   Comment: (NOTE)  MPL (myeloproliferative leukemia virus oncogene homology) belongs to  the hematopoietin superfamily and enables its ligand thrombopoietin  to facilitate both global hematopoiesis and megakaryocyte growth  and differentiation. MPL W515 mutations are present in patients with  primary myelofibrosis (PMF) and essential thrombocythemia (ET) at a  frequency of approximately 5% and 1% respectively. The S505  mutation is detected in patients with hereditary thrombocythemia.   METHODOLOGY: Comment   Comment: (NOTE)  Genomic DNA was purified from the provided specimen. MPL gene region  covering the S505N and W515L/K mutations were subjected to PCR  amplification and bi-directional sequencing in duplicate to  identify sequence variations. This assay has a sensitivity to detect  approximately 20-25% population of cells containing the MPL  mutations in a background of non-mutant cells. This assay will not  detect the mutation below the sensitivity of this assay. Molecular-  based testing is highly accurate, but as in any laboratory test, rare  diagnostic errors may occur.   REFERENCES: Comment   Comment: (NOTE)  1. Pardanani AD, et al. (2006). MPL515 mutations in   myeloproliferative and other myeloid disorders: a study   of 1182 patients. Blood  518:8416-6063.  2. Andre Lefort and Levine RL. (2008). JAK2 and MPL   mutations in myeloproliferative neoplasms: discovery and   science. Leukemia 22:1813-1817.  3. Juline Patch, et al. (2009). Evidence for a founder effect   of the MPL-S505N mutation in eight [place] pedigrees with   hereditary thrombocythemia. Haematologica 94(10):1368-   0160.   DIRECTOR REVIEW: Comment   Comment: (NOTE)  Loni Muse, PhD  Director, [place] for Molecular Biology and [place], [place] [address]  [phone]  This test was developed and its performance characteristics  determined by [facility]. It has not been cleared or approved  by the Food and Drug Administration.   Extraction Comment   Comment: (NOTE)  This sample has been received and DNA extraction has been performed.  Performed At: [facility]  [address] [place], [place] [id number]  Nechama Guard MD [id number]  Performed At: [facility]  [address] [place], [place] [id number]  Nechama Guard MD [phone]        US Abdomen Complete (Accession [id number]) (Order [id number])  Imaging  Date: 01/20/2017 Department: [facility]TRASOUND Released By: Georgianne Fick Authorizing: Forest Gleason, MD  Exam Information   Status Exam Begun  Exam Ended   Final [99] 01/20/2017 10:15 AM 01/20/2017 11:04 AM  PACS Images   Show images for US Abdomen Complete  Study Result   CLINICAL DATA:  Polycythemia vera.  EXAM: ABDOMEN ULTRASOUND COMPLETE  COMPARISON:  Ultrasound of Sep 10, 2008.  FINDINGS: Gallbladder: No gallstones or wall thickening visualized. No sonographic Murphy sign noted by sonographer.  Common bile duct: Diameter: 3.8 mm which is within normal limits.  Liver: No focal lesion identified. Increased echogenicity of hepatic parenchyma is noted suggesting fatty infiltration or other diffuse hepatocellular disease. Portal vein is patent on  color Doppler imaging with normal direction of blood flow towards the liver.  IVC: Not visualized due to overlying bowel gas.  Pancreas: Not visualized due to overlying bowel gas.  Spleen: Maximum measured diameter of 4.5 cm with calculated volume 449 cubic cm consistent with mild splenomegaly.  Right Kidney: Length: 10.9 cm. Echogenicity within normal limits. No mass or  hydronephrosis visualized.  Left Kidney: Length: 10.3 cm. Echogenicity within normal limits. No mass or hydronephrosis visualized.  Abdominal aorta: No aneurysm visualized.  Other findings: None.  IMPRESSION: IVC and pancreas not visualized due to overlying bowel gas.  Increased echogenicity of hepatic parenchyma is noted suggesting fatty infiltration or other diffuse hepatocellular disease.  Mild splenomegaly is noted.   Electronically Signed   By: Marijo Conception, M.D.   On: 01/20/2017 15:10     ASSESSMENT:  1. Polycythemia  most likely secondary to testosterone replacement therapy  After stopping the testosterone hematocrit has decreased   2. Positive BCR-ABL   PLAN:   Reviewed patient's previous workup including labs and abd [place] in detail with him. JAK2 with reflex to exon 12/MPL/CALR were all negative, indicating that his polycythemia is not a primary polycythemia vera. Interestingly a BCR-ABL was checked on his last visit and it came back positive b2a2 transcript of the BCR-ABL fusion gene at [employer]. 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 Patient has not had any evidence of leukocytosis. I have discussed a bone marrow biopsy for definitive diagnosis of CML and the patient is willing to proceed. A stat order for CT guided bone marrow biopsy by IR was placed today.  Patient expressed understanding and was in agreement with this plan. He also understands that He can call clinic at any time with any questions, concerns, or complaints.   RTC in 1 month to review bone marrow biopsy  results and to discuss the next plan of care. Labs on the next visit.  Orders Placed This Encounter  Procedures  . 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    Answer:   [facility]    Order Specific Question:   Radiology Contrast Protocol - do NOT remove file path    Answer:   \\[email or web address].[email or web address]  . 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    Answer:   [facility]    Order Specific Question:   Radiology Contrast Protocol - do NOT remove file path    Answer:   \\[email or web address].[email or web address]  . CBC with Differential    Standing Status:   Future    Standing Expiration Date:   02/08/2018  . Comprehensive metabolic panel    Standing Status:   Future    Standing Expiration Date:   02/08/2018    Twana First, MD   02/08/2017 12:02 PM

## 2017-02-15 ENCOUNTER — Other Ambulatory Visit: Payer: Self-pay | Admitting: Radiology

## 2017-02-16 ENCOUNTER — Other Ambulatory Visit: Payer: Self-pay | Admitting: Physician Assistant

## 2017-02-17 ENCOUNTER — Ambulatory Visit (HOSPITAL_COMMUNITY)
Admission: RE | Admit: 2017-02-17 | Discharge: 2017-02-17 | Disposition: A | Discharge status: Home / Self Care | Payer: PPO | Source: Ambulatory Visit | Attending: Oncology | Admitting: Oncology

## 2017-02-17 ENCOUNTER — Encounter (HOSPITAL_COMMUNITY): Payer: Self-pay

## 2017-02-17 ENCOUNTER — Inpatient Hospital Stay (HOSPITAL_COMMUNITY): Admission: RE | Admit: 2017-02-17 | Payer: Self-pay | Source: Ambulatory Visit

## 2017-02-17 DIAGNOSIS — Z880 Allergy status to penicillin: Secondary | ICD-10-CM | POA: Insufficient documentation

## 2017-02-17 DIAGNOSIS — D45 Polycythemia vera: Secondary | ICD-10-CM

## 2017-02-17 DIAGNOSIS — Z7982 Long term (current) use of aspirin: Secondary | ICD-10-CM | POA: Diagnosis not present

## 2017-02-17 DIAGNOSIS — M109 Gout, unspecified: Secondary | ICD-10-CM | POA: Insufficient documentation

## 2017-02-17 DIAGNOSIS — Z79899 Other long term (current) drug therapy: Secondary | ICD-10-CM | POA: Diagnosis not present

## 2017-02-17 DIAGNOSIS — D7589 Other specified diseases of blood and blood-forming organs: Secondary | ICD-10-CM | POA: Diagnosis not present

## 2017-02-17 DIAGNOSIS — F329 Major depressive disorder, single episode, unspecified: Secondary | ICD-10-CM | POA: Diagnosis not present

## 2017-02-17 DIAGNOSIS — Z833 Family history of diabetes mellitus: Secondary | ICD-10-CM | POA: Diagnosis not present

## 2017-02-17 DIAGNOSIS — Z87891 Personal history of nicotine dependence: Secondary | ICD-10-CM | POA: Insufficient documentation

## 2017-02-17 DIAGNOSIS — D751 Secondary polycythemia: Secondary | ICD-10-CM | POA: Insufficient documentation

## 2017-02-17 DIAGNOSIS — K219 Gastro-esophageal reflux disease without esophagitis: Secondary | ICD-10-CM | POA: Diagnosis not present

## 2017-02-17 DIAGNOSIS — I4891 Unspecified atrial fibrillation: Secondary | ICD-10-CM | POA: Diagnosis not present

## 2017-02-17 LAB — CBC WITH DIFFERENTIAL/PLATELET
Basophils Absolute: 0 10*3/uL (ref 0.0–0.1)
Basophils Relative: 0 %
Eosinophils Absolute: 0.1 10*3/uL (ref 0.0–0.7)
Eosinophils Relative: 1 %
HCT: 53.4 % — ABNORMAL HIGH (ref 39.0–52.0)
HEMOGLOBIN: 18.7 g/dL — AB (ref 13.0–17.0)
LYMPHS ABS: 1.3 10*3/uL (ref 0.7–4.0)
LYMPHS PCT: 15 %
MCH: 31.9 pg (ref 26.0–34.0)
MCHC: 35 g/dL (ref 30.0–36.0)
MCV: 91 fL (ref 78.0–100.0)
Monocytes Absolute: 0.9 10*3/uL (ref 0.1–1.0)
Monocytes Relative: 10 %
NEUTROS PCT: 74 %
Neutro Abs: 6.3 10*3/uL (ref 1.7–7.7)
Platelets: 200 10*3/uL (ref 150–400)
RBC: 5.87 MIL/uL — AB (ref 4.22–5.81)
RDW: 13.6 % (ref 11.5–15.5)
WBC: 8.6 10*3/uL (ref 4.0–10.5)

## 2017-02-17 LAB — BASIC METABOLIC PANEL
Anion gap: 10 (ref 5–15)
BUN: 19 mg/dL (ref 6–20)
CHLORIDE: 104 mmol/L (ref 101–111)
CO2: 25 mmol/L (ref 22–32)
Calcium: 9.5 mg/dL (ref 8.9–10.3)
Creatinine, Ser: 1.01 mg/dL (ref 0.61–1.24)
GFR calc non Af Amer: >60 mL/min (ref >60)
Glucose, Bld: 125 mg/dL — ABNORMAL HIGH (ref 65–99)
POTASSIUM: 4.5 mmol/L (ref 3.5–5.1)
SODIUM: 139 mmol/L (ref 135–145)

## 2017-02-17 LAB — PROTIME-INR
INR: 1.05
Prothrombin Time: 13.6 seconds (ref 11.4–15.2)

## 2017-02-17 MED ORDER — FENTANYL CITRATE (PF) 100 MCG/2ML IJ SOLN
INTRAMUSCULAR | Status: AC | PRN
  Administered 2017-02-17 (×2): 50 ug via INTRAVENOUS

## 2017-02-17 MED ORDER — SODIUM CHLORIDE 0.9 % IV SOLN
INTRAVENOUS | Status: DC
  Administered 2017-02-17: 08:00:00 via INTRAVENOUS

## 2017-02-17 MED ORDER — LIDOCAINE HCL 1 % IJ SOLN
INTRAMUSCULAR | Status: AC | PRN
  Administered 2017-02-17: 20 mL

## 2017-02-17 MED ORDER — MIDAZOLAM HCL 2 MG/2ML IJ SOLN
INTRAMUSCULAR | Status: AC
  Filled 2017-02-17: qty 4

## 2017-02-17 MED ORDER — MIDAZOLAM HCL 2 MG/2ML IJ SOLN
INTRAMUSCULAR | Status: AC | PRN
  Administered 2017-02-17 (×2): 1 mg via INTRAVENOUS

## 2017-02-17 MED ORDER — FENTANYL CITRATE (PF) 100 MCG/2ML IJ SOLN
INTRAMUSCULAR | Status: AC
  Filled 2017-02-17: qty 2

## 2017-02-17 MED ORDER — HYDROCODONE-ACETAMINOPHEN 5-325 MG PO TABS: 1.0000 | ORAL_TABLET | ORAL | Status: DC | PRN

## 2017-02-17 NOTE — Consult Note (Signed)
Chief Complaint: Patient was seen in consultation today for CT-guided bone marrow biopsy  Referring Physician(s): Zhou,Louise  Supervising Physician: Markus Daft  Patient Status: [facility] - Out-pt  History of Present Illness: Brandon Maddox is a 66 y.o. 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 He presents today for CT guided bone marrow biopsy to r/o CML.   Past Medical History:  Diagnosis Date  . Closed right scapular fracture   . Depression   . GERD (gastroesophageal reflux disease)   . Gout   . History of alcohol abuse   . Low testosterone   . 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 Ribs, multiple fractures   Afib  Past Surgical History:  Procedure Laterality Date  . HIP SURGERY  2010   Dr. Mayer Camel  . LACERATION REPAIR  05/03/2012   Procedure: REPAIR MULTIPLE LACERATIONS;  Surgeon: Jerrell Belfast, MD;  Location: [facility] OR;  Service: ENT;  Laterality: N/A;    Allergies: Penicillins  Medications: Prior to Admission medications   Medication Sig Start Date End Date Taking? Authorizing Provider  allopurinol (ZYLOPRIM) 300 MG tablet  04/29/15  Yes [provider]  aspirin EC 325 MG tablet Take 1 tablet (325 mg total) by mouth daily. 04/04/13  Yes Viyuoh, Adeline C, MD  buPROPion (WELLBUTRIN XL) 150 MG 24 hr tablet 450 mg.  08/26/15  Yes [provider]  metoprolol succinate (TOPROL XL) 25 MG 24 hr tablet Take 1 tablet (25 mg total) by mouth daily. 08/27/15  Yes Satira Sark, MD  omeprazole (PRILOSEC OTC) 20 MG tablet 20 mg delayed release tablet; oral every day; Dispense: 30 05/09/12  Yes [provider]  pseudoephedrine-guaifenesin (MUCINEX D) 60-600 MG 12 hr tablet Take 1 tablet by mouth every 12 (twelve) hours.   Yes [provider]  sertraline (ZOLOFT) 50 MG tablet Take 50 mg by mouth daily.   Yes [provider]  Tamsulosin HCl (FLOMAX) 0.4 MG CAPS Take 0.4 mg  by mouth daily after supper.    Yes [provider]  Multiple Vitamin (MULTIVITAMIN WITH MINERALS) TABS tablet Take 1 tablet by mouth daily. 04/04/13   Sheila Oats, MD     Family History  Problem Relation Age of Onset  . Diabetes Mother   . Diabetes Father     Social History   Social History  . Marital status: Divorced    Spouse name: N/A  . Number of children: 1  . Years of education: N/A   Occupational History  .  Self Employed   Social History Main Topics  . Smoking status: Former Smoker    Types: Cigarettes    Quit date: 04/20/2003  . Smokeless tobacco: Never Used  . Alcohol use 0.0 oz/week     Comment: History of alcohol abuse in the past  . Drug use: Unknown  . Sexual activity: Yes   Other Topics Concern  . None   Social History Narrative  . None     Review of Systems :denies fever, headache, chest pain, dyspnea, cough, abdominal/back pain, nausea, vomiting or bleeding  Vital Signs: BP 132/87 (BP Location: Left Arm)   Pulse 97   Temp 98.7 F (37.1 C) (Oral)   Resp 16   SpO2 98%   Physical Exam awake/alert; chest- CTA bilat; heart- nl rate, irreg rhythm; abd-soft,+BS,NT; no LE edema  Imaging: US Abdomen Complete  Result Date: 01/20/2017 CLINICAL DATA:  Polycythemia vera. EXAM: ABDOMEN ULTRASOUND COMPLETE  COMPARISON:  Ultrasound of Sep 10, 2008. FINDINGS: Gallbladder: No gallstones or wall thickening visualized. No sonographic Murphy sign noted by sonographer. Common bile duct: Diameter: 3.8 mm which is within normal limits. Liver: No focal lesion identified. Increased echogenicity of hepatic parenchyma is noted suggesting fatty infiltration or other diffuse hepatocellular disease. Portal vein is patent on color Doppler imaging with normal direction of blood flow towards the liver. IVC: Not visualized due to overlying bowel gas. Pancreas: Not visualized due to overlying bowel gas. Spleen: Maximum measured diameter of 4.5 cm with calculated volume  449 cubic cm consistent with mild splenomegaly. Right Kidney: Length: 10.9 cm. Echogenicity within normal limits. No mass or hydronephrosis visualized. Left Kidney: Length: 10.3 cm. Echogenicity within normal limits. No mass or hydronephrosis visualized. Abdominal aorta: No aneurysm visualized. Other findings: None. IMPRESSION: IVC and pancreas not visualized due to overlying bowel gas. Increased echogenicity of hepatic parenchyma is noted suggesting fatty infiltration or other diffuse hepatocellular disease. Mild splenomegaly is noted. Electronically Signed   By: Marijo Conception, M.D.   On: 01/20/2017 15:10    Labs:  CBC:  Recent Labs  01/17/17 0939 02/17/17 0721  WBC 6.6 8.6  HGB 19.1* 18.7*  HCT 54.3* 53.4*  PLT 169 200    COAGS:  Recent Labs  02/17/17 0721  INR 1.05    BMP:  Recent Labs  02/17/17 0721  NA 139  K 4.5  CL 104  CO2 25  GLUCOSE 125*  BUN 19  CALCIUM 9.5  CREATININE 1.01  GFRNONAA >60  GFRAA >60    LIVER FUNCTION TESTS: No results for input(s): BILITOT, AST, ALT, ALKPHOS, PROT, ALBUMIN in the last 8760 hours.  TUMOR MARKERS: No results for input(s): AFPTM, CEA, CA199, CHROMGRNA in the last 8760 hours.  Assessment and Plan: 66 y.o. 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 He presents today for CT guided bone marrow biopsy to r/o CML. Risks and benefits discussed with the patient including, but not limited to bleeding, infection, damage to adjacent structures or low yield requiring additional tests. All of the patient's questions were answered, patient is agreeable to proceed. Consent signed and in chart.      Thank you for this interesting consult.  I greatly enjoyed meeting Brandon Maddox and look forward to participating in their care.  A copy of this report was sent to the requesting provider on this date.  Electronically Signed: D. Rowe Robert, PA-C 02/17/2017, 8:16  AM   I spent a total of  20 minutes   in face to face in clinical consultation, greater than 50% of which was counseling/coordinating care for CT guided bone marrow biopsy

## 2017-02-17 NOTE — Procedures (Signed)
CT guided bone marrow biopsy.  2 aspirates and 1 core.  Minimal blood loss and no immediate complication. 

## 2017-02-17 NOTE — Discharge Instructions (Signed)
Your may remove the dressing in 24 hours.  You may shower after 24 hours, after you remove the dressing.  You do not have to put another dressing over the site once removed.    Bone Marrow Aspiration and Bone Marrow Biopsy, Adult, Care After This sheet gives you information about how to care for yourself after your procedure. Your health care provider may also give you more specific instructions. If you have problems or questions, contact your health care provider. What can I expect after the procedure? After the procedure, it is common to have:  Mild pain and tenderness.  Swelling.  Bruising.  Follow these instructions at home:  Take over-the-counter or prescription medicines only as told by your health care provider.  Do not take baths, swim, or use a hot tub until your health care provider approves. Ask if you can take a shower or have a sponge bath.  Follow instructions from your health care provider about how to take care of the puncture site. Make sure you: ? Wash your hands with soap and water before you change your bandage (dressing). If soap and water are not available, use hand sanitizer. ? Change your dressing as told by your health care provider.  Check your puncture siteevery day for signs of infection. Check for: ? More redness, swelling, or pain. ? More fluid or blood. ? Warmth. ? Pus or a bad smell.  Return to your normal activities as told by your health care provider. Ask your health care provider what activities are safe for you.  Do not drive for 24 hours if you were given a medicine to help you relax (sedative).  Keep all follow-up visits as told by your health care provider. This is important. Contact a health care provider if:  You have more redness, swelling, or pain around the puncture site.  You have more fluid or blood coming from the puncture site.  Your puncture site feels warm to the touch.  You have pus or a bad smell coming from the puncture  site.  You have a fever.  Your pain is not controlled with medicine. This information is not intended to replace advice given to you by your health care provider. Make sure you discuss any questions you have with your health care provider. Document Released: 10/23/2004 Document Revised: 10/24/2015 Document Reviewed: 09/17/2015 Elsevier Interactive Patient Education  2018 [place]. Moderate Conscious Sedation, Adult, Care After These instructions provide you with information about caring for yourself after your procedure. Your health care provider may also give you more specific instructions. Your treatment has been planned according to current medical practices, but problems sometimes occur. Call your health care provider if you have any problems or questions after your procedure. What can I expect after the procedure? After your procedure, it is common:  To feel sleepy for several hours.  To feel clumsy and have poor balance for several hours.  To have poor judgment for several hours.  To vomit if you eat too soon.  Follow these instructions at home: For at least 24 hours after the procedure:   Do not: ? Participate in activities where you could fall or become injured. ? Drive. ? Use heavy machinery. ? Drink alcohol. ? Take sleeping pills or medicines that cause drowsiness. ? Make important decisions or sign legal documents. ? Take care of children on your own.  Rest. Eating and drinking  Follow the diet recommended by your health care provider.  If you vomit: ?  Drink water, juice, or soup when you can drink without vomiting. ? Make sure you have little or no nausea before eating solid foods. General instructions  Have a responsible adult stay with you until you are awake and alert.  Take over-the-counter and prescription medicines only as told by your health care provider.  If you smoke, do not smoke without supervision.  Keep all follow-up visits as told by  your health care provider. This is important. Contact a health care provider if:  You keep feeling nauseous or you keep vomiting.  You feel light-headed.  You develop a rash.  You have a fever. Get help right away if:  You have trouble breathing. This information is not intended to replace advice given to you by your health care provider. Make sure you discuss any questions you have with your health care provider. Document Released: 01/24/2013 Document Revised: 09/08/2015 Document Reviewed: 07/26/2015 Elsevier Interactive Patient Education  [employer].

## 2017-03-04 ENCOUNTER — Encounter (HOSPITAL_COMMUNITY): Payer: Self-pay

## 2017-03-04 LAB — CHROMOSOME ANALYSIS, BONE MARROW

## 2017-03-08 ENCOUNTER — Encounter (HOSPITAL_COMMUNITY): Payer: PPO

## 2017-03-08 ENCOUNTER — Encounter (HOSPITAL_COMMUNITY): Payer: Self-pay | Admitting: Oncology

## 2017-03-08 ENCOUNTER — Encounter (HOSPITAL_COMMUNITY): Payer: PPO | Attending: Oncology | Admitting: Oncology

## 2017-03-08 VITALS — BP 121/81 | HR 88 | Temp 97.8°F | Resp 16 | Wt 213.6 lb

## 2017-03-08 DIAGNOSIS — Z23 Encounter for immunization: Secondary | ICD-10-CM | POA: Diagnosis not present

## 2017-03-08 DIAGNOSIS — D751 Secondary polycythemia: Secondary | ICD-10-CM | POA: Diagnosis not present

## 2017-03-08 DIAGNOSIS — D45 Polycythemia vera: Secondary | ICD-10-CM

## 2017-03-08 DIAGNOSIS — G4733 Obstructive sleep apnea (adult) (pediatric): Secondary | ICD-10-CM

## 2017-03-08 LAB — CBC WITH DIFFERENTIAL/PLATELET
BASOS ABS: 0 10*3/uL (ref 0.0–0.1)
BASOS PCT: 0 %
EOS ABS: 0.1 10*3/uL (ref 0.0–0.7)
EOS PCT: 1 %
HCT: 56.7 % — ABNORMAL HIGH (ref 39.0–52.0)
Hemoglobin: 18.9 g/dL — ABNORMAL HIGH (ref 13.0–17.0)
LYMPHS PCT: 26 %
Lymphs Abs: 2.2 10*3/uL (ref 0.7–4.0)
MCH: 31.5 pg (ref 26.0–34.0)
MCHC: 33.3 g/dL (ref 30.0–36.0)
MCV: 94.5 fL (ref 78.0–100.0)
Monocytes Absolute: 0.7 10*3/uL (ref 0.1–1.0)
Monocytes Relative: 8 %
Neutro Abs: 5.5 10*3/uL (ref 1.7–7.7)
Neutrophils Relative %: 64 %
PLATELETS: 255 10*3/uL (ref 150–400)
RBC: 6 MIL/uL — AB (ref 4.22–5.81)
RDW: 13.8 % (ref 11.5–15.5)
WBC: 8.5 10*3/uL (ref 4.0–10.5)

## 2017-03-08 LAB — COMPREHENSIVE METABOLIC PANEL
ALBUMIN: 4.1 g/dL (ref 3.5–5.0)
ALT: 18 U/L (ref 17–63)
AST: 17 U/L (ref 15–41)
Alkaline Phosphatase: 80 U/L (ref 38–126)
Anion gap: 7 (ref 5–15)
BUN: 23 mg/dL — AB (ref 6–20)
CHLORIDE: 100 mmol/L — AB (ref 101–111)
CO2: 29 mmol/L (ref 22–32)
CREATININE: 1.21 mg/dL (ref 0.61–1.24)
Calcium: 9.4 mg/dL (ref 8.9–10.3)
GFR calc Af Amer: >60 mL/min (ref >60)
GFR calc non Af Amer: >60 mL/min (ref >60)
GLUCOSE: 97 mg/dL (ref 65–99)
Potassium: 4.9 mmol/L (ref 3.5–5.1)
SODIUM: 136 mmol/L (ref 135–145)
Total Bilirubin: 0.9 mg/dL (ref 0.3–1.2)
Total Protein: 7.2 g/dL (ref 6.5–8.1)

## 2017-03-08 MED ORDER — INFLUENZA VAC SPLIT HIGH-DOSE 0.5 ML IM SUSY
0.5000 mL | PREFILLED_SYRINGE | Freq: Once | INTRAMUSCULAR | Status: AC
  Administered 2017-03-08: 0.5 mL via INTRAMUSCULAR
  Filled 2017-03-08: qty 0.5

## 2017-03-08 NOTE — Patient Instructions (Signed)
[facility]  Discharge Instructions: You saw Dr. Zhou today.  _______________________________________________________________  Thank you for choosing [facility] at [facility] to provide your oncology and hematology care.  To afford each patient quality time with our providers, please arrive at least 15 minutes before your scheduled appointment.  You need to re-schedule your appointment if you arrive 10 or more minutes late.  We strive to give you quality time with our providers, and arriving late affects you and other patients whose appointments are after yours.  Also, if you no show three or more times for appointments you may be dismissed from the clinic.  Again, thank you for choosing [facility] at [facility]. Our hope is that these requests will allow you access to exceptional care and in a timely manner. _______________________________________________________________  If you have questions after your visit, please contact our office at [phone] between the hours of 8:30 a.m. and 5:00 p.m. Voicemails left after 4:30 p.m. will not be returned until the following business day. _______________________________________________________________  For prescription refill requests, have your pharmacy contact our office. _______________________________________________________________  Recommendations made by the consultant and any test results will be sent to your referring physician. _______________________________________________________________ 

## 2017-03-08 NOTE — Progress Notes (Signed)
[place]  Brandon Maddox OB: 05/21/1950  MR#: [id number]  WUJ#:[id number]  Patient Care Team: [name], MD as PCP - General (Internal Medicine) Phylliss Bob, MD (Orthopedic Surgery)    VISIT DIAGNOSIS:  Secondary polycythemia  INTERVAL HISTORY: Patient presents today for review his bone marrow biopsy results.  There is that overall he has been doing well.  Is no new complaints today.  He denies any recent infections, chest pain, shortness of breath, abdominal pain, nausea, vomiting, diarrhea, focal weakness, headaches or blurry vision.  REVIEW OF SYSTEMS:    GENERAL:  Feels good.  Active.  No fevers, sweats or weight loss.  He still complains of fatigue. HEENT:  No visual changes, runny nose, sore throat, mouth sores or tenderness. Lungs: No shortness of breath or cough.  No hemoptysis. Cardiac:  No chest pain, palpitations, orthopnea, or PND.  Patient has atrial fibrillation GI:  No nausea, vomiting, diarrhea, constipation, melena or hematochezia. GU:  No urgency, frequency, dysuria, or hematuria.  Patient had a penile implants because of previous injury Was on testosterone replacement therapy which has been discontinued in July 2018 Musculoskeletal:  No back pain.  No joint pain.  No muscle tenderness. Extremities:  No pain or swelling. Skin:  No rashes or skin changes. Neuro:  No headache, numbness or weakness, balance or coordination issues. Endocrine:  No diabetes, thyroid issues, hot flashes or night sweats. Psych:  No mood changes, depression or anxiety. Pain:  No focal pain. Review of systems:  All other systems reviewed and found to be negative. As per HPI. Otherwise, a complete review of systems is negatve.  PAST MEDICAL HISTORY: Past Medical History:  Diagnosis Date  . Closed right scapular fracture   . Depression   . GERD (gastroesophageal reflux disease)   . Gout   . History of alcohol abuse   . Low testosterone   .  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 Ribs, multiple fractures     PAST SURGICAL HISTORY: Past Surgical History:  Procedure Laterality Date  . HIP SURGERY  2010   Dr. Mayer Camel  . REPAIR MULTIPLE LACERATIONS N/A 05/03/2012   Performed by Jerrell Belfast, MD at [facility]    FAMILY HISTORY Family History  Problem Relation Age of Onset  . Diabetes Mother   . Diabetes Father        ADVANCED DIRECTIVES:   Patient does NOT  have advance healthcare directive, Patient   does not desire to make any changes HEALTH MAINTENANCE: Social History   Tobacco Use  . Smoking status: Former Smoker    Types: Cigarettes    Last attempt to quit: 04/20/2003    Years since quitting: 13.8  . Smokeless tobacco: Never Used  Substance Use Topics  . Alcohol use: Yes    Alcohol/week: 0.0 oz    Comment: History of alcohol abuse in the past  . Drug use: Not on file     Colonoscopy:  PAP:  Bone density:  Lipid panel:  Allergies  Allergen Reactions  . Penicillins     Mother told him throat swelled up when he took at a young age     Current Outpatient Medications  Medication Sig Dispense Refill  . allopurinol (ZYLOPRIM) 300 MG tablet     . aspirin EC 325 MG tablet Take 1 tablet (325 mg total) by mouth daily. 30 tablet 0  . buPROPion (WELLBUTRIN XL) 150 MG 24 hr tablet 450 mg.  4  . metoprolol succinate (TOPROL XL) 25 MG 24 hr tablet Take 1 tablet (25 mg total) by mouth daily. 90 tablet 3  . Multiple Vitamin (MULTIVITAMIN WITH MINERALS) TABS tablet Take 1 tablet by mouth daily. 30 tablet 0  . omeprazole (PRILOSEC OTC) 20 MG tablet 20 mg delayed release tablet; oral every day; Dispense: 30    . pseudoephedrine-guaifenesin (MUCINEX D) 60-600 MG 12 hr tablet Take 1 tablet by mouth every 12 (twelve) hours.    . sertraline (ZOLOFT) 50 MG tablet Take 50 mg by mouth daily.    . Tamsulosin HCl (FLOMAX) 0.4 MG CAPS Take 0.4 mg by mouth daily after supper.      Current Facility-Administered Medications    Medication Dose Route Frequency Provider Last Rate Last Dose  . Influenza vac split quadrivalent PF (FLUZONE HIGH-DOSE) injection 0.5 mL  0.5 mL Intramuscular Once Twana First, MD        OBJECTIVE: Physical Exam  Vitals:   03/08/17 0952  BP: 121/81  Pulse: 88  Resp: 16  Temp: 97.8 F (36.6 C)  SpO2: 100%     Body mass index is 30.22 kg/m.    ECOG FS:0 - Asymptomatic  Constitutional: Well-developed, well-nourished, and in no distress.   HENT:  Head: Normocephalic and atraumatic.  Mouth/Throat: No oropharyngeal exudate. Mucosa moist. Eyes: Pupils are equal, round, and reactive to light. Conjunctivae are normal. No scleral icterus.  Neck: Normal range of motion. Neck supple. No JVD present.  Cardiovascular: Normal rate, regular rhythm and normal heart sounds.  Exam reveals no gallop and no friction rub.   No murmur heard. Pulmonary/Chest: Effort normal and breath sounds normal. No respiratory distress. No wheezes.No rales.  Abdominal: Soft. Bowel sounds are normal. No distension. There is no tenderness. There is no guarding.  Musculoskeletal: No edema or tenderness.  Lymphadenopathy:    No cervical or supraclavicular adenopathy.  Neurological: Alert and oriented to person, place, and time. No cranial nerve deficit.  Skin: Skin is warm and dry. No rash noted. No erythema. No pallor.  Psychiatric: Affect and judgment normal.   LAB RESULTS:  Appointment on 03/08/2017  Component Date Value Ref Range Status  . Sodium 03/08/2017 136  135 - 145 mmol/L Final  . Potassium 03/08/2017 4.9  3.5 - 5.1 mmol/L Final  . Chloride 03/08/2017 100* 101 - 111 mmol/L Final  . CO2 03/08/2017 29  22 - 32 mmol/L Final  . Glucose, Bld 03/08/2017 97  65 - 99 mg/dL Final  . BUN 03/08/2017 23* 6 - 20 mg/dL Final  . Creatinine, Ser 03/08/2017 1.21  0.61 - 1.24 mg/dL Final  . Calcium 03/08/2017 9.4  8.9 - 10.3 mg/dL Final  . Total Protein 03/08/2017 7.2  6.5 - 8.1 g/dL Final  . Albumin 03/08/2017  4.1  3.5 - 5.0 g/dL Final  . AST 03/08/2017 17  15 - 41 U/L Final  . ALT 03/08/2017 18  17 - 63 U/L Final  . Alkaline Phosphatase 03/08/2017 80  38 - 126 U/L Final  . Total Bilirubin 03/08/2017 0.9  0.3 - 1.2 mg/dL Final  . GFR calc non Af Amer 03/08/2017 >60  >60 mL/min Final  . GFR calc Af Amer 03/08/2017 >60  >60 mL/min Final   Comment: (NOTE) The eGFR has been calculated using the CKD EPI equation. This calculation has not been validated in all clinical situations. eGFR's persistently <60 mL/min signify possible Chronic Kidney Disease.   . Anion gap 03/08/2017 7  5 - 15  Final   BCR-ABL1, CML/ALL, PCR, QUANT  Order: [id number]  Status:  Edited Result - FINAL Visible to patient:  No (Not Released) Next appt:  02/17/2017 at 07:00 AM in [facility] Room) Dx:  Polycythemia vera (HCC)   Ref Range & Units 3wk ago  b2a2 transcript % 0.0624   b3a2 transcript % Comment   Comment: (NOTE)       <0.0032 %  (sensitivity limit of assay)   E1A2 Transcript % Comment   Comment: (NOTE)       <0.0032 %  (sensitivity limit of assay)   Interpretation (BCRAL):  ABL1 e13a2 (b2a2, p210) fusion transcript.   Comment: Comment  POSITIVE for the [place]   Director Review [facility]):  Comment   Comment: (NOTE)  Constance Goltz, PhD, [facility]         Director, [place] for [place] and [place], [place]         [phone]   Background:  [place]: (NOTE)  This assay can detect three different types of BCR-ABL1 fusion  transcripts associated with CML, ALL, and AML: e13a2 (previously  b2a2) and e14a2 (previously b3a2) (major breakpoint, p210), as  well as e1a2 (minor breakpoint, p190). The e13a2 and e14a2  transcript values are titrated to the current International Scale  (IS). The standardized baseline is 100% BCR-ABL1 (IS) and major  molecular response (MMR) is equivalent  to 0.1% BCR-ABL1 (IS)  corresponding to a 3-log reduction. Results should be correlated with  appropriate clinical and laboratory information as indicated.   Methodology  CommentVC   Comment: (NOTE)  Total RNA is isolated from the sample and subject to a real-time,  reverse transcriptase polymerase chain reaction (RT-PCR). The PCR  primers and probes are specific for BCR-ABL1 e13a2, e14a2 and e1a2  fusion transcripts. The ABL1 transcript is amplified as the control  for cDNA quantity and quality. Serial dilutions of a validated  positive control RNA with known t(9;22) BCR-ABL1 are used as  reference for quantification of BCR-ABL1 relative to ABL1. The  numeric BCR-ABL1 level is reportd as % BCR-ABL1/ABL1 and the  detection sensitivity is 4.5 log below the standard baseline.  This test was developed and its performance characteristics  determined by [facility]. It has not been cleared or approved by the  Food and Drug Administration.  References:   1. Anastasia Fiedler and Branford S: Seminars in Hematology 2003;     40 (suppl2):62-68.   2. White HE, et al. Blood 2010; 116: e111-117.   3. NCCN Clinical Practice Guidelines in Oncology, Chronic     Myeloid Leukemia. V2. 2017.  Performed At: [facility]  [address] [place], [place] [id number]  Nechama Guard MD WC:[id number]  Performed At: [facility]  [address] [place], [place] [id number]  Nechama Guard MD VQ:[id number]   Resulting Agency  [id number]    Specimen Collected: 01/17/17 09:38 Last Resulted: 01/24/17 16:33            VC=Value has a corrected status         Other Results from 01/17/2017   JAK2 V617F, w Reflex to CALR/E12/MPL  Order: [id number]   Status:  Edited Result - FINAL Visible to patient:  No (Not Released) Next appt:  02/17/2017 at 07:00 AM in No [facility]ty [facility] Room) Dx:  Polycythemia vera ([place])  Newer results are available. Click to view them now.  Component 3wk ago  JAK2  GenotypR Comment   Comment: (NOTE)  Result: NEGATIVE for the JAK2 V617F mutation.  Interpretation: The G to T nucleotide change encoding the V617F  mutation was not detected. This result does not rule out the  presence of the JAK2 mutation at a level below the sensitivity of  detection of this assay, or the presence of other mutations within  JAK2 not detected by this assay. This result does not rule out a  diagnosis of polycythemia vera, essential thrombocythemia or  idiopathic myelofibrosis as the V617F mutation is not detected in all  patients with these disorders.   BACKGROUND: Comment   Comment: (NOTE)  JAK2 is a cytoplasmic tyrosine kinase with a key role in signal  transduction from multiple hematopoietic growth factor receptors. A  point mutation within exon 14 of the JAK2 gene ([id number]) encoding a  valine to phenylalanine substitution at position 617 of the JAK2  protein (V617F) has been identified in most patients with  polycythemia vera, and in about half of those with either essential  thrombocythemia or idiopathic myelofibrosis. The V617F has also been  detected, although infrequently, in other myeloid disorders such as  chronic myelomonocytic leukemia and chronic neutrophilic luekemia.  V617F is an acquired mutation that alters a highly conserved valine  present in the negative regulatory JH2 domain of the JAK2 protein and  is predicted to dysregulate kinase activity.  Methodology:  Total genomic DNA was extracted and subjected to TaqMan real-time  PCR amplification/detection. Two amplification products per sample  were monitored by real-time PCR using primers/probes specific to JAK2  wild type (WT) and JAK2 mutant V617F. The ABI7900 Absolute  Quantitation software will compare the patient specimen valuse to the  standard curves and generate percent values for wild type and mutant  type. In vitro studies have indicated that this assay has an  analytical sensitivity of  1%.  References:  Baxter EJ, Scott Phineas Real, et al. Acquired mutation of the  tyrosine kinase JAK2 in human myeloproliferative disorders. Lancet.  2005 Mar 19-25; 365(9464):1054-1061.  Alfonso Ramus Couedic JP. A unique clonal JAK2 mutation  leading to constitutive signaling causes polycythaemia vera. Nature.  2005 Apr 28; 434(7037):1144-1148.  Kralovics R, Passamonti F, Buser AS, et al. A gain-of-function  mutation of JAK2 in myeloproliferative disorders. N Engl J Med.  2005 Apr 28; 352(17):1779-1790.   Director Review, JAK2 Comment   Comment: (NOTE)  Katina Degree, MD, PhD  Director, [place] for [facility] and [place], [place] [address]  [phone]  This test was developed and its performance characteristics  determined by [facility]. It has not been cleared or approved  by the Food and Drug Administration.   REFLEX: Comment   Comment: (NOTE)  Reflex to CALR Mutation Analysis, JAK2 Exon 12-15 Mutation Analysis,  and MPL Mutation Analysis is indicated.   Extraction CompletedVC   Comment: (NOTE)  Performed At: [facility]  [address] [place], [place] [id number]  Nechama Guard MD QJ:[id number]  Performed At: [facility]  [address] [place], [place] [id number]  Nechama Guard MD HF:[id number]   Resulting Agency [id number]    Specimen Collected: 01/17/17 09:38 Last Resulted: 01/27/17 17:30            VC=Value has a corrected status  Ferritin  Order: [id number]   Status:  Final result Visible to patient:  No (Not Released) Next appt:  02/17/2017 at 07:00 AM in No [facility] Room) Dx:  Polycythemia vera (HCC)   Ref Range & Units 3wk ago  Ferritin 24 - 336 ng/mL 203   Comment: Performed at [facility], [employer] [address], [address] [place], [place] [address]  Resulting Agency  [facility]    Specimen Collected: 01/17/17 09:38 Last Resulted: 01/17/17 16:25                      CALR + JAK2 E12-15 + MPL (reflexed)  Order: [id number]   Status:  Final result Visible to patient:  No (Not Released) Next appt:  02/17/2017 at 07:00 AM in No Specialty [facility] Room)  Component 3wk ago  CALR Mutation Detection Result Comment   Comment: (NOTE)  NEGATIVE  No insertions or deletions were detected within the analyzed region  of the calreticulin (CALR) gene.  A negative result does not entirely exclude the possibility of a  clonal population carrying CALR gene mutations that are not covered  by this assay. Results should be interpreted in conjunction with  clinical and laboratory findings for the most accurate  interpretation.   Background: Comment   Comment: (NOTE)  The calcium-binding endoplasmic reticulin chaperone protein,  calreticulin (CALR), is somatically mutated in approximately 70% of  patients with JAK2-negative essential thrombocythemia (ET) and 60-  88% of patients with JAK2-negative primary myelofibrosis(PMF). Only a  minority of patients (approximately 8%) with myelodysplasia have  mutations in CALR gene. CALR mutations are rarely detected in  patients with de novo acute myeloid leukemia, chronic myelogenous  leukemia, lymphoid leukemia, or solid tumors. CALR mutations are  not detected in polycythemia and generally appear to be mutually  exclusive with JAK2 mutations and MPL mutations.  The majority of mutational changes involve a variety of insertion or  deletion mutations in exon 9 of the calreticulin gene:  approximately 53% of all CALR mutations are a 52 bp deletion (type-1)  while the second most prevalent mutation (approximately 32%) contains  a 5 bp insertion (type-2). Other mutations (non-type 1 or type 2) are  seenin a small minority of cases. CALR mutations in PMF tend to be  associated with a favorable prognosis compared to JAK2 V617F  mutations, whereas primary myelofibrosis negative for CALR, JAK2  V617F and MPL mutations  (so-called triple negative) is associated  with a poor prognosis and shorter survival.  The detection of a CALR gene mutation aids in the specific diagnosis  of a myeloproliferative neoplasm, and help distinguish this clonal  disease from a benign reactive process.   Methodology: Comment   Comment: (NOTE)  Genomic DNA was isolated from the provided specimen. Polymerase chain  reaction (PCR) of exon 9 of the CALR gene was performed with  specific fluorescent-labeled primers, and the PCR product was  analyzed by capillary gel electrophoresis to determine the size of  the PCR products. This PCR assay is capable of detecting a mutant  cell population with a sensitivity of 5 mutant cells per 100 normal  cells. A negative result does not exclude the presence of a  myeloproliferative disorder or other neoplastic process.  This test was developed and its performance characteristics  determined by [facility]. It has not been cleared or approved by the  Food and Drug Administration. The FDA has determined that such  clearance or approval is not necessary.   References: Comment  Comment: (NOTE)  1. Klampfel, T. et al. (2013) Somatic mutations of calreticulin in   myeloproliferative neoplasms. New Engl. J. Med. 256:3893-7342.  2. Haynes Kerns et al. (2013) Somatic CALR mutations in   myeloproliferative neoplasms with nonmutated JAK2. New Engl. J.   Med. [phone].   Director Review Comment   Comment: (NOTE)  Constance Goltz, PhD, [facility]         Director, [place] for [place], [place]         [phone]   JAK2 Exons 12-15 Mut Det PCR: Comment   Comment: (NOTE)  NEGATIVE  JAK2 mutations were not detected in exons 12, 13, 14 and 15. This  result does not rule out the presence of JAK2 mutation at a level  below the detection sensitivity of this assay, the presence of    other mutations outside the analyzed region of the JAK2 gene, or  the presence of a myeloproliferative or other neoplasm. Result must  be correlated with other clinical data for the most accurate  diagnosis.   Indications NISPEC   Specimen Type Comment   Comment: No specimen type provided.  BACKGROUND: Comment   Comment: (NOTE)  JAK2 V617F mutation is detected in patients with polycythemia vera  (95%), essential thrombocythemia (50%) and primary myelofibrosis  (50%). A small percentage of JAK2 mutation positive patients (3.3%)  contain other non-V617F mutations within exons 12 to 15. The  detection of a JAK2 gene mutation aids in the specific diagnosis of a  myeloproliferative neoplasm, and help distinguish this clonal  disease from a benign reactive process.   Method Comment   Comment: (NOTE)  Total RNA was purified from the provided specimen. The JAK2 gene  region covering exons 12 to 15 was subjected to reverse-  transcription coupled PCR amplification, and bi-directional  sequencing to identify sequence variations. This assay has a  sensitivity to detect approximately 15% population of cells  containing the JAK2 mutations in a background of non-mutant cells.  This test was developed and its performance characteristics  determined by [facility]. It has not been cleared or approved by the  Food and Drug Administration.   References Comment   Comment: (NOTE)  Algasham, N. et al. Detection of mutations in JAK2 exons 12-15 by  Sanger sequencing. Int J Lab Hemato. 2015, 38:34-41.  Joelene Millin al. Mutation profile of JAK2 transcripts in patients with  chronic myeloproliferative neoplasias. J Mol Diagn. 2009, 11:49-53.   DIRECTOR REVIEW: Comment   Comment: (NOTE)  Loni Muse, PhD  Director, [place] for [facility] and [place], [place] [address]  [phone]   MPL MUTATION ANALYSIS RESULT: Comment   Comment: (NOTE)  No MPL  mutation was identified in the provided specimen of this  individual. Results should be interpreted in conjunction with  clinical and other laboratory findings for the most accurate  interpretation.   BACKGROUND: Comment   Comment: (NOTE)  MPL (myeloproliferative leukemia virus oncogene homology) belongs to  the hematopoietin superfamily and enables its ligand thrombopoietin  to facilitate both global hematopoiesis and megakaryocyte growth  and differentiation. MPL W515 mutations are present in patients with  primary myelofibrosis (PMF) and essential thrombocythemia (ET) at a  frequency of approximately 5% and 1% respectively. The S505  mutation is detected in patients with  hereditary thrombocythemia.   METHODOLOGY: Comment   Comment: (NOTE)  Genomic DNA was purified from the provided specimen. MPL gene region  covering the S505N and W515L/K mutations were subjected to PCR  amplification and bi-directional sequencing in duplicate to  identify sequence variations. This assay has a sensitivity to detect  approximately 20-25% population of cells containing the MPL  mutations in a background of non-mutant cells. This assay will not  detect the mutation below the sensitivity of this assay. Molecular-  based testing is highly accurate, but as in any laboratory test, rare  diagnostic errors may occur.   REFERENCES: Comment   Comment: (NOTE)  1. Pardanani AD, et al. (2006). MPL515 mutations in   myeloproliferative and other myeloid disorders: a study   of 1182 patients. Blood 672:0947-0962.  2. Andre Lefort and Levine RL. (2008). JAK2 and MPL   mutations in myeloproliferative neoplasms: discovery and   science. Leukemia 22:1813-1817.  3. Juline Patch, et al. (2009). Evidence for a founder effect   of the MPL-S505N mutation in eight [place] pedigrees with   hereditary thrombocythemia. Haematologica 94(10):1368-   8366.   DIRECTOR REVIEW: Comment   Comment: (NOTE)  Loni Muse, PhD    Director, [place] for [facility] and [place], [place] [address]  [phone]  This test was developed and its performance characteristics  determined by [facility]. It has not been cleared or approved  by the Food and Drug Administration.   Extraction Comment   Comment: (NOTE)  This sample has been received and DNA extraction has been performed.  Performed At: [facility]  [address] [place], [place] [id number]  Nechama Guard MD FV:[id number]  Performed At: [facility] RT[facility]  [address] [place], [place] [id number]  Nechama Guard MD Ph:[phone]        US Abdomen Complete (Accession [id number]) (Order [id number])  Imaging  Date: 01/20/2017 Department: [facility] ULTRASOUND Released By: Georgianne Fick Authorizing: Forest Gleason, MD  Exam Information   Status Exam Begun  Exam Ended   Final [99] 01/20/2017 10:15 AM 01/20/2017 11:04 AM  PACS Images   Show images for US Abdomen Complete  Study Result   CLINICAL DATA:  Polycythemia vera.  EXAM: ABDOMEN ULTRASOUND COMPLETE  COMPARISON:  Ultrasound of Sep 10, 2008.  FINDINGS: Gallbladder: No gallstones or wall thickening visualized. No sonographic Murphy sign noted by sonographer.  Common bile duct: Diameter: 3.8 mm which is within normal limits.  Liver: No focal lesion identified. Increased echogenicity of hepatic parenchyma is noted suggesting fatty infiltration or other diffuse hepatocellular disease. Portal vein is patent on color Doppler imaging with normal direction of blood flow towards the liver.  IVC: Not visualized due to overlying bowel gas.  Pancreas: Not visualized due to overlying bowel gas.  Spleen: Maximum measured diameter of 4.5 cm with calculated volume 449 cubic cm consistent with mild splenomegaly.  Right Kidney: Length: 10.9 cm. Echogenicity within normal limits. No mass or hydronephrosis visualized.  Left  Kidney: Length: 10.3 cm. Echogenicity within normal limits. No mass or hydronephrosis visualized.  Abdominal aorta: No aneurysm visualized.  Other findings: None.  IMPRESSION: IVC and pancreas not visualized due to overlying bowel gas.  Increased echogenicity of hepatic parenchyma is noted suggesting fatty infiltration or other diffuse hepatocellular disease.  Mild splenomegaly is noted.   Electronically Signed   By: Marijo Conception, M.D.   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 - SEE COMMENT. PERIPHERAL BLOOD: - ERYTHROCYTOSIS. Diagnosis Note The presence of BCR-ABL1 as performed on peripheral blood is noted, but there is no diagnostic or definitive morphologic evidence of a myeloproliferative or myelodysplastic neoplasm at this time. Clinical and cytogenetic correlation is recommended. (BNS:ah 02/18/17) Susanne Greenhouse MD Pathologist, Electronic Signature (Case signed 02/18/2017) Cytogenetics: normal male karyotype  ASSESSMENT:  1. Polycythemia  most likely secondary to testosterone replacement therapy  After stopping the testosterone hematocrit has decreased, as well as untreated OSA. Patient is supposed to be using CPAP but never got the machine.  2. Positive BCR-ABL -bone marrow biopsy negative for myeloproliferative or myelodysplastic neoplasm   PLAN:   Reviewed patient's previous workup including labs and abd [place] in detail with him.  JAK2 with reflex to exon 12/MPL/CALR were all negative, indicating that his polycythemia is not a primary polycythemia vera.  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 Patient has not had any evidence of leukocytosis.  Reviewed bone marrow  results with him today. Bone marrow biopsy demonstrated no diagnostic or definitive morphologic evidence of a myeloproliferative or myelodysplastic neoplasm at this time. I discussed this over the phone with Dr. Gari Crown, the pathologist who read the patient's bone marrow, and he did not believe the patient had CML despite the positive BCR-ABL.   I have recommended that he get re-evaluated for a CPAP machine for treatment of OSA since this may be a big contributing factor to his secondary polycythemia.  Patient expressed understanding and was in agreement with this plan. He also understands that He can call clinic at any time with any questions, concerns, or complaints.   RTC in 3 months for follow up. Labs on the next visit.  Orders Placed This Encounter  Procedures  . CBC with Differential    Standing Status:   Future    Standing Expiration Date:   03/08/2018  . Comprehensive metabolic panel    Standing Status:   Future    Standing Expiration Date:   03/08/2018    Twana First, MD   03/08/2017 10:02 AM

## 2017-03-15 DIAGNOSIS — D751 Secondary polycythemia: Secondary | ICD-10-CM | POA: Diagnosis not present

## 2017-03-15 DIAGNOSIS — I482 Chronic atrial fibrillation: Secondary | ICD-10-CM | POA: Diagnosis not present

## 2017-03-15 DIAGNOSIS — F1021 Alcohol dependence, in remission: Secondary | ICD-10-CM | POA: Diagnosis not present

## 2017-05-31 ENCOUNTER — Other Ambulatory Visit (HOSPITAL_COMMUNITY): Payer: Self-pay | Admitting: *Deleted

## 2017-05-31 DIAGNOSIS — D45 Polycythemia vera: Secondary | ICD-10-CM

## 2017-06-01 ENCOUNTER — Other Ambulatory Visit (HOSPITAL_COMMUNITY): Payer: Self-pay | Admitting: Adult Health

## 2017-06-01 ENCOUNTER — Inpatient Hospital Stay (HOSPITAL_COMMUNITY): Payer: PPO | Attending: Internal Medicine

## 2017-06-01 DIAGNOSIS — G4730 Sleep apnea, unspecified: Secondary | ICD-10-CM | POA: Diagnosis not present

## 2017-06-01 DIAGNOSIS — D45 Polycythemia vera: Secondary | ICD-10-CM

## 2017-06-01 DIAGNOSIS — Z7982 Long term (current) use of aspirin: Secondary | ICD-10-CM | POA: Diagnosis not present

## 2017-06-01 DIAGNOSIS — R161 Splenomegaly, not elsewhere classified: Secondary | ICD-10-CM | POA: Diagnosis not present

## 2017-06-01 DIAGNOSIS — M109 Gout, unspecified: Secondary | ICD-10-CM | POA: Insufficient documentation

## 2017-06-01 DIAGNOSIS — Z8781 Personal history of (healed) traumatic fracture: Secondary | ICD-10-CM | POA: Diagnosis not present

## 2017-06-01 DIAGNOSIS — Z79899 Other long term (current) drug therapy: Secondary | ICD-10-CM | POA: Insufficient documentation

## 2017-06-01 DIAGNOSIS — E291 Testicular hypofunction: Secondary | ICD-10-CM | POA: Diagnosis not present

## 2017-06-01 DIAGNOSIS — R531 Weakness: Secondary | ICD-10-CM | POA: Diagnosis not present

## 2017-06-01 DIAGNOSIS — F1021 Alcohol dependence, in remission: Secondary | ICD-10-CM | POA: Insufficient documentation

## 2017-06-01 DIAGNOSIS — F329 Major depressive disorder, single episode, unspecified: Secondary | ICD-10-CM | POA: Insufficient documentation

## 2017-06-01 DIAGNOSIS — K219 Gastro-esophageal reflux disease without esophagitis: Secondary | ICD-10-CM | POA: Diagnosis not present

## 2017-06-01 DIAGNOSIS — Z87891 Personal history of nicotine dependence: Secondary | ICD-10-CM | POA: Diagnosis not present

## 2017-06-01 DIAGNOSIS — R5383 Other fatigue: Secondary | ICD-10-CM | POA: Diagnosis not present

## 2017-06-01 LAB — CBC WITH DIFFERENTIAL/PLATELET
BASOS PCT: 0 %
Basophils Absolute: 0 10*3/uL (ref 0.0–0.1)
EOS ABS: 0.1 10*3/uL (ref 0.0–0.7)
Eosinophils Relative: 1 %
HCT: 57 % — ABNORMAL HIGH (ref 39.0–52.0)
Hemoglobin: 19.4 g/dL — ABNORMAL HIGH (ref 13.0–17.0)
Lymphocytes Relative: 32 %
Lymphs Abs: 1.9 10*3/uL (ref 0.7–4.0)
MCH: 30.9 pg (ref 26.0–34.0)
MCHC: 34 g/dL (ref 30.0–36.0)
MCV: 90.8 fL (ref 78.0–100.0)
MONO ABS: 0.4 10*3/uL (ref 0.1–1.0)
MONOS PCT: 6 %
NEUTROS PCT: 61 %
Neutro Abs: 3.6 10*3/uL (ref 1.7–7.7)
PLATELETS: 138 10*3/uL — AB (ref 150–400)
RBC: 6.28 MIL/uL — ABNORMAL HIGH (ref 4.22–5.81)
RDW: 13.4 % (ref 11.5–15.5)
WBC: 5.9 10*3/uL (ref 4.0–10.5)

## 2017-06-01 LAB — COMPREHENSIVE METABOLIC PANEL
ALBUMIN: 4.2 g/dL (ref 3.5–5.0)
ALT: 26 U/L (ref 17–63)
ANION GAP: 12 (ref 5–15)
AST: 23 U/L (ref 15–41)
Alkaline Phosphatase: 71 U/L (ref 38–126)
BUN: 16 mg/dL (ref 6–20)
CO2: 25 mmol/L (ref 22–32)
Calcium: 9.5 mg/dL (ref 8.9–10.3)
Chloride: 98 mmol/L — ABNORMAL LOW (ref 101–111)
Creatinine, Ser: 1.07 mg/dL (ref 0.61–1.24)
GFR calc Af Amer: >60 mL/min (ref >60)
GFR calc non Af Amer: >60 mL/min (ref >60)
Glucose, Bld: 134 mg/dL — ABNORMAL HIGH (ref 65–99)
POTASSIUM: 4.6 mmol/L (ref 3.5–5.1)
SODIUM: 135 mmol/L (ref 135–145)
Total Bilirubin: 0.8 mg/dL (ref 0.3–1.2)
Total Protein: 7.3 g/dL (ref 6.5–8.1)

## 2017-06-02 ENCOUNTER — Other Ambulatory Visit (HOSPITAL_COMMUNITY): Payer: PPO

## 2017-06-03 ENCOUNTER — Encounter (HOSPITAL_COMMUNITY)
Admission: RE | Admit: 2017-06-03 | Discharge: 2017-06-03 | Disposition: A | Discharge status: Home / Self Care | Payer: PPO | Source: Ambulatory Visit | Attending: Internal Medicine | Admitting: Internal Medicine

## 2017-06-03 DIAGNOSIS — D45 Polycythemia vera: Secondary | ICD-10-CM | POA: Insufficient documentation

## 2017-06-03 NOTE — Progress Notes (Signed)
Brandon Maddox presents today for phlebotomy per MD orders. HGB/HCT:19.4/57% Phlebotomy procedure started at 0850 and ended at 0900. 570 cc removed. Patient tolerated procedure well. IV needle removed intact from R [facility]

## 2017-06-08 ENCOUNTER — Encounter (HOSPITAL_COMMUNITY): Payer: Self-pay | Admitting: Adult Health

## 2017-06-08 ENCOUNTER — Inpatient Hospital Stay (HOSPITAL_BASED_OUTPATIENT_CLINIC_OR_DEPARTMENT_OTHER): Payer: PPO | Admitting: Adult Health

## 2017-06-08 VITALS — BP 119/79 | HR 81 | Temp 98.2°F | Resp 16 | Wt 221.2 lb

## 2017-06-08 DIAGNOSIS — Z87891 Personal history of nicotine dependence: Secondary | ICD-10-CM

## 2017-06-08 DIAGNOSIS — R5383 Other fatigue: Secondary | ICD-10-CM | POA: Diagnosis not present

## 2017-06-08 DIAGNOSIS — R161 Splenomegaly, not elsewhere classified: Secondary | ICD-10-CM | POA: Diagnosis not present

## 2017-06-08 DIAGNOSIS — Z8781 Personal history of (healed) traumatic fracture: Secondary | ICD-10-CM

## 2017-06-08 DIAGNOSIS — M109 Gout, unspecified: Secondary | ICD-10-CM | POA: Diagnosis not present

## 2017-06-08 DIAGNOSIS — E291 Testicular hypofunction: Secondary | ICD-10-CM

## 2017-06-08 DIAGNOSIS — K219 Gastro-esophageal reflux disease without esophagitis: Secondary | ICD-10-CM

## 2017-06-08 DIAGNOSIS — F1021 Alcohol dependence, in remission: Secondary | ICD-10-CM

## 2017-06-08 DIAGNOSIS — Z7982 Long term (current) use of aspirin: Secondary | ICD-10-CM

## 2017-06-08 DIAGNOSIS — G4730 Sleep apnea, unspecified: Secondary | ICD-10-CM

## 2017-06-08 DIAGNOSIS — D45 Polycythemia vera: Secondary | ICD-10-CM | POA: Diagnosis not present

## 2017-06-08 DIAGNOSIS — R531 Weakness: Secondary | ICD-10-CM | POA: Diagnosis not present

## 2017-06-08 DIAGNOSIS — F329 Major depressive disorder, single episode, unspecified: Secondary | ICD-10-CM | POA: Diagnosis not present

## 2017-06-08 DIAGNOSIS — Z79899 Other long term (current) drug therapy: Secondary | ICD-10-CM

## 2017-06-08 DIAGNOSIS — D751 Secondary polycythemia: Secondary | ICD-10-CM

## 2017-06-08 NOTE — Patient Instructions (Signed)
[facility] at [facility] Discharge Instructions  RECOMMENDATIONS MADE BY THE CONSULTANT AND ANY TEST RESULTS WILL BE SENT TO YOUR REFERRING PHYSICIAN.  You saw Gretchen Dawson, NP, today See Amy at checkout for appointments.   Thank you for choosing [facility] at [facility] to provide your oncology and hematology care.  To afford each patient quality time with our provider, please arrive at least 15 minutes before your scheduled appointment time.    If you have a lab appointment with the Cancer Center please come in thru the  Main Entrance and check in at the main information desk  You need to re-schedule your appointment should you arrive 10 or more minutes late.  We strive to give you quality time with our providers, and arriving late affects you and other patients whose appointments are after yours.  Also, if you no show three or more times for appointments you may be dismissed from the clinic at the providers discretion.     Again, thank you for choosing [facility].  Our hope is that these requests will decrease the amount of time that you wait before being seen by our physicians.       _____________________________________________________________  Should you have questions after your visit to [facility], please contact our office at [phone] between the hours of 8:30 a.m. and 4:30 p.m.  Voicemails left after 4:30 p.m. will not be returned until the following business day.  For prescription refill requests, have your pharmacy contact our office.       Resources For Cancer Patients and their Caregivers ? American Cancer Society: Can assist with transportation, wigs, general needs, runs Look Good Feel Better.        1-888-227-6333 ? Cancer Care: Provides financial assistance, online support groups, medication/co-pay assistance.  1-800-813-HOPE (4673) ? [facility] Assists  [place] cancer patients and their families through emotional , educational and financial support.  336-427-4357 ? [place] DSS Where to apply for food stamps, Medicaid and utility assistance. 336-342-1394 ? [facility]: Transportation to medical appointments. 336-347-2287 ? Social Security Administration: May apply for disability if have a Stage IV cancer. 336-342-7796 1-800-772-1213 ? [place] Aging, Disability and Transit Services: Assists with nutrition, care and transit needs. 336-349-2343  Cancer Center Support Programs: @10RELATIVEDAYS@ > Cancer Support Group  2nd Tuesday of the month 1pm-2pm, Journey Room  > Creative Journey  3rd Tuesday of the month 1130am-1pm, Journey Room  > Look Good Feel Better  1st Wednesday of the month 10am-12 noon, Journey Room (Call American Cancer Society to register 1-800-395-5775)    

## 2017-06-08 NOTE — Progress Notes (Signed)
Brandon Maddox, [place] [id number]   CLINIC:  Medical Oncology/Hematology  PCP:  [name], MD [address] [place] [address] [phone]   REASON FOR VISIT:  Follow-up for Secondary polycythemia   CURRENT THERAPY: Observation/Therapeutic phlebotomy PRN    HISTORY OF PRESENT ILLNESS:  (From Dr. Laverle Patter note on 03/08/17)      INTERVAL HISTORY:  Brandon Maddox 67 y.o. male returns for routine follow-up for polycythemia.   Here today unaccompanied.   Overall, he tells me he has been feeling "okay."  Appetite 100%; energy levels 50%.  He underwent therapeutic phlebotomy recently on 06/03/17. Symptomatically, he feels like his fatigue is slightly improved since that procedure.    He tells me he understands he has h/o sleep apnea diagnosed on remote sleep study. He missed his follow-up appt with sleep study provider to get him set-up with necessary CPAP machine and supplies.  He has not returned back to sleep study specialist since that time.    His biggest complains today are fatigue and weakness. Denies any pain. Does report easy bleeding/bruising, but denies any frank bleeding episodes including blood in his stools, hematuria, nosebleeds, etc.    Otherwise, he is largely without other complaints today.   He recently retired ~6 months ago. He worked for the past 50+ years in [employer]. 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     REVIEW OF SYSTEMS:  Review of Systems  Constitutional: Positive for fatigue. Negative for chills and fever.  HENT:  Negative.   Eyes: Negative.   Respiratory: Negative.  Negative for cough and shortness of breath.   Cardiovascular: Negative.  Negative for chest pain and leg swelling.  Gastrointestinal: Negative.   Endocrine: Negative.   Genitourinary: Negative.    Musculoskeletal: Negative.   Skin: Negative.   Neurological: Negative.     Hematological: Bruises/bleeds easily.  Psychiatric/Behavioral: Negative.      PAST MEDICAL/SURGICAL HISTORY:  Past Medical History:  Diagnosis Date  . Closed right scapular fracture   . Depression   . GERD (gastroesophageal reflux disease)   . Gout   . History of alcohol abuse   . Low testosterone   . 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 Ribs, multiple fractures    Past Surgical History:  Procedure Laterality Date  . HIP SURGERY  2010   Dr. Mayer Camel  . LACERATION REPAIR  05/03/2012   Procedure: REPAIR MULTIPLE LACERATIONS;  Surgeon: Jerrell Belfast, MD;  Location: [facility] OR;  Service: ENT;  Laterality: N/A;     SOCIAL HISTORY:  Social History   Socioeconomic History  . Marital status: Divorced    Spouse name: Not on file  . Number of children: 1  . Years of education: Not on file  . Highest education level: Not on file  Social Needs  . Financial resource strain: Not on file  . Food insecurity - worry: Not on file  . Food insecurity - inability: Not on file  . Transportation needs - medical: Not on file  . Transportation needs - non-medical: Not on file  Occupational History    Employer: SELF EMPLOYED  Tobacco Use  . Smoking status: Former Smoker    Types: Cigarettes    Last attempt to quit: 04/20/2003    Years since quitting: 14.1  . Smokeless tobacco: Never Used  Substance and Sexual Activity  . Alcohol use: Yes    Alcohol/week: 0.0 oz  Comment: History of alcohol abuse in the past  . Drug use: Not on file  . Sexual activity: Yes  Other Topics Concern  . Not on file  Social History Narrative  . Not on file    FAMILY HISTORY:  Family History  Problem Relation Age of Onset  . Diabetes Mother   . Diabetes Father     CURRENT MEDICATIONS:  Outpatient Encounter Medications as of 06/08/2017  Medication Sig Note  . allopurinol (ZYLOPRIM) 300 MG tablet  08/27/2015: Received from: [facility]  . aspirin EC 325 MG tablet Take 1 tablet (325 mg  total) by mouth daily.   Marland Kitchen buPROPion (WELLBUTRIN XL) 150 MG 24 hr tablet 450 mg.  08/27/2015: Received from: External Pharmacy  . metoprolol succinate (TOPROL XL) 25 MG 24 hr tablet Take 1 tablet (25 mg total) by mouth daily.   . Multiple Vitamin (MULTIVITAMIN WITH MINERALS) TABS tablet Take 1 tablet by mouth daily. 02/17/2017: Patient not taking  . omeprazole (PRILOSEC OTC) 20 MG tablet 20 mg delayed release tablet; oral every day; Dispense: 30   . pseudoephedrine-guaifenesin (MUCINEX D) 60-600 MG 12 hr tablet Take 1 tablet by mouth every 12 (twelve) hours.   . sertraline (ZOLOFT) 50 MG tablet Take 50 mg by mouth daily.   . Tamsulosin HCl (FLOMAX) 0.4 MG CAPS Take 0.4 mg by mouth daily after supper.     No facility-administered encounter medications on file as of 06/08/2017.     ALLERGIES:  Allergies  Allergen Reactions  . Penicillins     Mother told him throat swelled up when he took at a young age      PHYSICAL EXAM:  ECOG Performance status: 0-1 - Mildly symptomatic; remains independent   Vitals:   06/08/17 1023  BP: 119/79  Pulse: 81  Resp: 16  Temp: 98.2 F (36.8 C)  SpO2: 100%   Filed Weights   06/08/17 1023  Weight: 221 lb 3.2 oz (100.3 kg)    Physical Exam  Constitutional: He is oriented to person, place, and time and well-developed, well-nourished, and in no distress.  HENT:  Head: Normocephalic.  Mouth/Throat: Oropharynx is clear and moist. No oropharyngeal exudate.  Eyes: Conjunctivae are normal. Pupils are equal, round, and reactive to light. No scleral icterus.  Neck: Normal range of motion. Neck supple.  Cardiovascular: Normal rate.  Irregularly regular   Pulmonary/Chest: Effort normal and breath sounds normal. No respiratory distress.  Abdominal: Soft. Bowel sounds are normal. There is no tenderness.  Musculoskeletal: Normal range of motion. He exhibits no edema.  Lymphadenopathy:    He has no cervical adenopathy.       Right: No supraclavicular  adenopathy present.       Left: No supraclavicular adenopathy present.  Neurological: He is alert and oriented to person, place, and time. No cranial nerve deficit. Gait normal.  Skin: Skin is warm and dry. No rash noted.  Psychiatric: Mood, memory, affect and judgment normal.  Nursing note and vitals reviewed.    LABORATORY DATA:  I have reviewed the labs as listed.  CBC    Component Value Date/Time   WBC 5.9 06/01/2017 0851   RBC 6.28 (H) 06/01/2017 0851   HGB 19.4 (H) 06/01/2017 0851   HCT 57.0 (H) 06/01/2017 0851   PLT 138 (L) 06/01/2017 0851   MCV 90.8 06/01/2017 0851   MCH 30.9 06/01/2017 0851   MCHC 34.0 06/01/2017 0851   RDW 13.4 06/01/2017 0851   LYMPHSABS 1.9 06/01/2017 0865  MONOABS 0.4 06/01/2017 0851   EOSABS 0.1 06/01/2017 0851   BASOSABS 0.0 06/01/2017 0851   CMP Latest Ref Rng & Units 06/01/2017 03/08/2017 02/17/2017  Glucose 65 - 99 mg/dL 134(H) 97 125(H)  BUN 6 - 20 mg/dL 16 23(H) 19  Creatinine 0.61 - 1.24 mg/dL 1.07 1.21 1.01  Sodium 135 - 145 mmol/L 135 136 139  Potassium 3.5 - 5.1 mmol/L 4.6 4.9 4.5  Chloride 101 - 111 mmol/L 98(L) 100(L) 104  CO2 22 - 32 mmol/L 25 29 25   Calcium 8.9 - 10.3 mg/dL 9.5 9.4 9.5  Total Protein 6.5 - 8.1 g/dL 7.3 7.2 -  Total Bilirubin 0.3 - 1.2 mg/dL 0.8 0.9 -  Alkaline Phos 38 - 126 U/L 71 80 -  AST 15 - 41 U/L 23 17 -  ALT 17 - 63 U/L 26 18 -          PENDING LABS:    DIAGNOSTIC IMAGING:  US abdomen: 01/20/17 CLINICAL DATA:  Polycythemia vera.  EXAM: ABDOMEN ULTRASOUND COMPLETE  COMPARISON:  Ultrasound of Sep 10, 2008.  FINDINGS: Gallbladder: No gallstones or wall thickening visualized. No sonographic Murphy sign noted by sonographer.  Common bile duct: Diameter: 3.8 mm which is within normal limits.  Liver: No focal lesion identified. Increased echogenicity of hepatic parenchyma is noted suggesting fatty infiltration or other diffuse hepatocellular disease. Portal vein is patent on color  Doppler imaging with normal direction of blood flow towards the liver.  IVC: Not visualized due to overlying bowel gas.  Pancreas: Not visualized due to overlying bowel gas.  Spleen: Maximum measured diameter of 4.5 cm with calculated volume 449 cubic cm consistent with mild splenomegaly.  Right Kidney: Length: 10.9 cm. Echogenicity within normal limits. No mass or hydronephrosis visualized.  Left Kidney: Length: 10.3 cm. Echogenicity within normal limits. No mass or hydronephrosis visualized.  Abdominal aorta: No aneurysm visualized.  Other findings: None.  IMPRESSION: IVC and pancreas not visualized due to overlying bowel gas.  Increased echogenicity of hepatic parenchyma is noted suggesting fatty infiltration or other diffuse hepatocellular disease.  Mild splenomegaly is noted.   Electronically Signed   By: Marijo Conception, M.D.   On: 01/20/2017 15:10    PATHOLOGY:      ASSESSMENT & PLAN:   Secondary polycythemia:  -Thought to be secondary to sleep apnea and prior testosterone replacement (testosterone replacement since stopped). JAK2/MPL/CALR evaluation negative.  BCR-ABL was checked and came back positive for b2a2 transcript of the BCR-ABL fusion gene at 0.0624%. BCR positivity is very specific for diagnosis of CML. He underwent bone marrow biopsy in 02/2017 that showed no diagnostic or definitive morphologic evidence of myeloproliferative or myelodysplastic neoplasm.  Chart reviewed; based on previous documentation, Dr. Talbert Cage ([facility] medical oncologist) reviewed pt's case with Dr. Gari Crown with pathology. Per notes, Dr. Gari Crown did not believe patient had CML despite positive BCR-ABL.  -Lab trend reviewed.    -Last Hct 57% on 06/01/17; arrangements made for 2 therapeutic phlebotomies. He has had 1 thus far and tolerated it well. Clinically, he feels better in terms of his energy levels.  He is scheduled for 2nd phlebotomy later this week.  Will plan to recheck  CBC with diff ~1 week after 2nd phlebotomy to ensure adequate response.   -Goal Hct < 50-54%.  -Continue aspirin 325 mg daily.  -Return to cancer center in 3 months for follow-up with labs. Gave him strict instructions that if he has any chest pain, shortness of breath, or other symptoms  concerning for stroke/heart attack or other emergent complaints, that he should seek care from closest ER.  Of course, if he has other concerns he is welcome to call us and we can make arrangements for labs and/or office visit sooner if needed. He agrees with this plan.    Sleep apnea:  -Reviewed with him that the ultimate goal to treat secondary polycythemia includes treating the underlying cause.  Recommended he follow-up with his PCP, as it has been quite some time since his last sleep study. The goal is that better control of his sleep apnea would improve both his quality of life/symptom burden, but also his blood counts.  He agrees to talk with his PCP about making arrangements for additional sleep study evaluation.        Dispo:  -Proceed with 2nd therapeutic phlebotomy on 06/10/17.  -Labs only ~1 week after 2nd phlebotomy to ensure adequate response. (CBC with diff only)  -Return to cancer center in 3 months for follow-up with labs.  (CBC with diff, BMP)    All questions were answered to patient's stated satisfaction. Encouraged patient to call with any new concerns or questions before his next visit to the cancer center and we can certain see him sooner, if needed.      Orders placed this encounter:  Orders Placed This Encounter  Procedures  . CBC with Differential/Platelet  . Basic metabolic panel  . CBC with Differential/Platelet      Mike Craze, NP [place] [phone]

## 2017-06-09 ENCOUNTER — Other Ambulatory Visit (HOSPITAL_COMMUNITY): Payer: Self-pay

## 2017-06-09 ENCOUNTER — Ambulatory Visit (HOSPITAL_COMMUNITY): Payer: PPO

## 2017-06-09 DIAGNOSIS — D45 Polycythemia vera: Secondary | ICD-10-CM

## 2017-06-10 ENCOUNTER — Encounter (HOSPITAL_COMMUNITY)
Admission: RE | Admit: 2017-06-10 | Discharge: 2017-06-10 | Disposition: A | Discharge status: Home / Self Care | Payer: PPO | Source: Ambulatory Visit | Attending: Internal Medicine | Admitting: Internal Medicine

## 2017-06-10 DIAGNOSIS — D45 Polycythemia vera: Secondary | ICD-10-CM | POA: Diagnosis not present

## 2017-06-10 NOTE — Progress Notes (Addendum)
Brandon Maddox presents today for phlebotomy per MD orders. HGB/HCT 19.4/57 Phlebotomy procedure started at 1050 and ended at 1058 350 cc removed. V needle removed intact.  Pressure dressing applied.Patient tolerated procedure well. BP low post phlebotomy, see documentation.  Fluids pushed and patient kept for 30 minutes until BP normalized.  Denied any symptoms.

## 2017-06-17 ENCOUNTER — Inpatient Hospital Stay (HOSPITAL_COMMUNITY): Payer: PPO | Attending: Internal Medicine

## 2017-06-17 DIAGNOSIS — D45 Polycythemia vera: Secondary | ICD-10-CM | POA: Diagnosis not present

## 2017-06-17 DIAGNOSIS — D751 Secondary polycythemia: Secondary | ICD-10-CM

## 2017-06-17 LAB — CBC WITH DIFFERENTIAL/PLATELET
Basophils Absolute: 0 10*3/uL (ref 0.0–0.1)
Basophils Relative: 0 %
Eosinophils Absolute: 0.1 10*3/uL (ref 0.0–0.7)
Eosinophils Relative: 1 %
HCT: 52.5 % — ABNORMAL HIGH (ref 39.0–52.0)
HEMOGLOBIN: 17.7 g/dL — AB (ref 13.0–17.0)
LYMPHS ABS: 1.9 10*3/uL (ref 0.7–4.0)
LYMPHS PCT: 34 %
MCH: 30.6 pg (ref 26.0–34.0)
MCHC: 33.7 g/dL (ref 30.0–36.0)
MCV: 90.8 fL (ref 78.0–100.0)
Monocytes Absolute: 0.4 10*3/uL (ref 0.1–1.0)
Monocytes Relative: 7 %
Neutro Abs: 3.3 10*3/uL (ref 1.7–7.7)
Neutrophils Relative %: 58 %
Platelets: 113 10*3/uL — ABNORMAL LOW (ref 150–400)
RBC: 5.78 MIL/uL (ref 4.22–5.81)
RDW: 13.9 % (ref 11.5–15.5)
WBC: 5.7 10*3/uL (ref 4.0–10.5)

## 2017-06-29 NOTE — Progress Notes (Signed)
Cardiology Office Note  Date: 06/30/2017   ID: Brandon Maddox, DOB [birth year], MRN [id number]  PCP: [name], MD  Primary Cardiologist: Rozann Lesches, MD   Chief Complaint  Patient presents with  . Atrial Fibrillation    History of Present Illness: Brandon Maddox is a 67 y.o. male last seen December 2017.  He presents for a routine follow-up visit.  Reports stable generalized fatigue and dyspnea on exertion.  No exertional chest pain or palpitations.  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  CHADSVASC score is 1 at this point.  Annual risk of stroke on aspirin is approximately 1.2%.  We discussed continuing aspirin for now.  He is on Toprol-XL 25 mg daily for heart rate control.  I personally reviewed his ECG today which shows rate controlled atrial fibrillation with nonspecific T wave changes.  Past Medical History:  Diagnosis Date  . Closed right scapular fracture   . Depression   . GERD (gastroesophageal reflux disease)   . Gout   . History of alcohol abuse   . Low testosterone   . 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 Ribs, multiple fractures     Past Surgical History:  Procedure Laterality Date  . HIP SURGERY  2010   Dr. Mayer Camel  . LACERATION REPAIR  05/03/2012   Procedure: REPAIR MULTIPLE LACERATIONS;  Surgeon: Jerrell Belfast, MD;  Location: [facility];  Service: ENT;  Laterality: N/A;    Current Outpatient Medications  Medication Sig Dispense Refill  . allopurinol (ZYLOPRIM) 300 MG tablet     . aspirin EC 325 MG tablet Take 1 tablet (325 mg total) by mouth daily. 30 tablet 0  . buPROPion (WELLBUTRIN XL) 150 MG 24 hr tablet 450 mg.   4  . metoprolol succinate (TOPROL XL) 25 MG 24 hr tablet Take 1 tablet (25 mg total) by mouth daily. 90 tablet 3  . Multiple Vitamin (MULTIVITAMIN WITH MINERALS) TABS tablet Take 1 tablet by mouth daily. 30 tablet 0  . omeprazole (PRILOSEC OTC) 20 MG tablet 20  mg delayed release tablet; oral every day; Dispense: 30    . pseudoephedrine-guaifenesin (MUCINEX D) 60-600 MG 12 hr tablet Take 1 tablet by mouth every 12 (twelve) hours.    . Tamsulosin HCl (FLOMAX) 0.4 MG CAPS Take 0.4 mg by mouth daily after supper.      No current facility-administered medications for this visit.    Allergies:  Penicillins   Social History: The patient  reports that he quit smoking about 14 years ago. His smoking use included cigarettes. he has never used smokeless tobacco. He reports that he does not drink alcohol or use drugs.   ROS:  Please see the history of present illness. Otherwise, complete review of systems is positive for none.  All other systems are reviewed and negative.   Physical Exam: VS:  BP 110/70   Pulse 85   Ht 5\' 11"  (1.803 m)   Wt 222 lb (100.7 kg)   SpO2 98%   BMI 30.96 kg/m , BMI Body mass index is 30.96 kg/m.  Wt Readings from Last 3 Encounters:  06/30/17 222 lb (100.7 kg)  06/08/17 221 lb 3.2 oz (100.3 kg)  03/08/17 213 lb 9.6 oz (96.9 kg)    General: Overweight male, appears comfortable at rest. HEENT: Conjunctiva and lids normal, oropharynx clear. Neck: Supple, no elevated JVP or carotid bruits, no thyromegaly. Lungs:  Clear to auscultation, nonlabored breathing at rest. Cardiac: Irregularly irregular, no S3 or significant systolic murmur, no pericardial rub. Abdomen: Soft, nontender, bowel sounds present. Extremities: No pitting edema, distal pulses 2+.  ECG: I personally reviewed the tracing from 08/06/2015 which showed atrial fibrillation.  Recent Labwork: 06/01/2017: ALT 26; AST 23; BUN 16; Creatinine, Ser 1.07; Potassium 4.6; Sodium 135 06/17/2017: Hemoglobin 17.7; Platelets 113   Other Studies Reviewed Today:  Echocardiogram 12/16/2015: Study Conclusions  - Left ventricle: The cavity size was normal. Wall thickness was increased in a pattern of mild LVH. Systolic function was normal. The estimated ejection fraction  was in the range of 55% to 60%. Wall motion was normal; there were no regional wall motion abnormalities. The study is not technically sufficient to allow evaluation of LV diastolic function.  Assessment and Plan:  Chronic atrial fibrillation with low thromboembolic risk score, CHADSVASC of 1.  He remains on aspirin and Toprol-XL.  We will continue with current plan and observation for now.  Current medicines were reviewed with the patient today.   Orders Placed This Encounter  Procedures  . EKG 12-Lead    Disposition: Follow-up in 1 year, sooner if needed.  Signed, Satira Sark, MD, [facility] 06/30/2017 1:59 PM    [place] at [facility] [address], [place], [place] [address] Phone: [phone]; Fax: [phone]

## 2017-06-30 ENCOUNTER — Ambulatory Visit: Payer: PPO | Admitting: Cardiology

## 2017-06-30 ENCOUNTER — Encounter: Payer: Self-pay | Admitting: Cardiology

## 2017-06-30 VITALS — BP 110/70 | HR 85 | Ht 71.0 in | Wt 222.0 lb

## 2017-06-30 DIAGNOSIS — I481 Persistent atrial fibrillation: Secondary | ICD-10-CM | POA: Diagnosis not present

## 2017-06-30 DIAGNOSIS — I4819 Other persistent atrial fibrillation: Secondary | ICD-10-CM

## 2017-06-30 NOTE — Patient Instructions (Addendum)
Your physician wants you to follow-up in: 1 year with Dr.McDowell You will receive a reminder letter in the mail two months in advance. If you don't receive a letter, please call our office to schedule the follow-up appointment.    Your physician recommends that you continue on your current medications as directed. Please refer to the Current Medication list given to you today.   Remain on Aspirin 325 mg daily    No lab work or tests ordered today.     Thank you for choosing [facility] !

## 2017-07-01 ENCOUNTER — Other Ambulatory Visit (HOSPITAL_COMMUNITY): Payer: PPO

## 2017-07-05 DIAGNOSIS — Z6832 Body mass index (BMI) 32.0-32.9, adult: Secondary | ICD-10-CM | POA: Diagnosis not present

## 2017-07-05 DIAGNOSIS — D751 Secondary polycythemia: Secondary | ICD-10-CM | POA: Diagnosis not present

## 2017-07-05 DIAGNOSIS — Z23 Encounter for immunization: Secondary | ICD-10-CM | POA: Diagnosis not present

## 2017-07-05 DIAGNOSIS — I482 Chronic atrial fibrillation: Secondary | ICD-10-CM | POA: Diagnosis not present

## 2017-07-18 ENCOUNTER — Other Ambulatory Visit (HOSPITAL_COMMUNITY): Payer: PPO

## 2017-07-19 ENCOUNTER — Inpatient Hospital Stay (HOSPITAL_COMMUNITY): Payer: PPO | Attending: Adult Health

## 2017-07-19 DIAGNOSIS — D45 Polycythemia vera: Secondary | ICD-10-CM

## 2017-07-19 LAB — CBC WITH DIFFERENTIAL/PLATELET
BASOS PCT: 0 %
Basophils Absolute: 0 10*3/uL (ref 0.0–0.1)
EOS ABS: 0.1 10*3/uL (ref 0.0–0.7)
EOS PCT: 1 %
HCT: 54.5 % — ABNORMAL HIGH (ref 39.0–52.0)
HEMOGLOBIN: 18.6 g/dL — AB (ref 13.0–17.0)
LYMPHS ABS: 2.4 10*3/uL (ref 0.7–4.0)
Lymphocytes Relative: 35 %
MCH: 30.8 pg (ref 26.0–34.0)
MCHC: 34.1 g/dL (ref 30.0–36.0)
MCV: 90.4 fL (ref 78.0–100.0)
MONOS PCT: 0 %
Monocytes Absolute: 0 10*3/uL — ABNORMAL LOW (ref 0.1–1.0)
NEUTROS PCT: 64 %
Neutro Abs: 4.4 10*3/uL (ref 1.7–7.7)
PLATELETS: 131 10*3/uL — AB (ref 150–400)
RBC: 6.03 MIL/uL — ABNORMAL HIGH (ref 4.22–5.81)
RDW: 13.7 % (ref 11.5–15.5)
WBC: 7 10*3/uL (ref 4.0–10.5)

## 2017-09-01 ENCOUNTER — Inpatient Hospital Stay (HOSPITAL_COMMUNITY): Payer: PPO | Attending: Hematology

## 2017-09-01 DIAGNOSIS — E291 Testicular hypofunction: Secondary | ICD-10-CM | POA: Insufficient documentation

## 2017-09-01 DIAGNOSIS — Z7982 Long term (current) use of aspirin: Secondary | ICD-10-CM | POA: Insufficient documentation

## 2017-09-01 DIAGNOSIS — R5383 Other fatigue: Secondary | ICD-10-CM | POA: Diagnosis not present

## 2017-09-01 DIAGNOSIS — I4891 Unspecified atrial fibrillation: Secondary | ICD-10-CM | POA: Diagnosis not present

## 2017-09-01 DIAGNOSIS — F329 Major depressive disorder, single episode, unspecified: Secondary | ICD-10-CM | POA: Insufficient documentation

## 2017-09-01 DIAGNOSIS — D751 Secondary polycythemia: Secondary | ICD-10-CM

## 2017-09-01 DIAGNOSIS — Z8781 Personal history of (healed) traumatic fracture: Secondary | ICD-10-CM | POA: Diagnosis not present

## 2017-09-01 DIAGNOSIS — Z79899 Other long term (current) drug therapy: Secondary | ICD-10-CM | POA: Insufficient documentation

## 2017-09-01 DIAGNOSIS — Z87891 Personal history of nicotine dependence: Secondary | ICD-10-CM | POA: Insufficient documentation

## 2017-09-01 DIAGNOSIS — M109 Gout, unspecified: Secondary | ICD-10-CM | POA: Insufficient documentation

## 2017-09-01 DIAGNOSIS — K219 Gastro-esophageal reflux disease without esophagitis: Secondary | ICD-10-CM | POA: Diagnosis not present

## 2017-09-01 DIAGNOSIS — G4730 Sleep apnea, unspecified: Secondary | ICD-10-CM | POA: Diagnosis not present

## 2017-09-01 LAB — CBC WITH DIFFERENTIAL/PLATELET
BASOS ABS: 0 10*3/uL (ref 0.0–0.1)
BASOS PCT: 0 %
EOS ABS: 0.1 10*3/uL (ref 0.0–0.7)
Eosinophils Relative: 2 %
HCT: 55.4 % — ABNORMAL HIGH (ref 39.0–52.0)
HEMOGLOBIN: 19.1 g/dL — AB (ref 13.0–17.0)
LYMPHS ABS: 1.8 10*3/uL (ref 0.7–4.0)
Lymphocytes Relative: 32 %
MCH: 30.9 pg (ref 26.0–34.0)
MCHC: 34.5 g/dL (ref 30.0–36.0)
MCV: 89.5 fL (ref 78.0–100.0)
MONOS PCT: 5 %
Monocytes Absolute: 0.3 10*3/uL (ref 0.1–1.0)
NEUTROS ABS: 3.4 10*3/uL (ref 1.7–7.7)
NEUTROS PCT: 61 %
Platelets: 130 10*3/uL — ABNORMAL LOW (ref 150–400)
RBC: 6.19 MIL/uL — ABNORMAL HIGH (ref 4.22–5.81)
RDW: 14 % (ref 11.5–15.5)
WBC: 5.6 10*3/uL (ref 4.0–10.5)

## 2017-09-01 LAB — BASIC METABOLIC PANEL WITH GFR
Anion gap: 7 (ref 5–15)
BUN: 23 mg/dL — ABNORMAL HIGH (ref 6–20)
CO2: 27 mmol/L (ref 22–32)
Calcium: 9.2 mg/dL (ref 8.9–10.3)
Chloride: 101 mmol/L (ref 101–111)
Creatinine, Ser: 1.1 mg/dL (ref 0.61–1.24)
GFR calc Af Amer: >60 mL/min
GFR calc non Af Amer: >60 mL/min
Glucose, Bld: 112 mg/dL — ABNORMAL HIGH (ref 65–99)
Potassium: 4.2 mmol/L (ref 3.5–5.1)
Sodium: 135 mmol/L (ref 135–145)

## 2017-09-05 ENCOUNTER — Inpatient Hospital Stay (HOSPITAL_BASED_OUTPATIENT_CLINIC_OR_DEPARTMENT_OTHER): Payer: PPO | Admitting: Hematology

## 2017-09-05 ENCOUNTER — Encounter (HOSPITAL_COMMUNITY): Payer: Self-pay | Admitting: Hematology

## 2017-09-05 ENCOUNTER — Other Ambulatory Visit: Payer: Self-pay

## 2017-09-05 DIAGNOSIS — R5383 Other fatigue: Secondary | ICD-10-CM

## 2017-09-05 DIAGNOSIS — D751 Secondary polycythemia: Secondary | ICD-10-CM | POA: Diagnosis not present

## 2017-09-05 DIAGNOSIS — Z87891 Personal history of nicotine dependence: Secondary | ICD-10-CM

## 2017-09-05 DIAGNOSIS — I4891 Unspecified atrial fibrillation: Secondary | ICD-10-CM

## 2017-09-05 DIAGNOSIS — Z8781 Personal history of (healed) traumatic fracture: Secondary | ICD-10-CM

## 2017-09-05 DIAGNOSIS — K219 Gastro-esophageal reflux disease without esophagitis: Secondary | ICD-10-CM

## 2017-09-05 DIAGNOSIS — E291 Testicular hypofunction: Secondary | ICD-10-CM | POA: Diagnosis not present

## 2017-09-05 DIAGNOSIS — F329 Major depressive disorder, single episode, unspecified: Secondary | ICD-10-CM | POA: Diagnosis not present

## 2017-09-05 DIAGNOSIS — M109 Gout, unspecified: Secondary | ICD-10-CM

## 2017-09-05 DIAGNOSIS — Z79899 Other long term (current) drug therapy: Secondary | ICD-10-CM | POA: Diagnosis not present

## 2017-09-05 DIAGNOSIS — G4730 Sleep apnea, unspecified: Secondary | ICD-10-CM

## 2017-09-05 DIAGNOSIS — Z7982 Long term (current) use of aspirin: Secondary | ICD-10-CM | POA: Diagnosis not present

## 2017-09-05 DIAGNOSIS — D45 Polycythemia vera: Secondary | ICD-10-CM

## 2017-09-05 NOTE — Progress Notes (Signed)
[place], [place] [address]   CLINIC:  Medical Oncology/Hematology  PCP:  [name], [place] [address] [phone]   REASON FOR VISIT:  Follow-up for polycythemia.  CURRENT THERAPY: Intermittent phlebotomies.   INTERVAL HISTORY:  Brandon Maddox 67 y.o. 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  He did undergo phlebotomy x2 in February.  When his blood goes up, he feels tired.  Tiredness is improved after phlebotomy.  Denies any fevers, night sweats or weight loss.  Denies any excessive daytime sleepiness.  He does not take any naps during daytime.  He is not using CPAP for sleep apnea.  He came off of testosterone supplements in July 2018.  Denies any infections or recent hospitalizations.  REVIEW OF SYSTEMS:  Review of Systems  Constitutional: Positive for fatigue.  Hematological: Bruises/bleeds easily.  All other systems reviewed and are negative.    PAST MEDICAL/SURGICAL HISTORY:  Past Medical History:  Diagnosis Date  . Closed right scapular fracture   . Depression   . GERD (gastroesophageal reflux disease)   . Gout   . History of alcohol abuse   . Low testosterone   . 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 Ribs, multiple fractures    Past Surgical History:  Procedure Laterality Date  . HIP SURGERY  2010   Dr. Mayer Camel  . LACERATION REPAIR  05/03/2012   Procedure: REPAIR MULTIPLE LACERATIONS;  Surgeon: Jerrell Belfast, MD;  Location: [facility] OR;  Service: ENT;  Laterality: N/A;     SOCIAL HISTORY:  Social History   Socioeconomic History  . Marital status: Divorced    Spouse name: Not on file  . Number of children: 1  . Years of education: Not on file  . Highest education level: Not on file  Occupational History    Employer: SELF EMPLOYED  Social Needs  . Financial resource strain: Not on file  . Food insecurity:    Worry: Not on file    Inability: Not on file  .  Transportation needs:    Medical: Not on file    Non-medical: Not on file  Tobacco Use  . Smoking status: Former Smoker    Types: Cigarettes    Last attempt to quit: 04/20/2003    Years since quitting: 14.3  . Smokeless tobacco: Never Used  Substance and Sexual Activity  . Alcohol use: No    Alcohol/week: 0.0 oz    Frequency: Never    Comment: History of alcohol abuse in the past  . Drug use: No  . Sexual activity: Yes  Lifestyle  . Physical activity:    Days per week: Not on file    Minutes per session: Not on file  . Stress: Not on file  Relationships  . Social connections:    Talks on phone: Not on file    Gets together: Not on file    Attends religious service: Not on file    Active member of club or organization: Not on file    Attends meetings of clubs or organizations: Not on file    Relationship status: Not on file  . Intimate partner violence:    Fear of current or ex partner: Not on file    Emotionally abused: Not on file    Physically abused: Not on file    Forced sexual activity: Not on file  Other Topics Concern  . Not on file  Social History Narrative  . Not  on file    FAMILY HISTORY:  Family History  Problem Relation Age of Onset  . Diabetes Mother   . Diabetes Father     CURRENT MEDICATIONS:  Outpatient Encounter Medications as of 09/05/2017  Medication Sig Note  . allopurinol (ZYLOPRIM) 300 MG tablet  08/27/2015: Received from: [facility]  . aspirin 81 MG chewable tablet Chew 81 mg by mouth daily.   Marland Kitchen buPROPion (WELLBUTRIN XL) 150 MG 24 hr tablet 450 mg.  08/27/2015: Received from: External Pharmacy  . metoprolol succinate (TOPROL XL) 25 MG 24 hr tablet Take 1 tablet (25 mg total) by mouth daily.   Marland Kitchen omeprazole (PRILOSEC OTC) 20 MG tablet 20 mg delayed release tablet; oral every day; Dispense: 30   . Tamsulosin HCl (FLOMAX) 0.4 MG CAPS Take 0.4 mg by mouth daily after supper.    . [DISCONTINUED] aspirin EC 325 MG tablet Take 1  tablet (325 mg total) by mouth daily. (Patient taking differently: Take 81 mg by mouth daily. )   . [DISCONTINUED] Multiple Vitamin (MULTIVITAMIN WITH MINERALS) TABS tablet Take 1 tablet by mouth daily. 02/17/2017: Patient not taking  . [DISCONTINUED] pseudoephedrine-guaifenesin (MUCINEX D) 60-600 MG 12 hr tablet Take 1 tablet by mouth every 12 (twelve) hours.    No facility-administered encounter medications on file as of 09/05/2017.     ALLERGIES:  Allergies  Allergen Reactions  . Penicillins     Mother told him throat swelled up when he took at a young age      PHYSICAL EXAM:  ECOG Performance status: 1  Vitals:   09/05/17 1506  BP: 126/78  Pulse: 88  Resp: 18  Temp: 98.6 F (37 C)  SpO2: 97%   Filed Weights   09/05/17 1506  Weight: 221 lb 9.6 oz (100.5 kg)    Physical Exam Focused physical examination did not reveal any palpable hepatosplenomegaly.  No palpable lymphadenopathy.  LABORATORY DATA:  I have reviewed the labs as listed.  CBC    Component Value Date/Time   WBC 5.6 09/01/2017 0904   RBC 6.19 (H) 09/01/2017 0904   HGB 19.1 (H) 09/01/2017 0904   HCT 55.4 (H) 09/01/2017 0904   PLT 130 (L) 09/01/2017 0904   MCV 89.5 09/01/2017 0904   MCH 30.9 09/01/2017 0904   MCHC 34.5 09/01/2017 0904   RDW 14.0 09/01/2017 0904   LYMPHSABS 1.8 09/01/2017 0904   MONOABS 0.3 09/01/2017 0904   EOSABS 0.1 09/01/2017 0904   BASOSABS 0.0 09/01/2017 0904   CMP Latest Ref Rng & Units 09/01/2017 06/01/2017 03/08/2017  Glucose 65 - 99 mg/dL 112(H) 134(H) 97  BUN 6 - 20 mg/dL 23(H) 16 23(H)  Creatinine 0.61 - 1.24 mg/dL 1.10 1.07 1.21  Sodium 135 - 145 mmol/L 135 135 136  Potassium 3.5 - 5.1 mmol/L 4.2 4.6 4.9  Chloride 101 - 111 mmol/L 101 98(L) 100(L)  CO2 22 - 32 mmol/L 27 25 29   Calcium 8.9 - 10.3 mg/dL 9.2 9.5 9.4  Total Protein 6.5 - 8.1 g/dL - 7.3 7.2  Total Bilirubin 0.3 - 1.2 mg/dL - 0.8 0.9  Alkaline Phos 38 - 126 U/L - 71 80  AST 15 - 41 U/L - 23 17  ALT 17 -  63 U/L - 26 18        ASSESSMENT & PLAN:   Polycythemia vera (HCC) 1.  Secondary polycythemia: - Jak 2 V6 89F and reflex testing negative.  Initially thought to be secondary to testosterone supplements.  He has  been off of testosterone since July 2018.  However he continues to have secondary polycythemia. - He has obstructive sleep apnea which was diagnosed, but did not follow-up to get a CPAP machine.  He does not have any signs or symptoms of excessive sleepiness during daytime. -His hematocrit today is 55.4.  I have recommended one phlebotomy today.  We will recheck his CBC in 2 months.  I will see him back in 4 months for follow-up. - BCR/ABL by PCR showed trace positivity for B2 A2 transcript.  I have recommended doing a fish testing. - Bone marrow biopsy in November 2018 showed slightly hypercellular bone marrow for age with trilineage hematopoiesis.  No definitive morphological evidence of myeloproliferative disorder.  2.  Atrial fibrillation: -Rate controlled with beta-blocker.  Not on any anticoagulation.      Orders placed this encounter:  No orders of the defined types were placed in this encounter.     Derek Jack, MD [place] [phone]

## 2017-09-05 NOTE — Assessment & Plan Note (Signed)
1.  Secondary polycythemia: - Jak 2 V6 38F and reflex testing negative.  Initially thought to be secondary to testosterone supplements.  He has been off of testosterone since July 2018.  However he continues to have secondary polycythemia. - He has obstructive sleep apnea which was diagnosed, but did not follow-up to get a CPAP machine.  He does not have any signs or symptoms of excessive sleepiness during daytime. -His hematocrit today is 55.4.  I have recommended one phlebotomy today.  We will recheck his CBC in 2 months.  I will see him back in 4 months for follow-up. - BCR/ABL by PCR showed trace positivity for B2 A2 transcript.  I have recommended doing a fish testing. - Bone marrow biopsy in November 2018 showed slightly hypercellular bone marrow for age with trilineage hematopoiesis.  No definitive morphological evidence of myeloproliferative disorder.  2.  Atrial fibrillation: -Rate controlled with beta-blocker.  Not on any anticoagulation.

## 2017-09-07 ENCOUNTER — Encounter (HOSPITAL_COMMUNITY)
Admission: RE | Admit: 2017-09-07 | Discharge: 2017-09-07 | Disposition: A | Discharge status: Home / Self Care | Payer: PPO | Source: Ambulatory Visit | Attending: Internal Medicine | Admitting: Internal Medicine

## 2017-09-07 ENCOUNTER — Encounter (HOSPITAL_COMMUNITY): Payer: Self-pay

## 2017-09-07 DIAGNOSIS — D45 Polycythemia vera: Secondary | ICD-10-CM | POA: Diagnosis not present

## 2017-09-07 NOTE — Progress Notes (Signed)
Brandon Maddox presents today for phlebotomy per MD orders. HGB/HCT:19.1/55.4 Phlebotomy procedure started at 1200 and ended at 1210. 21 oz removed. Patient tolerated procedure fair. IV needle removed intact. Drg to site. 1212-States "I don't feel well." Alert/oriented. Color pink. Skin cool/clammy. Feet elevated and HOB lowered. O2 started with NRB mask at 10 l/m. Resp adequate/nonlabored. Cool cloth applied to forehead. Vital signs obtained. Charted in flowsheets. Pt states that he was not a diabetic. Also stated that he had not eaten anything today. Drank a cup of coffee and drank 2 glasses of water. 1214-BP 58/32. #20 IV started left forearm with LR infusing without difficulty.  1221-States that he was "feeling better". Alert. Graham crackers and ginger-ale provided. Tolerated well. Vital signs continue to be monitored. 1227-Attempted to contact nurses line in [facility] [phone]) no answer. Unable to message. Voice message left on Amy's line to return call. 1259-Amy returned call. Transferred call to [place]. Michelle notified of pt status and events. Stated that she would notify Dr Delton Coombes. No further instructions given. Cleared for d/c to home. 1303-Vital signs stable. Awake. Oriented. Voices no c/o at this time. Skin warm and dry. IV d/c'd. Drsg to site. 600 ml of LR infused. Tolerated well. D/C to home in good condition. Instructed pt to rest this pm and drink plenty of fluids. Instructed him to eat before coming for next phlebotomy. Voiced understanding.

## 2017-10-31 DIAGNOSIS — Z125 Encounter for screening for malignant neoplasm of prostate: Secondary | ICD-10-CM | POA: Diagnosis not present

## 2017-10-31 DIAGNOSIS — D751 Secondary polycythemia: Secondary | ICD-10-CM | POA: Diagnosis not present

## 2017-10-31 DIAGNOSIS — F1020 Alcohol dependence, uncomplicated: Secondary | ICD-10-CM | POA: Diagnosis not present

## 2017-10-31 DIAGNOSIS — Z79899 Other long term (current) drug therapy: Secondary | ICD-10-CM | POA: Diagnosis not present

## 2017-10-31 DIAGNOSIS — I482 Chronic atrial fibrillation: Secondary | ICD-10-CM | POA: Diagnosis not present

## 2017-10-31 DIAGNOSIS — M109 Gout, unspecified: Secondary | ICD-10-CM | POA: Diagnosis not present

## 2017-11-02 ENCOUNTER — Other Ambulatory Visit (HOSPITAL_COMMUNITY): Payer: Self-pay

## 2017-11-02 DIAGNOSIS — D45 Polycythemia vera: Secondary | ICD-10-CM

## 2017-11-03 ENCOUNTER — Inpatient Hospital Stay (HOSPITAL_COMMUNITY): Payer: PPO | Attending: Hematology

## 2017-11-03 DIAGNOSIS — I4891 Unspecified atrial fibrillation: Secondary | ICD-10-CM | POA: Diagnosis not present

## 2017-11-03 DIAGNOSIS — D45 Polycythemia vera: Secondary | ICD-10-CM | POA: Insufficient documentation

## 2017-11-03 LAB — CBC WITH DIFFERENTIAL/PLATELET
BASOS ABS: 0 10*3/uL (ref 0.0–0.1)
BASOS PCT: 0 %
Eosinophils Absolute: 0.1 10*3/uL (ref 0.0–0.7)
Eosinophils Relative: 1 %
HEMATOCRIT: 52.6 % — AB (ref 39.0–52.0)
Hemoglobin: 18.3 g/dL — ABNORMAL HIGH (ref 13.0–17.0)
Lymphocytes Relative: 35 %
Lymphs Abs: 2.3 10*3/uL (ref 0.7–4.0)
MCH: 31.1 pg (ref 26.0–34.0)
MCHC: 34.8 g/dL (ref 30.0–36.0)
MCV: 89.3 fL (ref 78.0–100.0)
MONO ABS: 0.4 10*3/uL (ref 0.1–1.0)
Monocytes Relative: 6 %
NEUTROS ABS: 3.8 10*3/uL (ref 1.7–7.7)
NEUTROS PCT: 58 %
Platelets: 151 10*3/uL (ref 150–400)
RBC: 5.89 MIL/uL — AB (ref 4.22–5.81)
RDW: 13.9 % (ref 11.5–15.5)
WBC: 6.6 10*3/uL (ref 4.0–10.5)

## 2017-11-07 DIAGNOSIS — E119 Type 2 diabetes mellitus without complications: Secondary | ICD-10-CM | POA: Diagnosis not present

## 2017-11-07 DIAGNOSIS — M109 Gout, unspecified: Secondary | ICD-10-CM | POA: Diagnosis not present

## 2017-11-07 DIAGNOSIS — F1021 Alcohol dependence, in remission: Secondary | ICD-10-CM | POA: Diagnosis not present

## 2017-11-08 LAB — BCR-ABL1, CML/ALL, PCR, QUANT

## 2017-11-11 ENCOUNTER — Encounter (HOSPITAL_COMMUNITY): Payer: Self-pay | Admitting: Internal Medicine

## 2017-12-27 ENCOUNTER — Encounter: Payer: Self-pay | Admitting: Internal Medicine

## 2017-12-29 ENCOUNTER — Inpatient Hospital Stay (HOSPITAL_COMMUNITY): Payer: PPO | Attending: Hematology

## 2017-12-29 DIAGNOSIS — R0602 Shortness of breath: Secondary | ICD-10-CM | POA: Diagnosis not present

## 2017-12-29 DIAGNOSIS — Z87891 Personal history of nicotine dependence: Secondary | ICD-10-CM | POA: Insufficient documentation

## 2017-12-29 DIAGNOSIS — G4730 Sleep apnea, unspecified: Secondary | ICD-10-CM | POA: Insufficient documentation

## 2017-12-29 DIAGNOSIS — F329 Major depressive disorder, single episode, unspecified: Secondary | ICD-10-CM | POA: Diagnosis not present

## 2017-12-29 DIAGNOSIS — D751 Secondary polycythemia: Secondary | ICD-10-CM | POA: Insufficient documentation

## 2017-12-29 DIAGNOSIS — K219 Gastro-esophageal reflux disease without esophagitis: Secondary | ICD-10-CM | POA: Insufficient documentation

## 2017-12-29 DIAGNOSIS — Z79899 Other long term (current) drug therapy: Secondary | ICD-10-CM | POA: Insufficient documentation

## 2017-12-29 DIAGNOSIS — D45 Polycythemia vera: Secondary | ICD-10-CM

## 2017-12-29 DIAGNOSIS — R109 Unspecified abdominal pain: Secondary | ICD-10-CM | POA: Insufficient documentation

## 2017-12-29 DIAGNOSIS — Z7982 Long term (current) use of aspirin: Secondary | ICD-10-CM | POA: Insufficient documentation

## 2017-12-29 DIAGNOSIS — I4891 Unspecified atrial fibrillation: Secondary | ICD-10-CM | POA: Diagnosis not present

## 2017-12-29 DIAGNOSIS — M109 Gout, unspecified: Secondary | ICD-10-CM | POA: Insufficient documentation

## 2017-12-29 LAB — CBC WITH DIFFERENTIAL/PLATELET
BASOS ABS: 0 10*3/uL (ref 0.0–0.1)
BASOS PCT: 0 %
EOS ABS: 0.1 10*3/uL (ref 0.0–0.7)
Eosinophils Relative: 1 %
HCT: 54.3 % — ABNORMAL HIGH (ref 39.0–52.0)
Hemoglobin: 18.8 g/dL — ABNORMAL HIGH (ref 13.0–17.0)
Lymphocytes Relative: 34 %
Lymphs Abs: 2.2 10*3/uL (ref 0.7–4.0)
MCH: 31 pg (ref 26.0–34.0)
MCHC: 34.6 g/dL (ref 30.0–36.0)
MCV: 89.6 fL (ref 78.0–100.0)
MONO ABS: 0.4 10*3/uL (ref 0.1–1.0)
MONOS PCT: 6 %
Neutro Abs: 3.9 10*3/uL (ref 1.7–7.7)
Neutrophils Relative %: 59 %
Platelets: 156 10*3/uL (ref 150–400)
RBC: 6.06 MIL/uL — ABNORMAL HIGH (ref 4.22–5.81)
RDW: 13.8 % (ref 11.5–15.5)
WBC: 6.6 10*3/uL (ref 4.0–10.5)

## 2017-12-29 LAB — COMPREHENSIVE METABOLIC PANEL
ALBUMIN: 4.1 g/dL (ref 3.5–5.0)
ALT: 27 U/L (ref 0–44)
AST: 20 U/L (ref 15–41)
Alkaline Phosphatase: 71 U/L (ref 38–126)
Anion gap: 9 (ref 5–15)
BUN: 14 mg/dL (ref 8–23)
CALCIUM: 9.3 mg/dL (ref 8.9–10.3)
CO2: 26 mmol/L (ref 22–32)
CREATININE: 1.04 mg/dL (ref 0.61–1.24)
Chloride: 103 mmol/L (ref 98–111)
Glucose, Bld: 112 mg/dL — ABNORMAL HIGH (ref 70–99)
Potassium: 4.7 mmol/L (ref 3.5–5.1)
SODIUM: 138 mmol/L (ref 135–145)
Total Bilirubin: 1.1 mg/dL (ref 0.3–1.2)
Total Protein: 7.3 g/dL (ref 6.5–8.1)

## 2018-01-05 ENCOUNTER — Inpatient Hospital Stay (HOSPITAL_BASED_OUTPATIENT_CLINIC_OR_DEPARTMENT_OTHER): Payer: PPO | Admitting: Internal Medicine

## 2018-01-05 ENCOUNTER — Ambulatory Visit (HOSPITAL_COMMUNITY): Payer: PPO | Admitting: Internal Medicine

## 2018-01-05 ENCOUNTER — Encounter (HOSPITAL_COMMUNITY): Payer: Self-pay | Admitting: Internal Medicine

## 2018-01-05 ENCOUNTER — Other Ambulatory Visit: Payer: Self-pay

## 2018-01-05 DIAGNOSIS — M109 Gout, unspecified: Secondary | ICD-10-CM | POA: Diagnosis not present

## 2018-01-05 DIAGNOSIS — D751 Secondary polycythemia: Secondary | ICD-10-CM

## 2018-01-05 DIAGNOSIS — I4891 Unspecified atrial fibrillation: Secondary | ICD-10-CM | POA: Diagnosis not present

## 2018-01-05 DIAGNOSIS — Z87891 Personal history of nicotine dependence: Secondary | ICD-10-CM

## 2018-01-05 DIAGNOSIS — Z7982 Long term (current) use of aspirin: Secondary | ICD-10-CM

## 2018-01-05 DIAGNOSIS — R0602 Shortness of breath: Secondary | ICD-10-CM

## 2018-01-05 DIAGNOSIS — K219 Gastro-esophageal reflux disease without esophagitis: Secondary | ICD-10-CM

## 2018-01-05 DIAGNOSIS — R109 Unspecified abdominal pain: Secondary | ICD-10-CM | POA: Diagnosis not present

## 2018-01-05 DIAGNOSIS — F329 Major depressive disorder, single episode, unspecified: Secondary | ICD-10-CM | POA: Diagnosis not present

## 2018-01-05 DIAGNOSIS — G4730 Sleep apnea, unspecified: Secondary | ICD-10-CM | POA: Diagnosis not present

## 2018-01-05 DIAGNOSIS — Z720 Tobacco use: Secondary | ICD-10-CM

## 2018-01-05 DIAGNOSIS — R1084 Generalized abdominal pain: Secondary | ICD-10-CM

## 2018-01-05 DIAGNOSIS — Z79899 Other long term (current) drug therapy: Secondary | ICD-10-CM

## 2018-01-05 NOTE — Progress Notes (Signed)
Diagnosis Shortness of breath - Plan: CT CHEST W CONTRAST  Tobacco use - Plan: CT CHEST W CONTRAST, CBC with Differential/Platelet, Comprehensive metabolic panel, Lactate dehydrogenase, Transferrin Saturation, Polysomnography 4 or more parameters, Ferritin, Iron and TIBC  Generalized abdominal pain - Plan: CT ABDOMEN PELVIS W CONTRAST, CBC with Differential/Platelet, Comprehensive metabolic panel, Lactate dehydrogenase, Transferrin Saturation, Polysomnography 4 or more parameters, Ferritin, Iron and TIBC  Secondary polycythemia - Plan: CT ABDOMEN PELVIS W CONTRAST, CBC with Differential/Platelet, Comprehensive metabolic panel, Lactate dehydrogenase, Transferrin Saturation, Polysomnography 4 or more parameters, Ferritin, Iron and TIBC  Staging Cancer Staging No matching staging information was found for the patient.  Assessment and Plan:  1.  Secondary polycythemia: - Jak 2 V6 43F and reflex testing negative.  Initially thought to be secondary to testosterone supplements.  He has been off of testosterone since July 2018.  However he continues to have secondary polycythemia. - He has obstructive sleep apnea which was diagnosed, but did not follow-up to get a CPAP machine.    Labs done 12/29/2017 reviewed and showed WBC 6.6 HB 18.8 plts 156,000.  Chemistries WNL with K+ 4.7, Cr 1.04 and normal LFTs.    Pt was last phlebotomized in 08/2017.  I discussed with him HCT continues to range from 53-55.  Phlebotomy may not improve numbers long-term if secondary polycythemia.    He had prior history of smoking but has not smoked since 2005.    - BCR/ABL by PCR showed trace positivity for B2 A2 transcript.  PT had repeat testing 11/03/2017 that was negative.   - Bone marrow biopsy in November 2018 showed slightly hypercellular bone marrow for age with trilineage hematopoiesis.  No definitive morphological evidence of myeloproliferative disorder.  Pt will be set up for CT CAP for evaluation of kidneys and   due to persistent polycythemia.  He will be notified of results.  PT is also recommended for sleep study.    2.  Sleep apnea.  This could also explain persistent polycythemia.  He is set up for sleep study.  Will check CT chest.    3.  Atrial fibrillation.  Pt on Beta-blocker.  He is not on anticoagulation.  4.  SOB.  Pulse ox is 98% on room air.  He is a former smoker.  Pt is set up for CT chest.    30 minutes spent with more than 50% spent in counseling and coordination of care.    Current Status:  Pt is seen today for follow-up.  He is here today to go over labs.    Problem List Patient Active Problem List   Diagnosis Date Noted  . Polycythemia vera ([place]) [D45] 01/17/2017  . Alcohol dependence ([place]) [F10.20] 04/03/2013  . Atrial fibrillation ([place]) [I48.91] 04/02/2013  . Atrial fibrillation with RVR ([place]) [I48.91] 04/02/2013  . Laceration of face, complex [S01.91XA] 05/04/2012    Class: Acute  . Ribs, multiple fractures [S22.49XA]     Past Medical History Past Medical History:  Diagnosis Date  . Closed right scapular fracture   . Depression   . GERD (gastroesophageal reflux disease)   . Gout   . History of alcohol abuse   . Low testosterone   . 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 Ribs, multiple fractures     Past Surgical History Past Surgical History:  Procedure Laterality Date  . HIP SURGERY  2010   Dr. Mayer Camel  . LACERATION REPAIR  05/03/2012   Procedure: REPAIR MULTIPLE LACERATIONS;  Surgeon: Jerrell Belfast, MD;  Location: [facility] OR;  Service: ENT;  Laterality: N/A;    Family History Family History  Problem Relation Age of Onset  . Diabetes Mother   . Diabetes Father      Social History  reports that he quit smoking about 14 years ago. His smoking use included cigarettes. He has never used smokeless tobacco. He reports that he does not drink alcohol or use drugs.  Medications  Current Outpatient Medications:  .  allopurinol (ZYLOPRIM) 300 MG tablet, ,  Disp: , Rfl:  .  aspirin 81 MG chewable tablet, Chew 81 mg by mouth daily., Disp: , Rfl:  .  buPROPion (WELLBUTRIN XL) 150 MG 24 hr tablet, 450 mg. , Disp: , Rfl: 4 .  metoprolol succinate (TOPROL XL) 25 MG 24 hr tablet, Take 1 tablet (25 mg total) by mouth daily., Disp: 90 tablet, Rfl: 3 .  omeprazole (PRILOSEC OTC) 20 MG tablet, 20 mg delayed release tablet; oral every day; Dispense: 30, Disp: , Rfl:  .  Tamsulosin HCl (FLOMAX) 0.4 MG CAPS, Take 0.4 mg by mouth daily after supper. , Disp: , Rfl:   Allergies Penicillins  Review of Systems Review of Systems - Oncology ROS negative other than SOB   Physical Exam  Vitals Wt Readings from Last 3 Encounters:  01/05/18 217 lb 9.6 oz (98.7 kg)  09/07/17 221 lb (100.2 kg)  09/05/17 221 lb 9.6 oz (100.5 kg)   Temp Readings from Last 3 Encounters:  01/05/18 97.9 F (36.6 C) (Oral)  09/07/17 98.3 F (36.8 C) (Oral)  09/05/17 98.6 F (37 C) (Oral)   BP Readings from Last 3 Encounters:  01/05/18 98/72  09/07/17 102/78  09/05/17 126/78   Pulse Readings from Last 3 Encounters:  01/05/18 79  09/07/17 80  09/05/17 88   Constitutional: Well-developed, well-nourished, and in no distress.   HENT: Head: Normocephalic and atraumatic.  Mouth/Throat: No oropharyngeal exudate. Mucosa moist. Eyes: Pupils are equal, round, and reactive to light. Conjunctivae are normal. No scleral icterus.  Neck: Normal range of motion. Neck supple. No JVD present.  Cardiovascular: Normal rate, regular rhythm and normal heart sounds.  Exam reveals no gallop and no friction rub.   No murmur heard. Pulmonary/Chest: Effort normal and breath sounds normal. No respiratory distress. No wheezes.No rales.  Abdominal: Soft. Bowel sounds are normal. No distension. There is no tenderness. There is no guarding.  Musculoskeletal: No edema or tenderness.  Lymphadenopathy: No cervical, axillary or supraclavicular adenopathy.  Neurological: Alert and oriented to person,  place, and time. No cranial nerve deficit.  Skin: Skin is warm and dry. No rash noted. No erythema. No pallor.  Psychiatric: Affect and judgment normal.   Labs No visits with results within 3 Day(s) from this visit.  Latest known visit with results is:  Appointment on 12/29/2017  Component Date Value Ref Range Status  . WBC 12/29/2017 6.6  4.0 - 10.5 K/uL Final  . RBC 12/29/2017 6.06* 4.22 - 5.81 MIL/uL Final  . Hemoglobin 12/29/2017 18.8* 13.0 - 17.0 g/dL Final  . HCT 12/29/2017 54.3* 39.0 - 52.0 % Final  . MCV 12/29/2017 89.6  78.0 - 100.0 fL Final  . MCH 12/29/2017 31.0  26.0 - 34.0 pg Final  . MCHC 12/29/2017 34.6  30.0 - 36.0 g/dL Final  . RDW 12/29/2017 13.8  11.5 - 15.5 % Final  . Platelets 12/29/2017 156  150 - 400 K/uL Final  . Neutrophils Relative % 12/29/2017 59  % Final  . Neutro Abs 12/29/2017  3.9  1.7 - 7.7 K/uL Final  . Lymphocytes Relative 12/29/2017 34  % Final  . Lymphs Abs 12/29/2017 2.2  0.7 - 4.0 K/uL Final  . Monocytes Relative 12/29/2017 6  % Final  . Monocytes Absolute 12/29/2017 0.4  0.1 - 1.0 K/uL Final  . Eosinophils Relative 12/29/2017 1  % Final  . Eosinophils Absolute 12/29/2017 0.1  0.0 - 0.7 K/uL Final  . Basophils Relative 12/29/2017 0  % Final  . Basophils Absolute 12/29/2017 0.0  0.0 - 0.1 K/uL Final   Performed at [facility], [address], [place], [place] [address]  . Sodium 12/29/2017 138  135 - 145 mmol/L Final  . Potassium 12/29/2017 4.7  3.5 - 5.1 mmol/L Final  . Chloride 12/29/2017 103  98 - 111 mmol/L Final  . CO2 12/29/2017 26  22 - 32 mmol/L Final  . Glucose, Bld 12/29/2017 112* 70 - 99 mg/dL Final  . BUN 12/29/2017 14  8 - 23 mg/dL Final  . Creatinine, Ser 12/29/2017 1.04  0.61 - 1.24 mg/dL Final  . Calcium 12/29/2017 9.3  8.9 - 10.3 mg/dL Final  . Total Protein 12/29/2017 7.3  6.5 - 8.1 g/dL Final  . Albumin 12/29/2017 4.1  3.5 - 5.0 g/dL Final  . AST 12/29/2017 20  15 - 41 U/L Final  . ALT 12/29/2017 27  0 - 44 U/L Final  .  Alkaline Phosphatase 12/29/2017 71  38 - 126 U/L Final  . Total Bilirubin 12/29/2017 1.1  0.3 - 1.2 mg/dL Final  . GFR calc non Af Amer 12/29/2017 >60  >60 mL/min Final  . GFR calc Af Amer 12/29/2017 >60  >60 mL/min Final   Comment: (NOTE) The eGFR has been calculated using the CKD EPI equation. This calculation has not been validated in all clinical situations. eGFR's persistently <60 mL/min signify possible Chronic Kidney Disease.   Georgiann Hahn gap 12/29/2017 9  5 - 15 Final   Performed at [facility], [address] [place], [place] [address]     Pathology Orders Placed This Encounter  Procedures  . CT CHEST W CONTRAST    Standing Status:   Future    Standing Expiration Date:   01/05/2019    Order Specific Question:   If indicated for the ordered procedure, I authorize the administration of contrast media per Radiology protocol    Answer:   Yes    Order Specific Question:   Preferred imaging location?    Answer:   [facility]    Order Specific Question:   Radiology Contrast Protocol - do NOT remove file path    Answer:   \\[email or web address]  . CT ABDOMEN PELVIS W CONTRAST    Standing Status:   Future    Standing Expiration Date:   01/05/2019    Order Specific Question:   If indicated for the ordered procedure, I authorize the administration of contrast media per Radiology protocol    Answer:   Yes    Order Specific Question:   Preferred imaging location?    Answer:   [facility]    Order Specific Question:   Is Oral Contrast requested for this exam?    Answer:   Yes, Per Radiology protocol    Order Specific Question:   Radiology Contrast Protocol - do NOT remove file path    Answer:   \\[email or web address]  . CBC with Differential/Platelet    Standing Status:   Future    Standing Expiration Date:  01/06/2019  . Comprehensive metabolic panel    Standing Status:   Future    Standing Expiration Date:   01/06/2019  .  Lactate dehydrogenase    Standing Status:   Future    Standing Expiration Date:   01/06/2019  . Transferrin Saturation    Standing Status:   Future    Standing Expiration Date:   01/06/2019  . Ferritin    Standing Status:   Future    Standing Expiration Date:   01/06/2019  . Iron and TIBC    Standing Status:   Future    Standing Expiration Date:   01/06/2019  . Polysomnography 4 or more parameters    Standing Status:   Future    Standing Expiration Date:   01/06/2019    Order Specific Question:   Where should this test be performed:    Answer:   [facility]       Zoila Shutter MD

## 2018-01-05 NOTE — Patient Instructions (Signed)
[place] at [facility] Discharge Instructions  You saw Dr. Walden Field today. We will set you up for a sleep study and Ct scan. You will follow up with [place] in a months with labs a day prior.    Thank you for choosing [place] at [facility] to provide your oncology and hematology care.  To afford each patient quality time with our provider, please arrive at least 15 minutes before your scheduled appointment time.   If you have a lab appointment with the [facility] please come in thru the  Main Entrance and check in at the main information desk  You need to re-schedule your appointment should you arrive 10 or more minutes late.  We strive to give you quality time with our providers, and arriving late affects you and other patients whose appointments are after yours.  Also, if you no show three or more times for appointments you may be dismissed from the clinic at the providers discretion.     Again, thank you for choosing [facility].  Our hope is that these requests will decrease the amount of time that you wait before being seen by our physicians.       _____________________________________________________________  Should you have questions after your visit to [facility], please contact our office at [phone] between the hours of 8:00 a.m. and 4:30 p.m.  Voicemails left after 4:00 p.m. will not be returned until the following business day.  For prescription refill requests, have your pharmacy contact our office and allow 72 hours.    Cancer Center Support Programs:   > Cancer Support Group  2nd Tuesday of the month 1pm-2pm, Journey Room

## 2018-01-18 ENCOUNTER — Ambulatory Visit (INDEPENDENT_AMBULATORY_CARE_PROVIDER_SITE_OTHER): Payer: Self-pay

## 2018-01-18 DIAGNOSIS — Z1211 Encounter for screening for malignant neoplasm of colon: Secondary | ICD-10-CM

## 2018-01-18 MED ORDER — NA SULFATE-K SULFATE-MG SULF 17.5-3.13-1.6 GM/177ML PO SOLN: 1.0000 | ORAL | 0 refills | Status: DC

## 2018-01-18 NOTE — Patient Instructions (Signed)
Brandon Maddox  [birth year] MRN: [id number]     Procedure Date: 03/14/18 Time to register: 11:00am Place to register: [facility] Procedure Time: 12:00pm Scheduled provider: R. Garfield Cornea, MD    PREPARATION FOR COLONOSCOPY WITH SUPREP BOWEL PREP KIT  Note: Suprep Bowel Prep Kit is a split-dose (2day) regimen. Consumption of BOTH 6-ounce bottles is required for a complete prep.  Please notify us immediately if you are diabetic, take iron supplements, or if you are on Coumadin or any other blood thinners.                                                                                                                                                  2 DAYS BEFORE PROCEDURE:  DATE: 03/12/18   DAY: Sunday Begin clear liquid diet AFTER your lunch meal. NO SOLID FOODS after this point.  1 DAY BEFORE PROCEDURE:  DATE: 03/13/18   DAY: Monday Continue clear liquids the entire day - NO SOLID FOOD.  At 6:00pm: Complete steps 1 through 4 below, using ONE (1) 6-ounce bottle, before going to bed. Step 1:  Pour ONE (1) 6-ounce bottle of SUPREP liquid into the mixing container.  Step 2:  Add cool drinking water to the 16 ounce line on the container and mix.  Note: Dilute the solution concentrate as directed prior to use. Step 3:  DRINK ALL the liquid in the container. Step 4:  You MUST drink an additional two (2) or more 16 ounce containers of water over the next one (1) hour.   Continue clear liquids.  DAY OF PROCEDURE:   DATE: 03/14/18   DAY: Tuesday If you take medications for your heart, blood pressure, or breathing, you may take these medications.   5 hours before your procedure at :7:00am Step 1:  Pour ONE (1) 6-ounce bottle of SUPREP liquid into the mixing container.  Step 2:  Add cool drinking water to the 16 ounce line on the container and mix.  Note: Dilute the solution concentrate as directed prior to use. Step 3:  DRINK ALL the liquid in the container. Step 4:  You MUST  drink an additional two (2) or more 16 ounce containers of water over the next one (1) hour. You MUST complete the final glass of water at least 3 hours before your colonoscopy.   Nothing by mouth past:9:00am  You may take your morning medications with sip of water unless we have instructed otherwise.    Please see below for Dietary Information.  CLEAR LIQUIDS INCLUDE:  Water Jello (NOT red in color)   Ice Popsicles (NOT red in color)   Tea (sugar ok, no milk/cream) Powdered fruit flavored drinks  Coffee (sugar ok, no milk/cream) Gatorade/ Lemonade/ Kool-Aid  (NOT red in color)   Juice: apple, white grape, white cranberry Soft drinks  Clear bullion,  consomme, broth (fat free beef/chicken/vegetable)  Carbonated beverages (any kind)  Strained chicken noodle soup Hard Candy   Remember: Clear liquids are liquids that will allow you to see your fingers on the other side of a clear glass. Be sure liquids are NOT red in color, and not cloudy, but CLEAR.  DO NOT EAT OR DRINK ANY OF THE FOLLOWING:  Dairy products of any kind   Cranberry juice Tomato juice / V8 juice   Grapefruit juice Orange juice     Red grape juice  Do not eat any solid foods, including such foods as: cereal, oatmeal, yogurt, fruits, vegetables, creamed soups, eggs, bread, crackers, pureed foods in a blender, etc.   HELPFUL HINTS FOR DRINKING PREP SOLUTION:   Make sure prep is extremely cold. Mix and refrigerate the the morning of the prep. You may also put in the freezer.   You may try mixing some Crystal Light or Country Time Lemonade if you prefer. Mix in small amounts; add more if necessary.  Try drinking through a straw  Rinse mouth with water or a mouthwash between glasses, to remove after-taste.  Try sipping on a cold beverage /ice/ popsicles between glasses of prep.  Place a piece of sugar-free hard candy in mouth between glasses.  If you become nauseated, try consuming smaller amounts, or stretch out the  time between glasses. Stop for 30-60 minutes, then slowly start back drinking.     OTHER INSTRUCTIONS  You will need a responsible adult at least 66 years of age to accompany you and drive you home. This person must remain in the waiting room during your procedure. The hospital will cancel your procedure if you do not have a responsible adult with you.   1. Wear loose fitting clothing that is easily removed. 2. Leave jewelry and other valuables at home.  3. Remove all body piercing jewelry and leave at home. 4. Total time from sign-in until discharge is approximately 2-3 hours. 5. You should go home directly after your procedure and rest. You can resume normal activities the day after your procedure. 6. The day of your procedure you should not:  Drive  Make legal decisions  Operate machinery  Drink alcohol  Return to work   You may call the office (Dept: [phone]) before 5:00pm, or page the doctor on call [phone]) after 5:00pm, for further instructions, if necessary.   Insurance Information YOU WILL NEED TO CHECK WITH YOUR INSURANCE COMPANY FOR THE BENEFITS OF COVERAGE YOU HAVE FOR THIS PROCEDURE.  UNFORTUNATELY, NOT ALL INSURANCE COMPANIES HAVE BENEFITS TO COVER ALL OR PART OF THESE TYPES OF PROCEDURES.  IT IS YOUR RESPONSIBILITY TO CHECK YOUR BENEFITS, HOWEVER, WE WILL BE GLAD TO ASSIST YOU WITH ANY CODES YOUR INSURANCE COMPANY MAY NEED.    PLEASE NOTE THAT MOST INSURANCE COMPANIES WILL NOT COVER A SCREENING COLONOSCOPY FOR PEOPLE UNDER THE AGE OF 50  IF YOU HAVE BCBS INSURANCE, YOU MAY HAVE BENEFITS FOR A SCREENING COLONOSCOPY BUT IF POLYPS ARE FOUND THE DIAGNOSIS WILL CHANGE AND THEN YOU MAY HAVE A DEDUCTIBLE THAT WILL NEED TO BE MET. SO PLEASE MAKE SURE YOU CHECK YOUR BENEFITS FOR A SCREENING COLONOSCOPY AS WELL AS A DIAGNOSTIC COLONOSCOPY.

## 2018-01-18 NOTE — Progress Notes (Signed)
Gastroenterology Pre-Procedure Review  Request Date:01/18/18 Requesting Physician: Dr.Fagan previous tcs RMR- 10+ years ago, no records in epic.   PATIENT REVIEW QUESTIONS: The patient responded to the following health history questions as indicated:    1. Diabetes Melitis: no 2. Joint replacements in the past 12 months: no 3. Major health problems in the past 3 months: no 4. Has an artificial valve or MVP: no 5. Has a defibrillator: no 6. Has been advised in past to take antibiotics in advance of a procedure like teeth cleaning: yes (hip replacement in 2008) 7. Family history of colon cancer: no  8. Alcohol Use: no-hasnt had any in years 9. History of sleep apnea: yes (not on cpap)  10. History of coronary artery or other vascular stents placed within the last 12 months: no 11. History of any prior anesthesia complications: no    MEDICATIONS & ALLERGIES:    Patient reports the following regarding taking any blood thinners:   Plavix? no Aspirin? yes (81mg ) Coumadin? no Brilinta? no Xarelto? no Eliquis? no Pradaxa? no Savaysa? no Effient? no  Patient confirms/reports the following medications:  Current Outpatient Medications  Medication Sig Dispense Refill  . allopurinol (ZYLOPRIM) 300 MG tablet     . aspirin 81 MG chewable tablet Chew 81 mg by mouth daily.    Marland Kitchen buPROPion (WELLBUTRIN XL) 150 MG 24 hr tablet 450 mg.   4  . metoprolol succinate (TOPROL XL) 25 MG 24 hr tablet Take 1 tablet (25 mg total) by mouth daily. 90 tablet 3  . omeprazole (PRILOSEC OTC) 20 MG tablet 20 mg delayed release tablet; oral every day; Dispense: 30    . Tamsulosin HCl (FLOMAX) 0.4 MG CAPS Take 0.4 mg by mouth daily after supper.      No current facility-administered medications for this visit.     Patient confirms/reports the following allergies:  Allergies  Allergen Reactions  . Penicillins     Mother told him throat swelled up when he took at a young age     No orders of the defined  types were placed in this encounter.   AUTHORIZATION INFORMATION Primary Insurance: [employer] advantage,  ID #: [id number] Pre-Cert / Auth required:no  SCHEDULE INFORMATION: Procedure has been scheduled as follows:  Date: 03/14/18, Time: 12:00  Location: [facility] Dr.Rourk  This Gastroenterology Pre-Precedure Review Form is being routed to the following provider(s): Walden Field NP

## 2018-01-20 NOTE — Progress Notes (Signed)
Will need OV due to high dose Wellburtin

## 2018-01-23 ENCOUNTER — Ambulatory Visit: Payer: PPO | Attending: Internal Medicine | Admitting: Neurology

## 2018-01-23 DIAGNOSIS — R1084 Generalized abdominal pain: Secondary | ICD-10-CM | POA: Insufficient documentation

## 2018-01-23 DIAGNOSIS — D751 Secondary polycythemia: Secondary | ICD-10-CM | POA: Insufficient documentation

## 2018-01-23 DIAGNOSIS — G4733 Obstructive sleep apnea (adult) (pediatric): Secondary | ICD-10-CM | POA: Diagnosis not present

## 2018-01-23 DIAGNOSIS — Z7982 Long term (current) use of aspirin: Secondary | ICD-10-CM | POA: Diagnosis not present

## 2018-01-23 DIAGNOSIS — Z79899 Other long term (current) drug therapy: Secondary | ICD-10-CM | POA: Insufficient documentation

## 2018-01-23 DIAGNOSIS — Z720 Tobacco use: Secondary | ICD-10-CM | POA: Insufficient documentation

## 2018-01-23 NOTE — Progress Notes (Signed)
Please schedule ov and I will mail him a letter.  

## 2018-01-23 NOTE — Progress Notes (Signed)
Letter mailed to the pt. I have taken him off the schedule and called carolyn to cancel orders.

## 2018-01-23 NOTE — Progress Notes (Signed)
PATIENT SCHEDULED  °

## 2018-01-25 ENCOUNTER — Ambulatory Visit (HOSPITAL_COMMUNITY)
Admission: RE | Admit: 2018-01-25 | Discharge: 2018-01-25 | Disposition: A | Discharge status: Home / Self Care | Payer: PPO | Source: Ambulatory Visit | Attending: Internal Medicine | Admitting: Internal Medicine

## 2018-01-25 DIAGNOSIS — Z720 Tobacco use: Secondary | ICD-10-CM | POA: Insufficient documentation

## 2018-01-25 DIAGNOSIS — R1084 Generalized abdominal pain: Secondary | ICD-10-CM

## 2018-01-25 DIAGNOSIS — M8448XA Pathological fracture, other site, initial encounter for fracture: Secondary | ICD-10-CM | POA: Diagnosis not present

## 2018-01-25 DIAGNOSIS — R0602 Shortness of breath: Secondary | ICD-10-CM | POA: Diagnosis not present

## 2018-01-25 DIAGNOSIS — J9811 Atelectasis: Secondary | ICD-10-CM | POA: Insufficient documentation

## 2018-01-25 DIAGNOSIS — K5730 Diverticulosis of large intestine without perforation or abscess without bleeding: Secondary | ICD-10-CM | POA: Diagnosis not present

## 2018-01-25 DIAGNOSIS — D751 Secondary polycythemia: Secondary | ICD-10-CM

## 2018-01-25 MED ORDER — IOPAMIDOL (ISOVUE-300) INJECTION 61%
100.0000 mL | Freq: Once | INTRAVENOUS | Status: AC | PRN
  Administered 2018-01-25: 100 mL via INTRAVENOUS

## 2018-02-01 NOTE — Procedures (Signed)
  Nenana A. Merlene Laughter, MD     [email or web address]             NOCTURNAL POLYSOMNOGRAPHY   LOCATION: [facility]   Patient Name: Brandon Maddox, Brandon Maddox Date: 01/23/2018 Gender: Male D.O.B: 1951/01/28 Age (years): 67 Referring Provider: Mathis Dad Higgs Height (inches): 71 Interpreting Physician: Phillips Odor MD, ABSM Weight (lbs): 217 RPSGT: Rosebud Poles BMI: 30 MRN: [id number] Neck Size: 17.00 CLINICAL INFORMATION Sleep Study Type: NPSG     Indication for sleep study: N/A     Epworth Sleepiness Score: 8     SLEEP STUDY TECHNIQUE As per the AASM Manual for the Scoring of Sleep and Associated Events v2.3 (April 2016) with a hypopnea requiring 4% desaturations.  The channels recorded and monitored were frontal, central and occipital EEG, electrooculogram (EOG), submentalis EMG (chin), nasal and oral airflow, thoracic and abdominal wall motion, anterior tibialis EMG, snore microphone, electrocardiogram, and pulse oximetry.  MEDICATIONS Medications self-administered by patient taken the night of the study : N/A  Current Outpatient Medications:  .  allopurinol (ZYLOPRIM) 300 MG tablet, , Disp: , Rfl:  .  aspirin 81 MG chewable tablet, Chew 81 mg by mouth daily., Disp: , Rfl:  .  buPROPion (WELLBUTRIN XL) 150 MG 24 hr tablet, 450 mg. , Disp: , Rfl: 4 .  metoprolol succinate (TOPROL XL) 25 MG 24 hr tablet, Take 1 tablet (25 mg total) by mouth daily., Disp: 90 tablet, Rfl: 3 .  Na Sulfate-K Sulfate-Mg Sulf (SUPREP BOWEL PREP KIT) 17.5-3.13-1.6 GM/177ML SOLN, Take 1 kit by mouth as directed., Disp: 1 Bottle, Rfl: 0 .  omeprazole (PRILOSEC OTC) 20 MG tablet, 20 mg delayed release tablet; oral every day; Dispense: 30, Disp: , Rfl:  .  Tamsulosin HCl (FLOMAX) 0.4 MG CAPS, Take 0.4 mg by mouth daily after supper. , Disp: , Rfl:      SLEEP ARCHITECTURE The study was initiated at 9:44:44 PM and ended at 5:08:01 AM.  Sleep onset time was 64.2 minutes and the  sleep efficiency was 77.6%%. The total sleep time was 344 minutes.  Stage REM latency was 171.0 minutes.  The patient spent 16.6%% of the night in stage N1 sleep, 73.0%% in stage N2 sleep, 1.3%% in stage N3 and 9.2% in REM.  Alpha intrusion was absent.  Supine sleep was 11.36%.  RESPIRATORY PARAMETERS The overall apnea/hypopnea index (AHI) was 64.2 per hour. There were 164 total apneas, including 162 obstructive, 2 central and 0 mixed apneas. There were 204 hypopneas and 3 RERAs.  The AHI during Stage REM sleep was 62.9 per hour.  AHI while supine was 62.9 per hour.  The mean oxygen saturation was 94.6%. The minimum SpO2 during sleep was 78.0%.  loud snoring was noted during this study.  CARDIAC DATA The 2 lead EKG demonstrated sinus rhythm. The mean heart rate was 70.5 beats per minute. Other EKG findings include: Atrial Fibrillation, PVCs. LEG MOVEMENT DATA The total PLMS were 0 with a resulting PLMS index of 0.0. Associated arousal with leg movement index was 0.9.  IMPRESSIONS 1. Severe obstructive sleep apnea is documented with this study. A formal CPAP titration study is recommended.  2. Reduced slow wave sleep is noted.     Delano Metz, MD Diplomate, American Board of Sleep Medicine. ELECTRONICALLY SIGNED ON:  02/01/2018, 9:50 AM [place] PH: [phone]   FX: [phone] [place]

## 2018-02-28 ENCOUNTER — Inpatient Hospital Stay (HOSPITAL_COMMUNITY): Payer: PPO | Attending: Hematology

## 2018-02-28 DIAGNOSIS — Z87891 Personal history of nicotine dependence: Secondary | ICD-10-CM | POA: Insufficient documentation

## 2018-02-28 DIAGNOSIS — G4733 Obstructive sleep apnea (adult) (pediatric): Secondary | ICD-10-CM | POA: Diagnosis not present

## 2018-02-28 DIAGNOSIS — I4891 Unspecified atrial fibrillation: Secondary | ICD-10-CM | POA: Insufficient documentation

## 2018-02-28 DIAGNOSIS — M109 Gout, unspecified: Secondary | ICD-10-CM | POA: Insufficient documentation

## 2018-02-28 DIAGNOSIS — Z8781 Personal history of (healed) traumatic fracture: Secondary | ICD-10-CM | POA: Diagnosis not present

## 2018-02-28 DIAGNOSIS — Z79899 Other long term (current) drug therapy: Secondary | ICD-10-CM | POA: Diagnosis not present

## 2018-02-28 DIAGNOSIS — Z7982 Long term (current) use of aspirin: Secondary | ICD-10-CM | POA: Insufficient documentation

## 2018-02-28 DIAGNOSIS — K219 Gastro-esophageal reflux disease without esophagitis: Secondary | ICD-10-CM | POA: Diagnosis not present

## 2018-02-28 DIAGNOSIS — Z720 Tobacco use: Secondary | ICD-10-CM

## 2018-02-28 DIAGNOSIS — F329 Major depressive disorder, single episode, unspecified: Secondary | ICD-10-CM | POA: Diagnosis not present

## 2018-02-28 DIAGNOSIS — R5383 Other fatigue: Secondary | ICD-10-CM | POA: Insufficient documentation

## 2018-02-28 DIAGNOSIS — D751 Secondary polycythemia: Secondary | ICD-10-CM | POA: Insufficient documentation

## 2018-02-28 DIAGNOSIS — R1084 Generalized abdominal pain: Secondary | ICD-10-CM

## 2018-02-28 LAB — IRON AND TIBC
IRON: 151 ug/dL (ref 45–182)
Saturation Ratios: 42 % — ABNORMAL HIGH (ref 17.9–39.5)
TIBC: 358 ug/dL (ref 250–450)
UIBC: 207 ug/dL

## 2018-02-28 LAB — CBC WITH DIFFERENTIAL/PLATELET
ABS IMMATURE GRANULOCYTES: 0.04 10*3/uL (ref 0.00–0.07)
Basophils Absolute: 0 10*3/uL (ref 0.0–0.1)
Basophils Relative: 1 %
Eosinophils Absolute: 0.1 10*3/uL (ref 0.0–0.5)
Eosinophils Relative: 2 %
HEMATOCRIT: 59.4 % — AB (ref 39.0–52.0)
HEMOGLOBIN: 19.3 g/dL — AB (ref 13.0–17.0)
IMMATURE GRANULOCYTES: 1 %
LYMPHS ABS: 2.1 10*3/uL (ref 0.7–4.0)
LYMPHS PCT: 33 %
MCH: 29.8 pg (ref 26.0–34.0)
MCHC: 32.5 g/dL (ref 30.0–36.0)
MCV: 91.7 fL (ref 80.0–100.0)
Monocytes Absolute: 0.4 10*3/uL (ref 0.1–1.0)
Monocytes Relative: 6 %
NEUTROS ABS: 3.9 10*3/uL (ref 1.7–7.7)
NEUTROS PCT: 57 %
PLATELETS: 154 10*3/uL (ref 150–400)
RBC: 6.48 MIL/uL — ABNORMAL HIGH (ref 4.22–5.81)
RDW: 14.8 % (ref 11.5–15.5)
WBC: 6.6 10*3/uL (ref 4.0–10.5)
nRBC: 0 % (ref 0.0–0.2)

## 2018-02-28 LAB — COMPREHENSIVE METABOLIC PANEL
ALK PHOS: 74 U/L (ref 38–126)
ALT: 27 U/L (ref 0–44)
AST: 21 U/L (ref 15–41)
Albumin: 4.1 g/dL (ref 3.5–5.0)
Anion gap: 7 (ref 5–15)
BUN: 15 mg/dL (ref 8–23)
CALCIUM: 9.1 mg/dL (ref 8.9–10.3)
CHLORIDE: 102 mmol/L (ref 98–111)
CO2: 27 mmol/L (ref 22–32)
Creatinine, Ser: 1.1 mg/dL (ref 0.61–1.24)
GFR calc Af Amer: >60 mL/min (ref >60)
GFR calc non Af Amer: >60 mL/min (ref >60)
GLUCOSE: 134 mg/dL — AB (ref 70–99)
Potassium: 4.3 mmol/L (ref 3.5–5.1)
Sodium: 136 mmol/L (ref 135–145)
TOTAL PROTEIN: 7.2 g/dL (ref 6.5–8.1)
Total Bilirubin: 1.2 mg/dL (ref 0.3–1.2)

## 2018-02-28 LAB — FERRITIN: FERRITIN: 69 ng/mL (ref 24–336)

## 2018-02-28 LAB — LACTATE DEHYDROGENASE: LDH: 138 U/L (ref 98–192)

## 2018-03-03 ENCOUNTER — Ambulatory Visit (INDEPENDENT_AMBULATORY_CARE_PROVIDER_SITE_OTHER): Payer: PPO | Admitting: Pulmonary Disease

## 2018-03-03 ENCOUNTER — Encounter: Payer: Self-pay | Admitting: Pulmonary Disease

## 2018-03-03 VITALS — BP 110/78 | HR 100 | Ht 71.0 in | Wt 227.0 lb

## 2018-03-03 DIAGNOSIS — G4733 Obstructive sleep apnea (adult) (pediatric): Secondary | ICD-10-CM

## 2018-03-03 DIAGNOSIS — Z23 Encounter for immunization: Secondary | ICD-10-CM | POA: Diagnosis not present

## 2018-03-03 NOTE — Patient Instructions (Signed)
Severe obstructive sleep apnea, confirmed on previous studies-2 studies   DME referral for auto CPAP set up  CPAP settings of 5-15 We will see you back in the office in about 3 months We will follow-up with download from your CPAP  We will provide you with high-dose influenza vaccine today  Call with any significant concerns

## 2018-03-03 NOTE — Progress Notes (Signed)
Subjective:    Patient ID: Brandon Maddox, male    DOB: 1950/09/26, 67 y.o.   MRN: [id number]   Chief complaint: Severe obstructive sleep apnea  Patient with a history of severe obstructive sleep apnea confirmed on 2 sleep studies previously  He did not follow-up previously Got a little more concerned with recent study  He does go to bed between 9 and 10 PM, takes him about 10 to 15 minutes to fall asleep, wakes up about 2-3 times during the night to use the body Usual awakening time of 7:53 AM He is tired during the day Denies morning dryness denies headaches  He feels he functions well He still snoring  No underlying lung disease known to patient  Family History  Problem Relation Age of Onset  . Diabetes Mother   . Diabetes Father    Social History   Socioeconomic History  . Marital status: Divorced    Spouse name: Not on file  . Number of children: 1  . Years of education: Not on file  . Highest education level: Not on file  Occupational History    Employer: SELF EMPLOYED  Social Needs  . Financial resource strain: Not on file  . Food insecurity:    Worry: Not on file    Inability: Not on file  . Transportation needs:    Medical: Not on file    Non-medical: Not on file  Tobacco Use  . Smoking status: Former Smoker    Types: Cigarettes    Last attempt to quit: 04/20/2003    Years since quitting: 14.8  . Smokeless tobacco: Never Used  Substance and Sexual Activity  . Alcohol use: No    Alcohol/week: 0.0 standard drinks    Frequency: Never    Comment: History of alcohol abuse in the past  . Drug use: No  . Sexual activity: Yes  Lifestyle  . Physical activity:    Days per week: Not on file    Minutes per session: Not on file  . Stress: Not on file  Relationships  . Social connections:    Talks on phone: Not on file    Gets together: Not on file    Attends religious service: Not on file    Active member of club or organization: Not on file    Attends  meetings of clubs or organizations: Not on file    Relationship status: Not on file  . Intimate partner violence:    Fear of current or ex partner: Not on file    Emotionally abused: Not on file    Physically abused: Not on file    Forced sexual activity: Not on file  Other Topics Concern  . Not on file  Social History Narrative  . Not on file   Past Medical History:  Diagnosis Date  . Closed right scapular fracture   . Depression   . GERD (gastroesophageal reflux disease)   . Gout   . History of alcohol abuse   . Low testosterone   . 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 Ribs, multiple fractures     Review of Systems  Constitutional: Negative for fever and unexpected weight change.  HENT: Negative for congestion, dental problem, ear pain, nosebleeds, postnasal drip, rhinorrhea, sinus pressure, sneezing, sore throat and trouble swallowing.   Eyes: Negative for redness and itching.  Respiratory: Positive for shortness of breath. Negative for cough, chest tightness and wheezing.   Cardiovascular: Negative for palpitations and leg swelling.  Gastrointestinal: Negative for nausea and vomiting.  Genitourinary: Negative for dysuria.  Musculoskeletal: Negative for joint swelling.  Skin: Negative for rash.  Neurological: Negative for headaches.  Hematological: Does not bruise/bleed easily.  Psychiatric/Behavioral: Positive for sleep disturbance. Negative for dysphoric mood. The patient is not nervous/anxious.        Vitals:   03/03/18 0918  BP: 110/78  Pulse: 100  SpO2: 97%   Epworth Sleepiness Scale of 8   Objective:   Physical Exam  Constitutional: He is oriented to person, place, and time. He appears well-developed and well-nourished. No distress.  HENT:  Head: Normocephalic and atraumatic.  Mouth/Throat: Oropharynx is clear and moist. No oropharyngeal exudate.  Crowded oropharynx, Mallampati 3  Eyes: Pupils are equal, round, and reactive to light. EOM are normal.  Right eye exhibits no discharge. Left eye exhibits no discharge.  Neck: Normal range of motion. Neck supple. No tracheal deviation present. No thyromegaly present.  Cardiovascular: Normal rate.  Pulmonary/Chest: Effort normal and breath sounds normal. No respiratory distress.  Abdominal: Soft. Bowel sounds are normal. He exhibits no distension. There is no tenderness.  Musculoskeletal: Normal range of motion. He exhibits no edema.  Neurological: He is alert and oriented to person, place, and time.  Skin: Skin is warm and dry.  Psychiatric: He has a normal mood and affect.   Sleep study was reviewed by myself showing severe obstructive sleep apnea with recommendation for a CPAP titration       Assessment & Plan:  1.  Severe obstructive sleep apnea with significant daytime symptoms  2.  History of polycythemia which is been worked up at present  3.  History of atrial fibrillation  4.  Reformed smoker  Plan We will initiate auto titrating CPAP at 5-15  We will see him back in the office in about 3 months with a download of his machine  Encourage increase physical activity  Pathophysiology of sleep disordered breathing discussed with the patient  Options of treatment discussed with the patient  CPAP therapy will be initiated, follow-up with downloads, encouraged to call with any significant concerns or questions  We will make DME referral to [facility]  We will provide patient with influenza vaccination today-high-dose influenza

## 2018-03-07 ENCOUNTER — Encounter (HOSPITAL_COMMUNITY): Payer: Self-pay | Admitting: Hematology

## 2018-03-07 ENCOUNTER — Inpatient Hospital Stay (HOSPITAL_COMMUNITY): Payer: PPO | Admitting: Hematology

## 2018-03-07 ENCOUNTER — Other Ambulatory Visit: Payer: Self-pay

## 2018-03-07 VITALS — BP 113/86 | HR 66 | Temp 97.6°F | Resp 16 | Wt 227.0 lb

## 2018-03-07 DIAGNOSIS — D751 Secondary polycythemia: Secondary | ICD-10-CM | POA: Insufficient documentation

## 2018-03-07 DIAGNOSIS — R5383 Other fatigue: Secondary | ICD-10-CM

## 2018-03-07 DIAGNOSIS — G4733 Obstructive sleep apnea (adult) (pediatric): Secondary | ICD-10-CM | POA: Diagnosis not present

## 2018-03-07 DIAGNOSIS — Z79899 Other long term (current) drug therapy: Secondary | ICD-10-CM | POA: Diagnosis not present

## 2018-03-07 DIAGNOSIS — I4891 Unspecified atrial fibrillation: Secondary | ICD-10-CM | POA: Diagnosis not present

## 2018-03-07 DIAGNOSIS — K219 Gastro-esophageal reflux disease without esophagitis: Secondary | ICD-10-CM

## 2018-03-07 DIAGNOSIS — F329 Major depressive disorder, single episode, unspecified: Secondary | ICD-10-CM

## 2018-03-07 DIAGNOSIS — Z7982 Long term (current) use of aspirin: Secondary | ICD-10-CM | POA: Diagnosis not present

## 2018-03-07 DIAGNOSIS — Z8781 Personal history of (healed) traumatic fracture: Secondary | ICD-10-CM | POA: Diagnosis not present

## 2018-03-07 DIAGNOSIS — M109 Gout, unspecified: Secondary | ICD-10-CM | POA: Diagnosis not present

## 2018-03-07 DIAGNOSIS — Z87891 Personal history of nicotine dependence: Secondary | ICD-10-CM | POA: Diagnosis not present

## 2018-03-07 NOTE — Patient Instructions (Addendum)
[facility] at [facility] Discharge Instructions  Follow up in 3 months with labs    Thank you for choosing [facility] at [facility] to provide your oncology and hematology care.  To afford each patient quality time with our provider, please arrive at least 15 minutes before your scheduled appointment time.   If you have a lab appointment with the Cancer Center please come in thru the  Main Entrance and check in at the main information desk  You need to re-schedule your appointment should you arrive 10 or more minutes late.  We strive to give you quality time with our providers, and arriving late affects you and other patients whose appointments are after yours.  Also, if you no show three or more times for appointments you may be dismissed from the clinic at the providers discretion.     Again, thank you for choosing [facility].  Our hope is that these requests will decrease the amount of time that you wait before being seen by our physicians.       _____________________________________________________________  Should you have questions after your visit to [facility], please contact our office at [phone] between the hours of 8:00 a.m. and 4:30 p.m.  Voicemails left after 4:00 p.m. will not be returned until the following business day.  For prescription refill requests, have your pharmacy contact our office and allow 72 hours.    Cancer Center Support Programs:   > Cancer Support Group  2nd Tuesday of the month 1pm-2pm, Journey Room    

## 2018-03-07 NOTE — Progress Notes (Signed)
Brandon Maddox, [place] [address]   CLINIC:  Medical Oncology/Hematology  PCP:  [name], MD [address] [place] [address] [phone]   REASON FOR VISIT: Follow-up for polycythemia  CURRENT THERAPY: Intermittent phlebotomies    INTERVAL HISTORY:  Brandon Maddox 67 y.o. male returns for routine follow-up for polycythemia. He is experiencing fatigue daily with mental fogginess. He completed a sleep study. He is suppose to get his C-PAP machine next week. He denies any headaches or vision changes. Denies any nausea, vomiting, or diarrhea. Denies any new pains. Denies any SOB or CP. He reports his appetite at 100% and he has no problems maintaining his weight. His energy level is 75%.     REVIEW OF SYSTEMS:  Review of Systems  Constitutional: Positive for fatigue.  Hematological: Bruises/bleeds easily.  All other systems reviewed and are negative.    PAST MEDICAL/SURGICAL HISTORY:  Past Medical History:  Diagnosis Date  . Closed right scapular fracture   . Depression   . GERD (gastroesophageal reflux disease)   . Gout   . History of alcohol abuse   . Low testosterone   . 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 Ribs, multiple fractures    Past Surgical History:  Procedure Laterality Date  . HIP SURGERY  2010   Dr. Mayer Camel  . LACERATION REPAIR  05/03/2012   Procedure: REPAIR MULTIPLE LACERATIONS;  Surgeon: Jerrell Belfast, MD;  Location: [facility] OR;  Service: ENT;  Laterality: N/A;     SOCIAL HISTORY:  Social History   Socioeconomic History  . Marital status: Divorced    Spouse name: Not on file  . Number of children: 1  . Years of education: Not on file  . Highest education level: Not on file  Occupational History    Employer: SELF EMPLOYED  Social Needs  . Financial resource strain: Not on file  . Food insecurity:    Worry: Not on file    Inability: Not on file  . Transportation needs:    Medical: Not on file   Non-medical: Not on file  Tobacco Use  . Smoking status: Former Smoker    Types: Cigarettes    Last attempt to quit: 04/20/2003    Years since quitting: 14.8  . Smokeless tobacco: Never Used  Substance and Sexual Activity  . Alcohol use: No    Alcohol/week: 0.0 standard drinks    Frequency: Never    Comment: History of alcohol abuse in the past  . Drug use: No  . Sexual activity: Yes  Lifestyle  . Physical activity:    Days per week: Not on file    Minutes per session: Not on file  . Stress: Not on file  Relationships  . Social connections:    Talks on phone: Not on file    Gets together: Not on file    Attends religious service: Not on file    Active member of club or organization: Not on file    Attends meetings of clubs or organizations: Not on file    Relationship status: Not on file  . Intimate partner violence:    Fear of current or ex partner: Not on file    Emotionally abused: Not on file    Physically abused: Not on file    Forced sexual activity: Not on file  Other Topics Concern  . Not on file  Social History Narrative  . Not on file    FAMILY HISTORY:  Family History  Problem Relation Age of Onset  . Diabetes Mother   . Diabetes Father     CURRENT MEDICATIONS:  Outpatient Encounter Medications as of 03/07/2018  Medication Sig Note  . allopurinol (ZYLOPRIM) 300 MG tablet  08/27/2015: Received from: [facility]  . aspirin 81 MG chewable tablet Chew 81 mg by mouth daily.   Marland Kitchen buPROPion (WELLBUTRIN XL) 150 MG 24 hr tablet 450 mg.  08/27/2015: Received from: External Pharmacy  . metoprolol succinate (TOPROL XL) 25 MG 24 hr tablet Take 1 tablet (25 mg total) by mouth daily.   . Na Sulfate-K Sulfate-Mg Sulf (SUPREP BOWEL PREP KIT) 17.5-3.13-1.6 GM/177ML SOLN Take 1 kit by mouth as directed.   Marland Kitchen omeprazole (PRILOSEC OTC) 20 MG tablet 20 mg delayed release tablet; oral every day; Dispense: 30   . Tamsulosin HCl (FLOMAX) 0.4 MG CAPS Take 0.4 mg by  mouth daily after supper.     No facility-administered encounter medications on file as of 03/07/2018.     ALLERGIES:  Allergies  Allergen Reactions  . Penicillins     Mother told him throat swelled up when he took at a young age      PHYSICAL EXAM:  ECOG Performance status: 1  Vitals:   03/07/18 0803  BP: 113/86  Pulse: 66  Resp: 16  Temp: 97.6 F (36.4 C)  SpO2: 100%   Filed Weights   03/07/18 0803  Weight: 227 lb (103 kg)    Physical Exam  Constitutional: He is oriented to person, place, and time. He appears well-developed and well-nourished.  Abdominal: Soft.  Musculoskeletal: Normal range of motion.  Neurological: He is alert and oriented to person, place, and time.  Skin: Skin is warm and dry.  Psychiatric: He has a normal mood and affect. His behavior is normal. Judgment and thought content normal.  Abdomen: No palpable splenomegaly or hepatomegaly.   LABORATORY DATA:  I have reviewed the labs as listed.  CBC    Component Value Date/Time   WBC 6.6 02/28/2018 1128   RBC 6.48 (H) 02/28/2018 1128   HGB 19.3 (H) 02/28/2018 1128   HCT 59.4 (H) 02/28/2018 1128   PLT 154 02/28/2018 1128   MCV 91.7 02/28/2018 1128   MCH 29.8 02/28/2018 1128   MCHC 32.5 02/28/2018 1128   RDW 14.8 02/28/2018 1128   LYMPHSABS 2.1 02/28/2018 1128   MONOABS 0.4 02/28/2018 1128   EOSABS 0.1 02/28/2018 1128   BASOSABS 0.0 02/28/2018 1128   CMP Latest Ref Rng & Units 02/28/2018 12/29/2017 09/01/2017  Glucose 70 - 99 mg/dL 134(H) 112(H) 112(H)  BUN 8 - 23 mg/dL 15 14 23(H)  Creatinine 0.61 - 1.24 mg/dL 1.10 1.04 1.10  Sodium 135 - 145 mmol/L 136 138 135  Potassium 3.5 - 5.1 mmol/L 4.3 4.7 4.2  Chloride 98 - 111 mmol/L 102 103 101  CO2 22 - 32 mmol/L 27 26 27   Calcium 8.9 - 10.3 mg/dL 9.1 9.3 9.2  Total Protein 6.5 - 8.1 g/dL 7.2 7.3 -  Total Bilirubin 0.3 - 1.2 mg/dL 1.2 1.1 -  Alkaline Phos 38 - 126 U/L 74 71 -  AST 15 - 41 U/L 21 20 -  ALT 0 - 44 U/L 27 27 -        DIAGNOSTIC IMAGING:  I have independently reviewed reports of the CT scan and images.  I have also reviewed report of sleep study and discussed with patient.     ASSESSMENT & PLAN:   Polycythemia,  secondary 1.  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  Initially thought to be secondary to testosterone supplements.  He has been off of testosterone since July 2018.  However he continues to have secondary polycythemia. - He has obstructive sleep apnea which was diagnosed, but did not follow-up to get a CPAP machine.  -Last phlebotomy was on 09/07/2017.  He felt better in terms of tiredness after phlebotomy. - BCR/ABL by PCR showed trace positivity for B2 A2 transcript.  Repeat testing in July was negative. - Bone marrow biopsy in November 2018 showed slightly hypercellular bone marrow for age with trilineage hematopoiesis.  No definitive morphological evidence of myeloproliferative disorder.   -we discussed the results of the CBC which showed hemoglobin of 19.3 and hematocrit of 59.4.  I have recommended phlebotomy times two 1 month apart. - We also discussed the results of the CT scan of the abdomen and pelvis on 01/25/2018 which did not show any acute abnormality. - Polysomnography on 01/23/2018 shows significant O2 desats at times with moderate to loud snoring.  He followed up with Dr. Merlene Laughter who recommended CPAP machine.  Patient is awaiting insurance approval. - He will come back to the clinic in 3 months.  2.  Atrial fibrillation: -Rate controlled with beta-blocker.  Not on any anticoagulation.      Orders placed this encounter:  Orders Placed This Encounter  Procedures  . Phlebotomy therapeutic  . CBC with Differential/Platelet  . Comprehensive metabolic panel      Derek Jack, MD [place] [phone]

## 2018-03-07 NOTE — Assessment & Plan Note (Signed)
1.  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  Initially thought to be secondary to testosterone supplements.  He has been off of testosterone since July 2018.  However he continues to have secondary polycythemia. - He has obstructive sleep apnea which was diagnosed, but did not follow-up to get a CPAP machine.  -Last phlebotomy was on 09/07/2017.  He felt better in terms of tiredness after phlebotomy. - BCR/ABL by PCR showed trace positivity for B2 A2 transcript.  Repeat testing in July was negative. - Bone marrow biopsy in November 2018 showed slightly hypercellular bone marrow for age with trilineage hematopoiesis.  No definitive morphological evidence of myeloproliferative disorder.   -we discussed the results of the CBC which showed hemoglobin of 19.3 and hematocrit of 59.4.  I have recommended phlebotomy times two 1 month apart. - We also discussed the results of the CT scan of the abdomen and pelvis on 01/25/2018 which did not show any acute abnormality. - Polysomnography on 01/23/2018 shows significant O2 desats at times with moderate to loud snoring.  He followed up with Dr. Merlene Laughter who recommended CPAP machine.  Patient is awaiting insurance approval. - He will come back to the clinic in 3 months.  2.  Atrial fibrillation: -Rate controlled with beta-blocker.  Not on any anticoagulation.

## 2018-03-10 ENCOUNTER — Encounter (HOSPITAL_COMMUNITY): Payer: Self-pay

## 2018-03-10 ENCOUNTER — Encounter (HOSPITAL_COMMUNITY)
Admission: RE | Admit: 2018-03-10 | Discharge: 2018-03-10 | Disposition: A | Discharge status: Home / Self Care | Payer: PPO | Source: Ambulatory Visit | Attending: Internal Medicine | Admitting: Internal Medicine

## 2018-03-10 DIAGNOSIS — D751 Secondary polycythemia: Secondary | ICD-10-CM | POA: Diagnosis not present

## 2018-03-10 NOTE — Progress Notes (Signed)
Brandon Maddox presents today for phlebotomy per MD orders. HGB/HCT:19.3/59.4 Phlebotomy procedure started at 0835 and ended at 0846 547 cc removed. Patient tolerated procedure well.  VS stable pre and post.  Water consumed post procedure.   IV needle removed intact with adequate pressure held until site stopped bleeding.  Pressure dressing applied.  Patient denies any negative effects.

## 2018-03-13 DIAGNOSIS — G4733 Obstructive sleep apnea (adult) (pediatric): Secondary | ICD-10-CM | POA: Diagnosis not present

## 2018-03-14 ENCOUNTER — Ambulatory Visit (HOSPITAL_COMMUNITY): Admit: 2018-03-14 | Payer: PPO | Admitting: Internal Medicine

## 2018-03-14 ENCOUNTER — Encounter (HOSPITAL_COMMUNITY): Payer: Self-pay

## 2018-03-14 DIAGNOSIS — G4733 Obstructive sleep apnea (adult) (pediatric): Secondary | ICD-10-CM | POA: Diagnosis not present

## 2018-03-14 DIAGNOSIS — F3340 Major depressive disorder, recurrent, in remission, unspecified: Secondary | ICD-10-CM | POA: Diagnosis not present

## 2018-03-14 DIAGNOSIS — D751 Secondary polycythemia: Secondary | ICD-10-CM | POA: Diagnosis not present

## 2018-03-14 SURGERY — COLONOSCOPY
Anesthesia: Moderate Sedation

## 2018-04-07 ENCOUNTER — Encounter (HOSPITAL_COMMUNITY)
Admission: RE | Admit: 2018-04-07 | Discharge: 2018-04-07 | Disposition: A | Discharge status: Home / Self Care | Payer: PPO | Source: Ambulatory Visit | Attending: Internal Medicine | Admitting: Internal Medicine

## 2018-04-07 ENCOUNTER — Encounter (HOSPITAL_COMMUNITY): Payer: Self-pay

## 2018-04-07 DIAGNOSIS — D45 Polycythemia vera: Secondary | ICD-10-CM

## 2018-04-07 DIAGNOSIS — D751 Secondary polycythemia: Secondary | ICD-10-CM | POA: Insufficient documentation

## 2018-04-07 NOTE — Progress Notes (Signed)
Brandon Maddox presents today for phlebotomy per MD orders. HGB/HCT:19.3/59.4 Phlebotomy procedure started at [facility] and ended at [facility]. 621 cc removed. Patient tolerated procedure well. VS stable prior to and after procedure. IV needle removed intact. Pressure applied and pressure dressing in place.  No s/s of ill effects noted.  Water supplied to patient.

## 2018-04-12 DIAGNOSIS — G4733 Obstructive sleep apnea (adult) (pediatric): Secondary | ICD-10-CM | POA: Diagnosis not present

## 2018-04-27 ENCOUNTER — Ambulatory Visit (INDEPENDENT_AMBULATORY_CARE_PROVIDER_SITE_OTHER): Payer: PPO | Admitting: Nurse Practitioner

## 2018-04-27 ENCOUNTER — Other Ambulatory Visit: Payer: Self-pay

## 2018-04-27 ENCOUNTER — Encounter: Payer: Self-pay | Admitting: Nurse Practitioner

## 2018-04-27 ENCOUNTER — Telehealth: Payer: Self-pay

## 2018-04-27 DIAGNOSIS — Z87898 Personal history of other specified conditions: Secondary | ICD-10-CM | POA: Diagnosis not present

## 2018-04-27 DIAGNOSIS — R69 Illness, unspecified: Secondary | ICD-10-CM | POA: Insufficient documentation

## 2018-04-27 DIAGNOSIS — Z1211 Encounter for screening for malignant neoplasm of colon: Secondary | ICD-10-CM

## 2018-04-27 MED ORDER — PEG 3350-KCL-NA BICARB-NACL 420 G PO SOLR: 4000.0000 mL | ORAL | 0 refills | Status: DC

## 2018-04-27 NOTE — Progress Notes (Signed)
Primary Care Physician:  [name], MD Primary Gastroenterologist:  Dr. [name]  Chief Complaint  Patient presents with  . Consult    TCS. Last had done over 10 yrs ago    HPI:   Brandon Maddox is a 68 y.o. male who presents to schedule colonoscopy.  Nurse/phone triage was deferred to office visit due to medications likely necessitating augmented sedation.   No history of colonoscopy found in our system.  Today he states he's doing well overall. He has had a colonoscopy about 12 years ago at [facility]. He states it was normal and no polyps. Denies abdominal pain, N/V, hematochezia, melena, fever, chills, unintentional weight loss. Denies chest pain, dyspnea, dizziness, lightheadedness, syncope, near syncope. Denies any other upper or lower GI symptoms.  Has a history of chronic AFib, not on anticoagulation or calcium channel blocker.  Past Medical History:  Diagnosis Date  . Atrial fibrillation ([place])   . Closed right scapular fracture   . Depression   . GERD (gastroesophageal reflux disease)   . Gout   . History of alcohol abuse   . Low testosterone   . 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 Ribs, multiple fractures     Past Surgical History:  Procedure Laterality Date  . HIP SURGERY  2010   Dr. Mayer Camel  . LACERATION REPAIR  05/03/2012   Procedure: REPAIR MULTIPLE LACERATIONS;  Surgeon: Jerrell Belfast, MD;  Location: [place];  Service: ENT;  Laterality: N/A;  . pyloric stenosis     when 27 weeks old    Current Outpatient Medications  Medication Sig Dispense Refill  . allopurinol (ZYLOPRIM) 300 MG tablet Take 300 mg by mouth daily.     Marland Kitchen aspirin 81 MG chewable tablet Chew 81 mg by mouth daily.    Marland Kitchen buPROPion (WELLBUTRIN XL) 150 MG 24 hr tablet Take 450 mg by mouth daily.   4  . metoprolol succinate (TOPROL XL) 25 MG 24 hr tablet Take 1 tablet (25 mg total) by mouth daily. 90 tablet 3  . omeprazole (PRILOSEC OTC) 20 MG tablet 20 mg delayed release tablet; oral every day;  Dispense: 30    . Tamsulosin HCl (FLOMAX) 0.4 MG CAPS Take 0.4 mg by mouth daily after supper.     . Na Sulfate-K Sulfate-Mg Sulf (SUPREP BOWEL PREP KIT) 17.5-3.13-1.6 GM/177ML SOLN Take 1 kit by mouth as directed. (Patient not taking: Reported on 04/27/2018) 1 Bottle 0   No current facility-administered medications for this visit.     Allergies as of 04/27/2018 - Review Complete 04/27/2018  Allergen Reaction Noted  . Penicillins  01/07/2011    Family History  Problem Relation Age of Onset  . Diabetes Mother   . Diabetes Father   . Colon cancer Neg Hx     Social History   Socioeconomic History  . Marital status: Divorced    Spouse name: Not on file  . Number of children: 1  . Years of education: Not on file  . Highest education level: Not on file  Occupational History    Employer: SELF EMPLOYED  Social Needs  . Financial resource strain: Not on file  . Food insecurity:    Worry: Not on file    Inability: Not on file  . Transportation needs:    Medical: Not on file    Non-medical: Not on file  Tobacco Use  . Smoking status: Former Smoker    Types: Cigarettes    Last attempt to quit: 04/20/2003  Years since quitting: 15.0  . Smokeless tobacco: Never Used  Substance and Sexual Activity  . Alcohol use: No    Alcohol/week: 0.0 standard drinks    Frequency: Never    Comment: History of alcohol abuse in the past; none in 5 years  . Drug use: No  . Sexual activity: Yes  Lifestyle  . Physical activity:    Days per week: Not on file    Minutes per session: Not on file  . Stress: Not on file  Relationships  . Social connections:    Talks on phone: Not on file    Gets together: Not on file    Attends religious service: Not on file    Active member of club or organization: Not on file    Attends meetings of clubs or organizations: Not on file    Relationship status: Not on file  . Intimate partner violence:    Fear of current or ex partner: Not on file    Emotionally  abused: Not on file    Physically abused: Not on file    Forced sexual activity: Not on file  Other Topics Concern  . Not on file  Social History Narrative  . Not on file    Review of Systems: General: Negative for anorexia, weight loss, fever, chills, fatigue, weakness. ENT: Negative for hoarseness, difficulty swallowing. CV: Negative for chest pain, angina, palpitations, peripheral edema.  Respiratory: Negative for dyspnea at rest, cough, sputum, wheezing.  GI: See history of present illness. MS: Negative for joint pain, low back pain.  Derm: Negative for rash or itching.  Endo: Negative for unusual weight change.  Heme: Negative for bruising or bleeding. Allergy: Negative for rash or hives.    Physical Exam: BP 123/81   Pulse 87   Temp (!) 97.4 F (36.3 C) (Oral)   Ht 5' 10.5" (1.791 m)   Wt 226 lb 3.2 oz (102.6 kg)   BMI 32.00 kg/m  General:   Alert and oriented. Pleasant and cooperative. Well-nourished and well-developed.  Head:  Normocephalic and atraumatic. Eyes:  Without icterus, sclera clear and conjunctiva pink.  Ears:  Normal auditory acuity. Cardiovascular:  Irregularly irregular rhythm noted consistent with AFib history, without murmurs appreciated. Extremities without clubbing or edema. Respiratory:  Clear to auscultation bilaterally. No wheezes, rales, or rhonchi. No distress.  Gastrointestinal:  +BS, soft, non-tender and non-distended. No HSM noted. No guarding or rebound. No masses appreciated.  Rectal:  Deferred  Musculoskalatal:  Symmetrical without gross deformities. Neurologic:  Alert and oriented x4;  grossly normal neurologically. Psych:  Alert and cooperative. Normal mood and affect. Heme/Lymph/Immune: No excessive bruising noted.    04/27/2018 11:28 AM   Disclaimer: This note was dictated with voice recognition software. Similar sounding words can inadvertently be transcribed and may not be corrected upon review.

## 2018-04-27 NOTE — Patient Instructions (Signed)
1. We will request her previous colonoscopy report from any pen. 2. We will schedule your colonoscopy for you. 3. Further recommendations will be made after your colonoscopy. 4. Return for follow-up as needed. 5. Call us if you have any questions or concerns.  At [facility] we value your feedback. You may receive a survey about your visit today. Please share your experience as we strive to create trusting relationships with our patients to provide genuine, compassionate, quality care.  We appreciate your understanding and patience as we review any laboratory studies, imaging, and other diagnostic tests that are ordered as we care for you. Our office policy is 5 business days for review of these results, and any emergent or urgent results are addressed in a timely manner for your best interest. If you do not hear from our office in 1 week, please contact us.   We also encourage the use of MyChart, which contains your medical information for your review as well. If you are not enrolled in this feature, an access code is on this after visit summary for your convenience. Thank you for allowing [place] to be involved in your care.  It was great to see you today!  I hope you have a great start to the new year!!

## 2018-04-27 NOTE — Assessment & Plan Note (Signed)
The patient is currently due for a colonoscopy.  His last colonoscopy was about 12 years ago at any time.  The records not available in our system but we will request him for medical records.  He states last time he did not have any polyps.  Nurse triage was deferred to office visit due to medications.  The patient is on a decent dose of Wellbutrin.  After speaking with him he also has a history of alcohol abuse although he has not had alcohol in approximately 5 years.  Given this he would likely benefit from augmented sedation to ensure a comfortable procedure.  We will schedule his colonoscopy.  Follow-up as needed.  Proceed with TCS on propofol/MAC with Dr. [name] in near future: the risks, benefits, and alternatives have been discussed with the patient in detail. The patient states understanding and desires to proceed.  The patient is currently on Wellbutrin 150 mg daily, extended release.  He has a history of alcohol abuse.  Denies drug use.  No other anticoagulants, anxiolytics, chronic pain medications, or antidepressants.  We will plan for the procedure on propofol/MAC to promote adequate sedation.

## 2018-04-27 NOTE — Telephone Encounter (Signed)
Called and informed pt of pre-op appt 05/26/18 at 10:00am. Letter mailed.

## 2018-04-27 NOTE — Assessment & Plan Note (Signed)
The patient describes a history of alcohol use/abuse.  He has not had alcohol in 5 years.  He was commended for his abstinence.  Given this and his Wellbutrin dose, as per above.  He will likely need augmented sedation for his colonoscopy.  We will proceed with his colonoscopy and plan for propofol to promote adequate sedation.  See above for further details.

## 2018-04-28 NOTE — Progress Notes (Signed)
CC'ED TO PCP 

## 2018-05-13 DIAGNOSIS — G4733 Obstructive sleep apnea (adult) (pediatric): Secondary | ICD-10-CM | POA: Diagnosis not present

## 2018-05-26 ENCOUNTER — Encounter (HOSPITAL_COMMUNITY): Payer: Self-pay

## 2018-05-26 ENCOUNTER — Other Ambulatory Visit: Payer: Self-pay

## 2018-05-26 ENCOUNTER — Encounter (HOSPITAL_COMMUNITY)
Admission: RE | Admit: 2018-05-26 | Discharge: 2018-05-26 | Disposition: A | Discharge status: Home / Self Care | Payer: PPO | Source: Ambulatory Visit | Attending: Internal Medicine | Admitting: Internal Medicine

## 2018-05-26 DIAGNOSIS — Z01818 Encounter for other preprocedural examination: Secondary | ICD-10-CM | POA: Insufficient documentation

## 2018-05-31 ENCOUNTER — Encounter: Payer: Self-pay | Admitting: Pulmonary Disease

## 2018-05-31 ENCOUNTER — Ambulatory Visit (INDEPENDENT_AMBULATORY_CARE_PROVIDER_SITE_OTHER): Payer: PPO | Admitting: Pulmonary Disease

## 2018-05-31 VITALS — BP 92/60 | HR 69 | Ht 70.5 in | Wt 224.0 lb

## 2018-05-31 DIAGNOSIS — Z9989 Dependence on other enabling machines and devices: Secondary | ICD-10-CM

## 2018-05-31 DIAGNOSIS — G4733 Obstructive sleep apnea (adult) (pediatric): Secondary | ICD-10-CM

## 2018-05-31 NOTE — Patient Instructions (Signed)
Obstructive sleep apnea on CPAP therapy  Continue current pressures  I will see you back in the office in about 6 months Call with significant concerns

## 2018-05-31 NOTE — Progress Notes (Signed)
Subjective:    Patient ID: Brandon Maddox, male    DOB: 07/09/1950, 68 y.o.   MRN: [id number]   Chief complaint: Severe obstructive sleep apnea  Patient with a history of severe obstructive sleep apnea confirmed on 2 sleep studies previously Has been using CPAP therapy-improvement in symptoms  He did not follow-up previously Got a little more concerned with recent study -Has remained very compliant with CPAP use  He does go to bed between 9 and 10 PM, takes him about 10 to 15 minutes to fall asleep, wakes up about 2-3 times during the night to use the body Usual awakening time of 7:53 AM He is tired during the day Denies morning dryness denies headaches  He feels he functions well  No underlying lung disease known to patient  The CPAP mask leaves a mark on his face He has not been pulling the mask too tightly He readjust a few times during the night secondary to leaks-he feels the current mask is about the best that he has tried  Family History  Problem Relation Age of Onset  . Diabetes Mother   . Diabetes Father   . Colon cancer Neg Hx    Social History   Socioeconomic History  . Marital status: Divorced    Spouse name: Not on file  . Number of children: 1  . Years of education: Not on file  . Highest education level: Not on file  Occupational History    Employer: SELF EMPLOYED  Social Needs  . Financial resource strain: Not on file  . Food insecurity:    Worry: Not on file    Inability: Not on file  . Transportation needs:    Medical: Not on file    Non-medical: Not on file  Tobacco Use  . Smoking status: Former Smoker    Packs/day: 1.00    Years: 10.00    Pack years: 10.00    Types: Cigarettes    Last attempt to quit: 04/20/2003    Years since quitting: 15.1  . Smokeless tobacco: Never Used  Substance and Sexual Activity  . Alcohol use: No    Alcohol/week: 0.0 standard drinks    Frequency: Never    Comment: History of alcohol abuse in the past; none in  5 years  . Drug use: No  . Sexual activity: Yes  Lifestyle  . Physical activity:    Days per week: Not on file    Minutes per session: Not on file  . Stress: Not on file  Relationships  . Social connections:    Talks on phone: Not on file    Gets together: Not on file    Attends religious service: Not on file    Active member of club or organization: Not on file    Attends meetings of clubs or organizations: Not on file    Relationship status: Not on file  . Intimate partner violence:    Fear of current or ex partner: Not on file    Emotionally abused: Not on file    Physically abused: Not on file    Forced sexual activity: Not on file  Other Topics Concern  . Not on file  Social History Narrative  . Not on file   Past Medical History:  Diagnosis Date  . Atrial fibrillation ([place])   . Closed right scapular fracture   . Depression   . GERD (gastroesophageal reflux disease)   . Gout   . History of alcohol abuse   .  Low testosterone   . 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 Ribs, multiple fractures     Review of Systems  Constitutional: Negative for fever and unexpected weight change.  HENT: Negative for congestion, dental problem, ear pain, nosebleeds, postnasal drip, rhinorrhea, sinus pressure, sneezing, sore throat and trouble swallowing.   Eyes: Negative for redness and itching.  Respiratory: Positive for shortness of breath. Negative for cough, chest tightness and wheezing.   Cardiovascular: Negative for palpitations and leg swelling.  Gastrointestinal: Negative for nausea and vomiting.  Genitourinary: Negative.   Musculoskeletal: Negative.   Neurological: Negative.   Hematological: Negative.   Psychiatric/Behavioral: Positive for sleep disturbance. Negative for dysphoric mood. The patient is not nervous/anxious.        Vitals:   05/31/18 0857  BP: 92/60  Pulse: 69  SpO2: 98%   Epworth Sleepiness Scale of 8   Objective:   Physical Exam Constitutional:        General: He is not in acute distress.    Appearance: He is well-developed. He is obese. He is not ill-appearing.  HENT:     Head: Normocephalic and atraumatic.     Nose:     Comments: He does have a mass around his nasal bridge and medial face    Mouth/Throat:     Pharynx: No oropharyngeal exudate.  Eyes:     General:        Right eye: No discharge.        Left eye: No discharge.     Pupils: Pupils are equal, round, and reactive to light.  Neck:     Musculoskeletal: Normal range of motion and neck supple.     Thyroid: No thyromegaly.     Trachea: No tracheal deviation.  Cardiovascular:     Rate and Rhythm: Normal rate.  Pulmonary:     Effort: Pulmonary effort is normal. No respiratory distress.     Breath sounds: Normal breath sounds.  Abdominal:     General: Bowel sounds are normal. There is no distension.     Palpations: Abdomen is soft.     Tenderness: There is no abdominal tenderness.  Neurological:     Mental Status: He is alert.    Sleep study was reviewed by myself showing severe obstructive sleep apnea with recommendation for a CPAP titration  Titration study reviewed showing 93% compliance Settings of 5-15 Median pressure of 11 residual AHI is borderline high at 9.1-this may be related to mask leak    Assessment & Plan:  1.  Severe obstructive sleep apnea with significant daytime symptoms -Has been using CPAP -Significant improvement in symptoms -Feels he is feeling better generally  2.  History of polycythemia which is been worked up at present  3.  History of atrial fibrillation -Controlled  4.  Reformed smoker  Plan Continue auto titrating CPAP at 5-15  We will see him back in the office in about 6 months with a download of his machine  Encourage increase physical activity, encouraged to continue weight loss efforts  Options of treatment discussed with the patient mask adjustments discussed  Encouraged to call if any significant concerns

## 2018-06-01 ENCOUNTER — Encounter (HOSPITAL_COMMUNITY): Payer: Self-pay

## 2018-06-01 ENCOUNTER — Ambulatory Visit (HOSPITAL_COMMUNITY)
Admission: RE | Admit: 2018-06-01 | Discharge: 2018-06-01 | Disposition: A | Discharge status: Home / Self Care | Payer: PPO | Attending: Internal Medicine | Admitting: Internal Medicine

## 2018-06-01 ENCOUNTER — Encounter (HOSPITAL_COMMUNITY)
Admission: RE | Disposition: A | Discharge status: Home / Self Care | Payer: Self-pay | Source: Home / Self Care | Attending: Internal Medicine

## 2018-06-01 ENCOUNTER — Ambulatory Visit (HOSPITAL_COMMUNITY): Payer: PPO | Admitting: Anesthesiology

## 2018-06-01 DIAGNOSIS — Z87891 Personal history of nicotine dependence: Secondary | ICD-10-CM | POA: Diagnosis not present

## 2018-06-01 DIAGNOSIS — M109 Gout, unspecified: Secondary | ICD-10-CM | POA: Insufficient documentation

## 2018-06-01 DIAGNOSIS — F329 Major depressive disorder, single episode, unspecified: Secondary | ICD-10-CM | POA: Insufficient documentation

## 2018-06-01 DIAGNOSIS — Z7982 Long term (current) use of aspirin: Secondary | ICD-10-CM | POA: Diagnosis not present

## 2018-06-01 DIAGNOSIS — Z1211 Encounter for screening for malignant neoplasm of colon: Secondary | ICD-10-CM | POA: Diagnosis not present

## 2018-06-01 DIAGNOSIS — D123 Benign neoplasm of transverse colon: Secondary | ICD-10-CM | POA: Diagnosis not present

## 2018-06-01 DIAGNOSIS — D120 Benign neoplasm of cecum: Secondary | ICD-10-CM | POA: Insufficient documentation

## 2018-06-01 DIAGNOSIS — Z79899 Other long term (current) drug therapy: Secondary | ICD-10-CM | POA: Insufficient documentation

## 2018-06-01 DIAGNOSIS — I4891 Unspecified atrial fibrillation: Secondary | ICD-10-CM | POA: Insufficient documentation

## 2018-06-01 DIAGNOSIS — K219 Gastro-esophageal reflux disease without esophagitis: Secondary | ICD-10-CM | POA: Diagnosis not present

## 2018-06-01 DIAGNOSIS — Z880 Allergy status to penicillin: Secondary | ICD-10-CM | POA: Diagnosis not present

## 2018-06-01 DIAGNOSIS — K5730 Diverticulosis of large intestine without perforation or abscess without bleeding: Secondary | ICD-10-CM | POA: Diagnosis not present

## 2018-06-01 HISTORY — PX: COLONOSCOPY WITH PROPOFOL: SHX5780

## 2018-06-01 HISTORY — PX: POLYPECTOMY: SHX5525

## 2018-06-01 SURGERY — COLONOSCOPY WITH PROPOFOL
Anesthesia: Monitor Anesthesia Care

## 2018-06-01 MED ORDER — EPHEDRINE SULFATE 50 MG/ML IJ SOLN
INTRAMUSCULAR | Status: DC | PRN
  Administered 2018-06-01 (×2): 10 mg via INTRAVENOUS

## 2018-06-01 MED ORDER — PROPOFOL 500 MG/50ML IV EMUL
INTRAVENOUS | Status: DC | PRN
  Administered 2018-06-01: 200 ug/kg/min via INTRAVENOUS

## 2018-06-01 MED ORDER — LACTATED RINGERS IV SOLN
INTRAVENOUS | Status: DC
  Administered 2018-06-01: 10:00:00 via INTRAVENOUS

## 2018-06-01 MED ORDER — CHLORHEXIDINE GLUCONATE CLOTH 2 % EX PADS: 6.0000 | MEDICATED_PAD | Freq: Once | CUTANEOUS | Status: DC

## 2018-06-01 MED ORDER — PROPOFOL 10 MG/ML IV BOLUS
INTRAVENOUS | Status: DC | PRN
  Administered 2018-06-01: 10 mg via INTRAVENOUS
  Administered 2018-06-01: 20 mg via INTRAVENOUS
  Administered 2018-06-01 (×2): 10 mg via INTRAVENOUS

## 2018-06-01 MED ORDER — LACTATED RINGERS IV SOLN: INTRAVENOUS | Status: DC

## 2018-06-01 MED ORDER — HYDROMORPHONE HCL 1 MG/ML IJ SOLN: 0.2500 mg | INTRAMUSCULAR | Status: DC | PRN

## 2018-06-01 MED ORDER — MEPERIDINE HCL 100 MG/ML IJ SOLN: 6.2500 mg | INTRAMUSCULAR | Status: DC | PRN

## 2018-06-01 MED ORDER — STERILE WATER FOR IRRIGATION IR SOLN
Status: DC | PRN
  Administered 2018-06-01: 12:00:00

## 2018-06-01 MED ORDER — HYDROCODONE-ACETAMINOPHEN 7.5-325 MG PO TABS: 1.0000 | ORAL_TABLET | Freq: Once | ORAL | Status: DC | PRN

## 2018-06-01 MED ORDER — PROMETHAZINE HCL 25 MG/ML IJ SOLN: 6.2500 mg | INTRAMUSCULAR | Status: DC | PRN

## 2018-06-01 NOTE — Op Note (Signed)
[facility] Patient Name: Brandon Maddox Procedure Date: 06/01/2018 12:00 PM MRN: [id number] Date of Birth: 03-Sep-1950 Attending MD: Norvel Richards , MD CSN: [id number] Age: 68 Admit Type: Outpatient Procedure:                Colonoscopy Indications:              Screening for colorectal malignant neoplasm Providers:                Norvel Richards, MD, Gerome Sam, RN,                            Tammy Vaught, RN, Randa Spike, Technician Referring MD:              Medicines:                Propofol per Anesthesia Complications:            No immediate complications. Estimated Blood Loss:     Estimated blood loss was minimal. Procedure:                Pre-Anesthesia Assessment:                           - Prior to the procedure, a History and Physical                            was performed, and patient medications and                            allergies were reviewed. The patient's tolerance of                            previous anesthesia was also reviewed. The risks                            and benefits of the procedure and the sedation                            options and risks were discussed with the patient.                            All questions were answered, and informed consent                            was obtained. Prior Anticoagulants: The patient has                            taken no previous anticoagulant or antiplatelet                            agents. ASA Grade Assessment: II - A patient with                            mild systemic disease. After reviewing the risks  and benefits, the patient was deemed in                            satisfactory condition to undergo the procedure.                           After obtaining informed consent, the colonoscope                            was passed under direct vision. Throughout the                            procedure, the patient's blood pressure, pulse, and                          oxygen saturations were monitored continuously. The                            [id number]-[id number] ([id number]) scope was introduced through                            the and advanced to the the cecum, identified by                            appendiceal orifice and ileocecal valve. The                            [id number]-PH190L ([id number]) scope was introduced through                            the anus and advanced to the the cecum, identified                            by appendiceal orifice and ileocecal valve. The                            colonoscopy was performed without difficulty. Scope In: 12:19:14 PM Scope Out: 12:35:45 PM Scope Withdrawal Time: 0 hours 12 minutes 44 seconds  Total Procedure Duration: 0 hours 16 minutes 31 seconds  Findings:      The perianal and digital rectal examinations were normal.      Three sessile polyps were found in the hepatic flexure and cecum. The       polyps were 4 to 6 mm in size. These polyps were removed with a cold       snare. Resection and retrieval were complete. Estimated blood loss was       minimal.      Scattered medium-mouthed diverticula were found in the entire colon.      The exam was otherwise without abnormality on direct and retroflexion       views. Impression:               - Three 4 to 6 mm polyps at the hepatic flexure and  in the cecum, removed with a cold snare. Resected                            and retrieved.                           - Diverticulosis in the entire examined colon.                           - The examination was otherwise normal on direct                            and retroflexion views. Moderate Sedation:      Moderate (conscious) sedation was personally administered by an       anesthesia professional. The following parameters were monitored: oxygen       saturation, heart rate, blood pressure, respiratory rate, EKG, adequacy       of pulmonary ventilation, and  response to care. Recommendation:           - Patient has a contact number available for                            emergencies. The signs and symptoms of potential                            delayed complications were discussed with the                            patient. Return to normal activities tomorrow.                            Written discharge instructions were provided to the                            patient.                           - Resume previous diet.                           - Continue present medications.                           - Repeat colonoscopy date to be determined after                            pending pathology results are reviewed for                            surveillance.                           - Return to GI office (date not yet determined). Procedure Code(s):        --- Professional ---  45385, Colonoscopy, flexible; with removal of                            tumor(s), polyp(s), or other lesion(s) by snare                            technique Diagnosis Code(s):        --- Professional ---                           Z12.11, Encounter for screening for malignant                            neoplasm of colon                           D12.3, Benign neoplasm of transverse colon (hepatic                            flexure or splenic flexure)                           D12.0, Benign neoplasm of cecum                           K57.30, Diverticulosis of large intestine without                            perforation or abscess without bleeding CPT copyright 2018 American Medical Association. All rights reserved. The codes documented in this report are preliminary and upon coder review may  be revised to meet current compliance requirements. Cristopher Estimable. Elveria Lauderbaugh, MD Norvel Richards, MD 06/01/2018 12:45:41 PM This report has been signed electronically. Number of Addenda: 0

## 2018-06-01 NOTE — Transfer of Care (Signed)
Immediate Anesthesia Transfer of Care Note  Patient: Brandon Maddox  Procedure(s) Performed: COLONOSCOPY WITH PROPOFOL (N/A ) POLYPECTOMY  Patient Location: PACU  Anesthesia Type:MAC  Level of Consciousness: awake  Airway & Oxygen Therapy: Patient Spontanous Breathing  Post-op Assessment: Report given to RN  Post vital signs: Reviewed  Last Vitals:  Vitals Value Taken Time  BP    Temp    Pulse 81 06/01/2018 12:42 PM  Resp 22 06/01/2018 12:42 PM  SpO2 96 % 06/01/2018 12:42 PM  Vitals shown include unvalidated device data.  Last Pain:  Vitals:   06/01/18 1215  TempSrc:   PainSc: 0-No pain      Patients Stated Pain Goal: 9 (35/82/51 8984)  Complications: No apparent anesthesia complications

## 2018-06-01 NOTE — H&P (Signed)
@LOGO @   Primary Care Physician:  [name], MD Primary Gastroenterologist:  Dr. [name]  Pre-Procedure History & Physical: HPI:  [name] is a 68 y.o. male is here for a screening colonoscopy.   Average her screening examination.  Past Medical History:  Diagnosis Date  . Atrial fibrillation ([place])   . Closed right scapular fracture   . Depression   . GERD (gastroesophageal reflux disease)   . Gout   . History of alcohol abuse   . Low testosterone   . 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 Ribs, multiple fractures     Past Surgical History:  Procedure Laterality Date  . HIP SURGERY Right 2010   Dr. Dennis Bast and then replacement  . LACERATION REPAIR  05/03/2012   Procedure: REPAIR MULTIPLE LACERATIONS;  Surgeon: Jerrell Belfast, MD;  Location: [facility];  Service: ENT;  Laterality: N/A;  . 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 Authorizing Provider  allopurinol (ZYLOPRIM) 300 MG tablet Take 300 mg by mouth daily.  04/29/15  Yes [provider]  aspirin EC 81 MG tablet Take 81 mg by mouth daily.   Yes [provider]  buPROPion (WELLBUTRIN XL) 150 MG 24 hr tablet Take 450 mg by mouth daily.  08/26/15  Yes [provider]  metoprolol succinate (TOPROL XL) 25 MG 24 hr tablet Take 1 tablet (25 mg total) by mouth daily. 08/27/15  Yes Satira Sark, MD  Na Sulfate-K Sulfate-Mg Sulf (SUPREP BOWEL PREP KIT) 17.5-3.13-1.6 GM/177ML SOLN Take 1 kit by mouth as directed. 01/18/18  Yes Carlis Stable, NP  omeprazole (PRILOSEC OTC) 20 MG tablet Take 20 mg by mouth daily.  05/09/12  Yes [provider]  Tamsulosin HCl (FLOMAX) 0.4 MG CAPS Take 0.4 mg by mouth daily.    Yes [provider]    Allergies as of 04/27/2018 - Review Complete 04/27/2018  Allergen Reaction Noted  . Penicillins  01/07/2011    Family History  Problem Relation Age of Onset  . Diabetes Mother    . Diabetes Father   . Colon cancer Neg Hx     Social History   Socioeconomic History  . Marital status: Divorced    Spouse name: Not on file  . Number of children: 1  . Years of education: Not on file  . Highest education level: Not on file  Occupational History    Employer: SELF EMPLOYED  Social Needs  . Financial resource strain: Not on file  . Food insecurity:    Worry: Not on file    Inability: Not on file  . Transportation needs:    Medical: Not on file    Non-medical: Not on file  Tobacco Use  . Smoking status: Former Smoker    Packs/day: 1.00    Years: 10.00    Pack years: 10.00    Types: Cigarettes    Last attempt to quit: 04/20/2003    Years since quitting: 15.1  . Smokeless tobacco: Never Used  Substance and Sexual Activity  . Alcohol use: No    Alcohol/week: 0.0 standard drinks    Frequency: Never    Comment: History of alcohol abuse in the past; none in 5 years  . Drug use: No  . Sexual activity: Yes  Lifestyle  . Physical activity:    Days per week: Not on file    Minutes per session:  Not on file  . Stress: Not on file  Relationships  . Social connections:    Talks on phone: Not on file    Gets together: Not on file    Attends religious service: Not on file    Active member of club or organization: Not on file    Attends meetings of clubs or organizations: Not on file    Relationship status: Not on file  . Intimate partner violence:    Fear of current or ex partner: Not on file    Emotionally abused: Not on file    Physically abused: Not on file    Forced sexual activity: Not on file  Other Topics Concern  . Not on file  Social History Narrative  . Not on file    Review of Systems: See HPI, otherwise negative ROS  Physical Exam: BP 117/67   Pulse 87   Temp 97.9 F (36.6 C) (Oral)   Resp 19   Ht 5' 10.5" (1.791 m)   Wt 101.6 kg   SpO2 94%   BMI 31.69 kg/m  General:   Alert,  Well-developed, well-nourished, pleasant and  cooperative in NAD Lungs:  Clear throughout to auscultation.   No wheezes, crackles, or rhonchi. No acute distress. Heart:  Regular rate and rhythm; no murmurs, clicks, rubs,  or gallops. Abdomen:  Soft, nontender and nondistended. No masses, hepatosplenomegaly or hernias noted. Normal bowel sounds, without guarding, and without rebound.   Impression/Plan: Brandon Maddox is now here to undergo a screening colonoscopy.  Average risk screening examination.  Risks, benefits, limitations, imponderables and alternatives regarding colonoscopy have been reviewed with the patient. Questions have been answered. All parties agreeable.     Notice:  This dictation was prepared with Dragon dictation along with smaller phrase technology. Any transcriptional errors that result from this process are unintentional and may not be corrected upon review.

## 2018-06-01 NOTE — Anesthesia Preprocedure Evaluation (Addendum)
Anesthesia Evaluation    Airway Mallampati: II       Dental  (+) Upper Dentures, Lower Dentures   Pulmonary former smoker,    breath sounds clear to auscultation       Cardiovascular  Rhythm:regular     Neuro/Psych PSYCHIATRIC DISORDERS Depression    GI/Hepatic GERD  ,  Endo/Other    Renal/GU      Musculoskeletal   Abdominal   Peds  Hematology   Anesthesia Other Findings Obesity Afib hx, chronic, ASA qDay, no other anticoagulant Polycythemia vera ETOH abuse  Reproductive/Obstetrics                             Anesthesia Physical Anesthesia Plan  ASA: III  Anesthesia Plan: MAC   Post-op Pain Management:    Induction:   PONV Risk Score and Plan:   Airway Management Planned:   Additional Equipment:   Intra-op Plan:   Post-operative Plan:   Informed Consent: I have reviewed the patients History and Physical, chart, labs and discussed the procedure including the risks, benefits and alternatives for the proposed anesthesia with the patient or authorized representative who has indicated his/her understanding and acceptance.       Plan Discussed with: Anesthesiologist  Anesthesia Plan Comments:        Anesthesia Quick Evaluation

## 2018-06-01 NOTE — Anesthesia Postprocedure Evaluation (Signed)
Anesthesia Post Note  Patient: Brandon Maddox  Procedure(s) Performed: COLONOSCOPY WITH PROPOFOL (N/A ) POLYPECTOMY  Patient location during evaluation: PACU Anesthesia Type: MAC Level of consciousness: awake and alert and oriented Pain management: pain level controlled Vital Signs Assessment: post-procedure vital signs reviewed and stable Respiratory status: spontaneous breathing Cardiovascular status: blood pressure returned to baseline and stable Postop Assessment: no apparent nausea or vomiting Anesthetic complications: no     Last Vitals:  Vitals:   06/01/18 1142 06/01/18 1242  BP: 117/67 99/73  Pulse:    Resp:  18  Temp:  37 C  SpO2:      Last Pain:  Vitals:   06/01/18 1215  TempSrc:   PainSc: 0-No pain                 Ayanna Gheen

## 2018-06-01 NOTE — Discharge Instructions (Signed)
Colonoscopy Discharge Instructions  Read the instructions outlined below and refer to this sheet in the next few weeks. These discharge instructions provide you with general information on caring for yourself after you leave the hospital. Your doctor may also give you specific instructions. While your treatment has been planned according to the most current medical practices available, unavoidable complications occasionally occur. If you have any problems or questions after discharge, call Dr. [name] at [phone]. ACTIVITY  You may resume your regular activity, but move at a slower pace for the next 24 hours.   Take frequent rest periods for the next 24 hours.   Walking will help get rid of the air and reduce the bloated feeling in your belly (abdomen).   No driving for 24 hours (because of the medicine (anesthesia) used during the test).    Do not sign any important legal documents or operate any machinery for 24 hours (because of the anesthesia used during the test).  NUTRITION  Drink plenty of fluids.   You may resume your normal diet as instructed by your doctor.   Begin with a light meal and progress to your normal diet. Heavy or fried foods are harder to digest and may make you feel sick to your stomach (nauseated).   Avoid alcoholic beverages for 24 hours or as instructed.  MEDICATIONS  You may resume your normal medications unless your doctor tells you otherwise.  WHAT YOU CAN EXPECT TODAY  Some feelings of bloating in the abdomen.   Passage of more gas than usual.   Spotting of blood in your stool or on the toilet paper.  IF YOU HAD POLYPS REMOVED DURING THE COLONOSCOPY:  No aspirin products for 7 days or as instructed.   No alcohol for 7 days or as instructed.   Eat a soft diet for the next 24 hours.  FINDING OUT THE RESULTS OF YOUR TEST Not all test results are available during your visit. If your test results are not back during the visit, make an appointment  with your caregiver to find out the results. Do not assume everything is normal if you have not heard from your caregiver or the medical facility. It is important for you to follow up on all of your test results.  SEEK IMMEDIATE MEDICAL ATTENTION IF:  You have more than a spotting of blood in your stool.   Your belly is swollen (abdominal distention).   You are nauseated or vomiting.   You have a temperature over 101.   You have abdominal pain or discomfort that is severe or gets worse throughout the day.   Diverticulosis and colon polyp information provided  Further recommendations to follow pending review of pathology report      Diverticulosis  Diverticulosis is a condition that develops when small pouches (diverticula) form in the wall of the large intestine (colon). The colon is where water is absorbed and stool is formed. The pouches form when the inside layer of the colon pushes through weak spots in the outer layers of the colon. You may have a few pouches or many of them. What are the causes? The cause of this condition is not known. What increases the risk? The following factors may make you more likely to develop this condition:  Being older than age 27. Your risk for this condition increases with age. Diverticulosis is rare among people younger than age 60. By age 61, many people have it.  Eating a low-fiber diet.  Having frequent constipation.  Being overweight.  Not getting enough exercise.  Smoking.  Taking over-the-counter pain medicines, like aspirin and ibuprofen.  Having a family history of diverticulosis. What are the signs or symptoms? In most people, there are no symptoms of this condition. If you do have symptoms, they may include:  Bloating.  Cramps in the abdomen.  Constipation or diarrhea.  Pain in the lower left side of the abdomen. How is this diagnosed? This condition is most often diagnosed during an exam for other colon problems.  Because diverticulosis usually has no symptoms, it often cannot be diagnosed independently. This condition may be diagnosed by:  Using a flexible scope to examine the colon (colonoscopy).  Taking an X-ray of the colon after dye has been put into the colon (barium enema).  Doing a CT scan. How is this treated? You may not need treatment for this condition if you have never developed an infection related to diverticulosis. If you have had an infection before, treatment may include:  Eating a high-fiber diet. This may include eating more fruits, vegetables, and grains.  Taking a fiber supplement.  Taking a live bacteria supplement (probiotic).  Taking medicine to relax your colon.  Taking antibiotic medicines. Follow these instructions at home:  Drink 6-8 glasses of water or more each day to prevent constipation.  Try not to strain when you have a bowel movement.  If you have had an infection before: ? Eat more fiber as directed by your health care provider or your diet and nutrition specialist (dietitian). ? Take a fiber supplement or probiotic, if your health care provider approves.  Take over-the-counter and prescription medicines only as told by your health care provider.  If you were prescribed an antibiotic, take it as told by your health care provider. Do not stop taking the antibiotic even if you start to feel better.  Keep all follow-up visits as told by your health care provider. This is important. Contact a health care provider if:  You have pain in your abdomen.  You have bloating.  You have cramps.  You have not had a bowel movement in 3 days. Get help right away if:  Your pain gets worse.  Your bloating becomes very bad.  You have a fever or chills, and your symptoms suddenly get worse.  You vomit.  You have bowel movements that are bloody or black.  You have bleeding from your rectum. Summary  Diverticulosis is a condition that develops when small  pouches (diverticula) form in the wall of the large intestine (colon).  You may have a few pouches or many of them.  This condition is most often diagnosed during an exam for other colon problems.  If you have had an infection related to diverticulosis, treatment may include increasing the fiber in your diet, taking supplements, or taking medicines. This information is not intended to replace advice given to you by your health care provider. Make sure you discuss any questions you have with your health care provider. Document Released: 01/01/2004 Document Revised: 02/23/2016 Document Reviewed: 02/23/2016 Elsevier Interactive Patient Education  2019 [employer].     Colon Polyps  Polyps are tissue growths inside the body. Polyps can grow in many places, including the large intestine (colon). A polyp may be a round bump or a mushroom-shaped growth. You could have one polyp or several. Most colon polyps are noncancerous (benign). However, some colon polyps can become cancerous over time. Finding and removing the polyps early can help prevent this. What  are the causes? The exact cause of colon polyps is not known. What increases the risk? You are more likely to develop this condition if you:  Have a family history of colon cancer or colon polyps.  Are older than 18 or older than 45 if you are African American.  Have inflammatory bowel disease, such as ulcerative colitis or Crohn's disease.  Have certain hereditary conditions, such as: ? Familial adenomatous polyposis. ? Lynch syndrome. ? Turcot syndrome. ? Peutz-Jeghers syndrome.  Are overweight.  Smoke cigarettes.  Do not get enough exercise.  Drink too much alcohol.  Eat a diet that is high in fat and red meat and low in fiber.  Had childhood cancer that was treated with abdominal radiation. What are the signs or symptoms? Most polyps do not cause symptoms. If you have symptoms, they may include:  Blood coming from  your rectum when having a bowel movement.  Blood in your stool. The stool may look dark red or black.  Abdominal pain.  A change in bowel habits, such as constipation or diarrhea. How is this diagnosed? This condition is diagnosed with a colonoscopy. This is a procedure in which a lighted, flexible scope is inserted into the anus and then passed into the colon to examine the area. Polyps are sometimes found when a colonoscopy is done as part of routine cancer screening tests. How is this treated? Treatment for this condition involves removing any polyps that are found. Most polyps can be removed during a colonoscopy. Those polyps will then be tested for cancer. Additional treatment may be needed depending on the results of testing. Follow these instructions at home: Lifestyle  Maintain a healthy weight, or lose weight if recommended by your health care provider.  Exercise every day or as told by your health care provider.  Do not use any products that contain nicotine or tobacco, such as cigarettes and e-cigarettes. If you need help quitting, ask your health care provider.  If you drink alcohol, limit how much you have: ? 0-1 drink a day for women. ? 0-2 drinks a day for men.  Be aware of how much alcohol is in your drink. In the [place] one drink equals one 12 oz bottle of beer (355 mL), one 5 oz glass of wine (148 mL), or one 1 oz shot of hard liquor (44 mL). Eating and drinking   Eat foods that are high in fiber, such as fruits, vegetables, and whole grains.  Eat foods that are high in calcium and vitamin D, such as milk, cheese, yogurt, eggs, liver, fish, and broccoli.  Limit foods that are high in fat, such as fried foods and desserts.  Limit the amount of red meat and processed meat you eat, such as hot dogs, sausage, bacon, and lunch meats. General instructions  Keep all follow-up visits as told by your health care provider. This is important. ? This includes having  regularly scheduled colonoscopies. ? Talk to your health care provider about when you need a colonoscopy. Contact a health care provider if:  You have new or worsening bleeding during a bowel movement.  You have new or increased blood in your stool.  You have a change in bowel habits.  You lose weight for no known reason. Summary  Polyps are tissue growths inside the body. Polyps can grow in many places, including the colon.  Most colon polyps are noncancerous (benign), but some can become cancerous over time.  This condition is diagnosed with a colonoscopy.  Treatment for this condition involves removing any polyps that are found. Most polyps can be removed during a colonoscopy. This information is not intended to replace advice given to you by your health care provider. Make sure you discuss any questions you have with your health care provider. Document Released: 12/31/2003 Document Revised: 07/21/2017 Document Reviewed: 07/21/2017 Elsevier Interactive Patient Education  2019 [place], Care After These instructions provide you with information about caring for yourself after your procedure. Your health care provider may also give you more specific instructions. Your treatment has been planned according to current medical practices, but problems sometimes occur. Call your health care provider if you have any problems or questions after your procedure. What can I expect after the procedure? After your procedure, you may:  Feel sleepy for several hours.  Feel clumsy and have poor balance for several hours.  Feel forgetful about what happened after the procedure.  Have poor judgment for several hours.  Feel nauseous or vomit.  Have a sore throat if you had a breathing tube during the procedure. Follow these instructions at home: For at least 24 hours after the procedure:      Have a responsible adult stay with you. It is important to  have someone help care for you until you are awake and alert.  Rest as needed.  Do not: ? Participate in activities in which you could fall or become injured. ? Drive. ? Use heavy machinery. ? Drink alcohol. ? Take sleeping pills or medicines that cause drowsiness. ? Make important decisions or sign legal documents. ? Take care of children on your own. Eating and drinking  Follow the diet that is recommended by your health care provider.  If you vomit, drink water, juice, or soup when you can drink without vomiting.  Make sure you have little or no nausea before eating solid foods. General instructions  Take over-the-counter and prescription medicines only as told by your health care provider.  If you have sleep apnea, surgery and certain medicines can increase your risk for breathing problems. Follow instructions from your health care provider about wearing your sleep device: ? Anytime you are sleeping, including during daytime naps. ? While taking prescription pain medicines, sleeping medicines, or medicines that make you drowsy.  If you smoke, do not smoke without supervision.  Keep all follow-up visits as told by your health care provider. This is important. Contact a health care provider if:  You keep feeling nauseous or you keep vomiting.  You feel light-headed.  You develop a rash.  You have a fever. Get help right away if:  You have trouble breathing. Summary  For several hours after your procedure, you may feel sleepy and have poor judgment.  Have a responsible adult stay with you for at least 24 hours or until you are awake and alert. This information is not intended to replace advice given to you by your health care provider. Make sure you discuss any questions you have with your health care provider. Document Released: 07/27/2015 Document Revised: 11/19/2016 Document Reviewed: 07/27/2015 Elsevier Interactive Patient Education  2019 [employer].

## 2018-06-02 ENCOUNTER — Encounter: Payer: Self-pay | Admitting: Internal Medicine

## 2018-06-06 ENCOUNTER — Inpatient Hospital Stay (HOSPITAL_COMMUNITY): Payer: PPO | Attending: Hematology

## 2018-06-06 ENCOUNTER — Encounter (HOSPITAL_COMMUNITY): Payer: Self-pay | Admitting: Internal Medicine

## 2018-06-06 DIAGNOSIS — Z7982 Long term (current) use of aspirin: Secondary | ICD-10-CM | POA: Insufficient documentation

## 2018-06-06 DIAGNOSIS — Z87891 Personal history of nicotine dependence: Secondary | ICD-10-CM | POA: Insufficient documentation

## 2018-06-06 DIAGNOSIS — I4891 Unspecified atrial fibrillation: Secondary | ICD-10-CM | POA: Insufficient documentation

## 2018-06-06 DIAGNOSIS — D751 Secondary polycythemia: Secondary | ICD-10-CM

## 2018-06-06 DIAGNOSIS — Z79899 Other long term (current) drug therapy: Secondary | ICD-10-CM | POA: Diagnosis not present

## 2018-06-06 LAB — COMPREHENSIVE METABOLIC PANEL
ALT: 20 U/L (ref 0–44)
AST: 17 U/L (ref 15–41)
Albumin: 4 g/dL (ref 3.5–5.0)
Alkaline Phosphatase: 64 U/L (ref 38–126)
Anion gap: 7 (ref 5–15)
BUN: 19 mg/dL (ref 8–23)
CO2: 26 mmol/L (ref 22–32)
Calcium: 9.5 mg/dL (ref 8.9–10.3)
Chloride: 105 mmol/L (ref 98–111)
Creatinine, Ser: 1.02 mg/dL (ref 0.61–1.24)
GFR calc Af Amer: >60 mL/min (ref >60)
GFR calc non Af Amer: >60 mL/min (ref >60)
Glucose, Bld: 101 mg/dL — ABNORMAL HIGH (ref 70–99)
POTASSIUM: 4.6 mmol/L (ref 3.5–5.1)
Sodium: 138 mmol/L (ref 135–145)
Total Bilirubin: 0.5 mg/dL (ref 0.3–1.2)
Total Protein: 7 g/dL (ref 6.5–8.1)

## 2018-06-06 LAB — CBC WITH DIFFERENTIAL/PLATELET
Abs Immature Granulocytes: 0.03 10*3/uL (ref 0.00–0.07)
Basophils Absolute: 0 10*3/uL (ref 0.0–0.1)
Basophils Relative: 1 %
Eosinophils Absolute: 0.1 10*3/uL (ref 0.0–0.5)
Eosinophils Relative: 2 %
HCT: 55.3 % — ABNORMAL HIGH (ref 39.0–52.0)
Hemoglobin: 17.6 g/dL — ABNORMAL HIGH (ref 13.0–17.0)
Immature Granulocytes: 1 %
Lymphocytes Relative: 35 %
Lymphs Abs: 2.2 10*3/uL (ref 0.7–4.0)
MCH: 28.3 pg (ref 26.0–34.0)
MCHC: 31.8 g/dL (ref 30.0–36.0)
MCV: 88.8 fL (ref 80.0–100.0)
Monocytes Absolute: 0.5 10*3/uL (ref 0.1–1.0)
Monocytes Relative: 8 %
Neutro Abs: 3.3 10*3/uL (ref 1.7–7.7)
Neutrophils Relative %: 53 %
Platelets: 196 10*3/uL (ref 150–400)
RBC: 6.23 MIL/uL — ABNORMAL HIGH (ref 4.22–5.81)
RDW: 13.7 % (ref 11.5–15.5)
WBC: 6.2 10*3/uL (ref 4.0–10.5)
nRBC: 0 % (ref 0.0–0.2)

## 2018-06-13 ENCOUNTER — Other Ambulatory Visit: Payer: Self-pay

## 2018-06-13 ENCOUNTER — Encounter (HOSPITAL_COMMUNITY): Payer: Self-pay | Admitting: Hematology

## 2018-06-13 ENCOUNTER — Inpatient Hospital Stay (HOSPITAL_BASED_OUTPATIENT_CLINIC_OR_DEPARTMENT_OTHER): Payer: PPO | Admitting: Hematology

## 2018-06-13 VITALS — BP 108/79 | HR 72 | Temp 98.2°F | Resp 18 | Wt 225.4 lb

## 2018-06-13 DIAGNOSIS — G4733 Obstructive sleep apnea (adult) (pediatric): Secondary | ICD-10-CM | POA: Diagnosis not present

## 2018-06-13 DIAGNOSIS — Z87891 Personal history of nicotine dependence: Secondary | ICD-10-CM | POA: Diagnosis not present

## 2018-06-13 DIAGNOSIS — D751 Secondary polycythemia: Secondary | ICD-10-CM | POA: Diagnosis not present

## 2018-06-13 DIAGNOSIS — Z7982 Long term (current) use of aspirin: Secondary | ICD-10-CM | POA: Diagnosis not present

## 2018-06-13 DIAGNOSIS — Z79899 Other long term (current) drug therapy: Secondary | ICD-10-CM

## 2018-06-13 DIAGNOSIS — I4891 Unspecified atrial fibrillation: Secondary | ICD-10-CM

## 2018-06-13 NOTE — Progress Notes (Signed)
Brandon Maddox,  [id number]   CLINIC:  Medical Oncology/Hematology  PCP:  [name], MD [address] [place] [address] [phone]   REASON FOR VISIT: Follow-up for polycythemia  CURRENT THERAPY: Intermittent phlebotomies    INTERVAL HISTORY:  Brandon Maddox 68 y.o. male returns for routine follow-up for polycythemia. He is doing well since his last visit. He had two phlebotomies. He reports he felt better after the phlebotomies. He reports his energy was better right after and has slowly decreased overtime. He also was approved for his BIPAP at night and that has helped how he feeling during the day. He denies any headaches or vision changes. Denies any nausea, vomiting, or diarrhea. Denies any new pains. Had not noticed any recent bleeding such as epistaxis, hematuria or hematochezia. Denies recent chest pain on exertion, shortness of breath on minimal exertion, pre-syncopal episodes, or palpitations. Denies any numbness or tingling in hands or feet. Denies any recent fevers, infections, or recent hospitalizations. Patient reports appetite at 100% and energy level at 0%.   REVIEW OF SYSTEMS:  Review of Systems  Constitutional: Positive for fatigue.  Respiratory: Positive for shortness of breath (with exertion only).   Hematological: Bruises/bleeds easily.  All other systems reviewed and are negative.    PAST MEDICAL/SURGICAL HISTORY:  Past Medical History:  Diagnosis Date  . Atrial fibrillation ([place])   . Closed right scapular fracture   . Depression   . GERD (gastroesophageal reflux disease)   . Gout   . History of alcohol abuse   . Low testosterone   . 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 Ribs, multiple fractures    Past Surgical History:  Procedure Laterality Date  . COLONOSCOPY WITH PROPOFOL N/A 06/01/2018   Procedure: COLONOSCOPY WITH PROPOFOL;  Surgeon: Daneil Dolin, MD;  [facility];  Service:  Endoscopy;  Laterality: N/A;  10:45am  . HIP SURGERY Right 2010   Dr. Dennis Bast and then replacement  . LACERATION REPAIR  05/03/2012   Procedure: REPAIR MULTIPLE LACERATIONS;  Surgeon: Jerrell Belfast, MD;  Location: [facility];  Service: ENT;  Laterality: N/A;  . POLYPECTOMY  06/01/2018   Procedure: POLYPECTOMY;  Surgeon: Daneil Dolin, MD;  Location: [facility];  Service: Endoscopy;;  cecum,hepatic flexure  . 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 Marital status: Divorced    Spouse name: Not on file  . Number of children: 1  . Years of education: Not on file  . Highest education level: Not on file  Occupational History    Employer: SELF EMPLOYED  Social Needs  . Financial resource strain: Not on file  . Food insecurity:    Worry: Not on file    Inability: Not on file  . Transportation needs:    Medical: Not on file    Non-medical: Not on file  Tobacco Use  . Smoking status: Former Smoker    Packs/day: 1.00    Years: 10.00    Pack years: 10.00    Types: Cigarettes    Last attempt to quit: 04/20/2003    Years since quitting: 15.1  . Smokeless tobacco: Never Used  Substance and Sexual Activity  . Alcohol use: No    Alcohol/week: 0.0 standard drinks    Frequency: Never    Comment: History of alcohol abuse in the past; none in 5 years  .  Drug use: No  . Sexual activity: Yes  Lifestyle  . Physical activity:    Days per week: Not on file    Minutes per session: Not on file  . Stress: Not on file  Relationships  . Social connections:    Talks on phone: Not on file    Gets together: Not on file    Attends religious service: Not on file    Active member of club or organization: Not on file    Attends meetings of clubs or organizations: Not on file    Relationship status: Not on file  . Intimate partner violence:    Fear of current or ex partner: Not on file    Emotionally abused: Not on file     Physically abused: Not on file    Forced sexual activity: Not on file  Other Topics Concern  . Not on file  Social History Narrative  . Not on file    FAMILY HISTORY:  Family History  Problem Relation Age of Onset  . Diabetes Mother   . Diabetes Father   . Colon cancer Neg Hx     CURRENT MEDICATIONS:  Outpatient Encounter Medications as of 06/13/2018  Medication Sig Note  . allopurinol (ZYLOPRIM) 300 MG tablet Take 300 mg by mouth daily.    Marland Kitchen aspirin EC 81 MG tablet Take 81 mg by mouth daily.   Marland Kitchen buPROPion (WELLBUTRIN XL) 150 MG 24 hr tablet Take 450 mg by mouth daily.    . metoprolol succinate (TOPROL XL) 25 MG 24 hr tablet Take 1 tablet (25 mg total) by mouth daily.   Marland Kitchen omeprazole (PRILOSEC OTC) 20 MG tablet Take 20 mg by mouth daily.    . Tamsulosin HCl (FLOMAX) 0.4 MG CAPS Take 0.4 mg by mouth daily.    . [DISCONTINUED] Na Sulfate-K Sulfate-Mg Sulf (SUPREP BOWEL PREP KIT) 17.5-3.13-1.6 GM/177ML SOLN Take 1 kit by mouth as directed. 05/15/2018: Before colonoscopy   No facility-administered encounter medications on file as of 06/13/2018.     ALLERGIES:  Allergies  Allergen Reactions  . Penicillins Other (See Comments)    Mother told him throat swelled up when he took at a young age  Did it involve swelling of the face/tongue/throat, SOB, or low BP? Yes Did it involve sudden or severe rash/hives, skin peeling, or any reaction on the inside of your mouth or nose? Unknown Did you need to seek medical attention at a hospital or doctor's office? Yes When did it last happen?Childhood reaction If all above answers are "NO", may proceed with cephalosporin use.      PHYSICAL EXAM:  ECOG Performance status: 1  Vitals:   06/13/18 0826  BP: 108/79  Pulse: 72  Resp: 18  Temp: 98.2 F (36.8 C)  SpO2: 98%   Filed Weights   06/13/18 0826  Weight: 225 lb 6.4 oz (102.2 kg)    Physical Exam Constitutional:      Appearance: Normal appearance. He is normal weight.    Cardiovascular:     Rate and Rhythm: Normal rate and regular rhythm.     Heart sounds: Normal heart sounds.  Pulmonary:     Effort: Pulmonary effort is normal.     Breath sounds: Normal breath sounds.  Abdominal:     General: Abdomen is flat.     Palpations: Abdomen is soft.  Musculoskeletal: Normal range of motion.  Skin:    General: Skin is warm and dry.  Neurological:     Mental Status:  He is alert and oriented to person, place, and time. Mental status is at baseline.  Psychiatric:        Mood and Affect: Mood normal.        Behavior: Behavior normal.        Thought Content: Thought content normal.        Judgment: Judgment normal.      LABORATORY DATA:  I have reviewed the labs as listed.  CBC    Component Value Date/Time   WBC 6.2 06/06/2018 1110   RBC 6.23 (H) 06/06/2018 1110   HGB 17.6 (H) 06/06/2018 1110   HCT 55.3 (H) 06/06/2018 1110   PLT 196 06/06/2018 1110   MCV 88.8 06/06/2018 1110   MCH 28.3 06/06/2018 1110   MCHC 31.8 06/06/2018 1110   RDW 13.7 06/06/2018 1110   LYMPHSABS 2.2 06/06/2018 1110   MONOABS 0.5 06/06/2018 1110   EOSABS 0.1 06/06/2018 1110   BASOSABS 0.0 06/06/2018 1110   CMP Latest Ref Rng & Units 06/06/2018 02/28/2018 12/29/2017  Glucose 70 - 99 mg/dL 101(H) 134(H) 112(H)  BUN 8 - 23 mg/dL _0 Creatinine 0.61 - 1.24 mg/dL 1.02 1.10 1.04  Sodium 135 - 145 mmol/L 138 136 138  Potassium 3.5 - 5.1 mmol/L 4.6 4.3 4.7  Chloride 98 - 111 mmol/L 105 102 103  CO2 22 - 32 mmol/L _1 Calcium 8.9 - 10.3 mg/dL 9.5 9.1 9.3  Total Protein 6.5 - 8.1 g/dL 7.0 7.2 7.3  Total Bilirubin 0.3 - 1.2 mg/dL 0.5 1.2 1.1  Alkaline Phos 38 - 126 U/L 64 74 71  AST 15 - 41 U/L _2 ALT 0 - 44 U/L _3 DIAGNOSTIC IMAGING:  I have independently reviewed the scans and discussed with the patient.   I have reviewed Brandon Finders, NP's note and agree with the documentation.  I personally performed a face-to-face visit, made revisions  and my assessment and plan is as follows.    ASSESSMENT & PLAN:   Polycythemia, secondary 1.  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  Initially thought to be secondary to testosterone supplements.  He has been off of testosterone since July 2018.  However he continues to have secondary polycythemia. - He has obstructive sleep apnea which was diagnosed, but did not follow-up to get a CPAP machine.  -Last phlebotomy was on 09/07/2017.  He felt better in terms of tiredness after phlebotomy. - BCR/ABL by PCR showed trace positivity for B2 A2 transcript.  Repeat testing in July was negative. - Bone marrow biopsy in November 2018 showed slightly hypercellular bone marrow for age with trilineage hematopoiesis.  No definitive morphological evidence of myeloproliferative disorder.   -we discussed the results of the CBC which showed hemoglobin of 19.3 and hematocrit of 59.4.  I have recommended phlebotomy times two 1 month apart. - We also discussed the results of the CT scan of the abdomen and pelvis on 01/25/2018 which did not show any acute abnormality. - Polysomnography on 01/23/2018 shows significant O2 desats at times with moderate to loud snoring.  He followed up with Dr. Merlene Laughter who recommended CPAP machine.  Patient is awaiting insurance approval. - He will come back to the clinic in 3 months.  2.  Atrial fibrillation: -Rate controlled with beta-blocker.  Not on any anticoagulation.      Orders placed this encounter:  Orders Placed This Encounter  Procedures  .  CBC with Differential/Platelet  . Comprehensive metabolic panel  . Lactate dehydrogenase      Derek Jack, MD [place] [phone]

## 2018-06-13 NOTE — Assessment & Plan Note (Signed)
1.  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  Initially thought to be secondary to testosterone supplements.  He has been off of testosterone since July 2018.  However he continues to have secondary polycythemia. - He has obstructive sleep apnea which was diagnosed, but did not follow-up to get a CPAP machine.  -Last phlebotomy was on 09/07/2017.  He felt better in terms of tiredness after phlebotomy. - BCR/ABL by PCR showed trace positivity for B2 A2 transcript.  Repeat testing in July was negative. - Bone marrow biopsy in November 2018 showed slightly hypercellular bone marrow for age with trilineage hematopoiesis.  No definitive morphological evidence of myeloproliferative disorder.   -we discussed the results of the CBC which showed hemoglobin of 19.3 and hematocrit of 59.4.  I have recommended phlebotomy times two 1 month apart. - We also discussed the results of the CT scan of the abdomen and pelvis on 01/25/2018 which did not show any acute abnormality. - Polysomnography on 01/23/2018 shows significant O2 desats at times with moderate to loud snoring.  He followed up with Dr. Merlene Laughter who recommended CPAP machine.  Patient is awaiting insurance approval. - He will come back to the clinic in 3 months.  2.  Atrial fibrillation: -Rate controlled with beta-blocker.  Not on any anticoagulation.

## 2018-06-13 NOTE — Patient Instructions (Addendum)
[facility] at [facility]  Discharge Instructions: Follow up in 4 months with labs   You saw Dr. Katragadda today. _______________________________________________________________  Thank you for choosing [facility] at [facility] to provide your oncology and hematology care.  To afford each patient quality time with our providers, please arrive at least 15 minutes before your scheduled appointment.  You need to re-schedule your appointment if you arrive 10 or more minutes late.  We strive to give you quality time with our providers, and arriving late affects you and other patients whose appointments are after yours.  Also, if you no show three or more times for appointments you may be dismissed from the clinic.  Again, thank you for choosing [facility] at [facility]. Our hope is that these requests will allow you access to exceptional care and in a timely manner. _______________________________________________________________  If you have questions after your visit, please contact our office at [phone] between the hours of 8:30 a.m. and 5:00 p.m. Voicemails left after 4:30 p.m. will not be returned until the following business day. _______________________________________________________________  For prescription refill requests, have your pharmacy contact our office. _______________________________________________________________  Recommendations made by the consultant and any test results will be sent to your referring physician. _______________________________________________________________ 

## 2018-07-12 DIAGNOSIS — G4733 Obstructive sleep apnea (adult) (pediatric): Secondary | ICD-10-CM | POA: Diagnosis not present

## 2018-07-14 ENCOUNTER — Ambulatory Visit: Payer: PPO | Admitting: Cardiology

## 2018-08-12 DIAGNOSIS — G4733 Obstructive sleep apnea (adult) (pediatric): Secondary | ICD-10-CM | POA: Diagnosis not present

## 2018-08-14 DIAGNOSIS — F3340 Major depressive disorder, recurrent, in remission, unspecified: Secondary | ICD-10-CM | POA: Diagnosis not present

## 2018-08-14 DIAGNOSIS — G4733 Obstructive sleep apnea (adult) (pediatric): Secondary | ICD-10-CM | POA: Diagnosis not present

## 2018-09-11 DIAGNOSIS — G4733 Obstructive sleep apnea (adult) (pediatric): Secondary | ICD-10-CM | POA: Diagnosis not present

## 2018-10-06 ENCOUNTER — Inpatient Hospital Stay (HOSPITAL_COMMUNITY): Payer: PPO | Attending: Hematology

## 2018-10-06 ENCOUNTER — Other Ambulatory Visit: Payer: Self-pay

## 2018-10-06 DIAGNOSIS — D45 Polycythemia vera: Secondary | ICD-10-CM | POA: Diagnosis not present

## 2018-10-06 DIAGNOSIS — I4891 Unspecified atrial fibrillation: Secondary | ICD-10-CM | POA: Insufficient documentation

## 2018-10-06 DIAGNOSIS — K219 Gastro-esophageal reflux disease without esophagitis: Secondary | ICD-10-CM | POA: Insufficient documentation

## 2018-10-06 DIAGNOSIS — Z7982 Long term (current) use of aspirin: Secondary | ICD-10-CM | POA: Insufficient documentation

## 2018-10-06 DIAGNOSIS — M109 Gout, unspecified: Secondary | ICD-10-CM | POA: Diagnosis not present

## 2018-10-06 DIAGNOSIS — D751 Secondary polycythemia: Secondary | ICD-10-CM

## 2018-10-06 DIAGNOSIS — F329 Major depressive disorder, single episode, unspecified: Secondary | ICD-10-CM | POA: Diagnosis not present

## 2018-10-06 DIAGNOSIS — Z79899 Other long term (current) drug therapy: Secondary | ICD-10-CM | POA: Insufficient documentation

## 2018-10-06 DIAGNOSIS — Z87891 Personal history of nicotine dependence: Secondary | ICD-10-CM | POA: Diagnosis not present

## 2018-10-06 DIAGNOSIS — R0602 Shortness of breath: Secondary | ICD-10-CM | POA: Insufficient documentation

## 2018-10-06 LAB — CBC WITH DIFFERENTIAL/PLATELET
Abs Immature Granulocytes: 0.03 10*3/uL (ref 0.00–0.07)
Basophils Absolute: 0 10*3/uL (ref 0.0–0.1)
Basophils Relative: 1 %
Eosinophils Absolute: 0.1 10*3/uL (ref 0.0–0.5)
Eosinophils Relative: 1 %
HCT: 53.8 % — ABNORMAL HIGH (ref 39.0–52.0)
Hemoglobin: 17.7 g/dL — ABNORMAL HIGH (ref 13.0–17.0)
Immature Granulocytes: 1 %
Lymphocytes Relative: 31 %
Lymphs Abs: 1.9 10*3/uL (ref 0.7–4.0)
MCH: 29.2 pg (ref 26.0–34.0)
MCHC: 32.9 g/dL (ref 30.0–36.0)
MCV: 88.6 fL (ref 80.0–100.0)
Monocytes Absolute: 0.5 10*3/uL (ref 0.1–1.0)
Monocytes Relative: 8 %
Neutro Abs: 3.6 10*3/uL (ref 1.7–7.7)
Neutrophils Relative %: 58 %
Platelets: 184 10*3/uL (ref 150–400)
RBC: 6.07 MIL/uL — ABNORMAL HIGH (ref 4.22–5.81)
RDW: 15.6 % — ABNORMAL HIGH (ref 11.5–15.5)
WBC: 6 10*3/uL (ref 4.0–10.5)
nRBC: 0 % (ref 0.0–0.2)

## 2018-10-06 LAB — COMPREHENSIVE METABOLIC PANEL
ALT: 20 U/L (ref 0–44)
AST: 18 U/L (ref 15–41)
Albumin: 4 g/dL (ref 3.5–5.0)
Alkaline Phosphatase: 68 U/L (ref 38–126)
Anion gap: 12 (ref 5–15)
BUN: 16 mg/dL (ref 8–23)
CO2: 23 mmol/L (ref 22–32)
Calcium: 8.8 mg/dL — ABNORMAL LOW (ref 8.9–10.3)
Chloride: 101 mmol/L (ref 98–111)
Creatinine, Ser: 1.11 mg/dL (ref 0.61–1.24)
GFR calc Af Amer: >60 mL/min (ref >60)
GFR calc non Af Amer: >60 mL/min (ref >60)
Glucose, Bld: 103 mg/dL — ABNORMAL HIGH (ref 70–99)
Potassium: 4.3 mmol/L (ref 3.5–5.1)
Sodium: 136 mmol/L (ref 135–145)
Total Bilirubin: 0.9 mg/dL (ref 0.3–1.2)
Total Protein: 6.8 g/dL (ref 6.5–8.1)

## 2018-10-06 LAB — LACTATE DEHYDROGENASE: LDH: 122 U/L (ref 98–192)

## 2018-10-12 DIAGNOSIS — G4733 Obstructive sleep apnea (adult) (pediatric): Secondary | ICD-10-CM | POA: Diagnosis not present

## 2018-10-13 ENCOUNTER — Ambulatory Visit (HOSPITAL_COMMUNITY): Payer: PPO | Admitting: Hematology

## 2018-10-16 ENCOUNTER — Encounter (HOSPITAL_COMMUNITY): Payer: Self-pay | Admitting: Hematology

## 2018-10-16 ENCOUNTER — Other Ambulatory Visit: Payer: Self-pay

## 2018-10-16 ENCOUNTER — Inpatient Hospital Stay (HOSPITAL_BASED_OUTPATIENT_CLINIC_OR_DEPARTMENT_OTHER): Payer: PPO | Admitting: Hematology

## 2018-10-16 VITALS — BP 116/58 | HR 85 | Temp 97.3°F | Resp 16 | Wt 226.2 lb

## 2018-10-16 DIAGNOSIS — D751 Secondary polycythemia: Secondary | ICD-10-CM

## 2018-10-16 DIAGNOSIS — I4891 Unspecified atrial fibrillation: Secondary | ICD-10-CM

## 2018-10-16 DIAGNOSIS — F329 Major depressive disorder, single episode, unspecified: Secondary | ICD-10-CM

## 2018-10-16 DIAGNOSIS — K219 Gastro-esophageal reflux disease without esophagitis: Secondary | ICD-10-CM | POA: Diagnosis not present

## 2018-10-16 DIAGNOSIS — R0602 Shortness of breath: Secondary | ICD-10-CM

## 2018-10-16 DIAGNOSIS — M109 Gout, unspecified: Secondary | ICD-10-CM | POA: Diagnosis not present

## 2018-10-16 DIAGNOSIS — D45 Polycythemia vera: Secondary | ICD-10-CM | POA: Diagnosis not present

## 2018-10-16 DIAGNOSIS — Z87891 Personal history of nicotine dependence: Secondary | ICD-10-CM | POA: Diagnosis not present

## 2018-10-16 DIAGNOSIS — Z79899 Other long term (current) drug therapy: Secondary | ICD-10-CM | POA: Diagnosis not present

## 2018-10-16 DIAGNOSIS — Z7982 Long term (current) use of aspirin: Secondary | ICD-10-CM

## 2018-10-16 NOTE — Progress Notes (Signed)
[name], [place] [address]   CLINIC:  Medical Oncology/Hematology  PCP:  [name], MD [address] [place] [address] [phone]   REASON FOR VISIT: Follow-up for polycythemia  CURRENT THERAPY: Intermittent phlebotomies    INTERVAL HISTORY:  [name] 68 y.o. male returns for follow-up of polycythemia.  Denies any aquagenic pruritus.  Denies any erythromelalgia's.  Denies any headaches or vision changes.  He has shortness of breath on exertion which is stable.  He is able to do all his day-to-day activities.  Last phlebotomy was in December 2019.  Denies any ER visits or hospitalizations.  Denies any fevers, night sweats or weight loss in the last 6 months.  REVIEW OF SYSTEMS:  Review of Systems  Constitutional: Negative for fatigue.  Respiratory: Negative for shortness of breath (with exertion only).   Hematological: Does not bruise/bleed easily.  All other systems reviewed and are negative.    PAST MEDICAL/SURGICAL HISTORY:  Past Medical History:  Diagnosis Date  . Atrial fibrillation ([place])   . Closed right scapular fracture   . Depression   . GERD (gastroesophageal reflux disease)   . Gout   . History of alcohol abuse   . Low testosterone   . 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 Ribs, multiple fractures    Past Surgical History:  Procedure Laterality Date  . COLONOSCOPY WITH PROPOFOL N/A 06/01/2018   Procedure: COLONOSCOPY WITH PROPOFOL;  Surgeon: Daneil Dolin, MD;  [facility];  Service: Endoscopy;  Laterality: N/A;  10:45am  . HIP SURGERY Right 2010   Dr. Dennis Bast and then replacement  . LACERATION REPAIR  05/03/2012   Procedure: REPAIR MULTIPLE LACERATIONS;  Surgeon: Jerrell Belfast, MD;  Location: [facility];  Service: ENT;  Laterality: N/A;  . POLYPECTOMY  06/01/2018   Procedure: POLYPECTOMY;  Surgeon: Daneil Dolin, MD;  Location: [facility];  Service: Endoscopy;;  cecum,hepatic  flexure  . 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 Marital status: Divorced    Spouse name: Not on file  . Number of children: 1  . Years of education: Not on file  . Highest education level: Not on file  Occupational History    Employer: SELF EMPLOYED  Social Needs  . Financial resource strain: Not on file  . Food insecurity    Worry: Not on file    Inability: Not on file  . Transportation needs    Medical: Not on file    Non-medical: Not on file  Tobacco Use  . Smoking status: Former Smoker    Packs/day: 1.00    Years: 10.00    Pack years: 10.00    Types: Cigarettes    Quit date: 04/20/2003    Years since quitting: 15.5  . Smokeless tobacco: Never Used  Substance and Sexual Activity  . Alcohol use: No    Alcohol/week: 0.0 standard drinks    Frequency: Never    Comment: History of alcohol abuse in the past; none in 5 years  . Drug use: No  . Sexual activity: Yes  Lifestyle  . Physical activity    Days per week: Not on file    Minutes per session: Not on file  . Stress: Not on file  Relationships  . Social Herbalist on phone: Not on file    Gets together: Not  on file    Attends religious service: Not on file    Active member of club or organization: Not on file    Attends meetings of clubs or organizations: Not on file    Relationship status: Not on file  . Intimate partner violence    Fear of current or ex partner: Not on file    Emotionally abused: Not on file    Physically abused: Not on file    Forced sexual activity: Not on file  Other Topics Concern  . Not on file  Social History Narrative  . Not on file    FAMILY HISTORY:  Family History  Problem Relation Age of Onset  . Diabetes Mother   . Diabetes Father   . Colon cancer Neg Hx     CURRENT MEDICATIONS:  Outpatient Encounter Medications as of 10/16/2018  Medication Sig  . allopurinol (ZYLOPRIM) 300 MG tablet Take  300 mg by mouth daily.   Marland Kitchen aspirin EC 81 MG tablet Take 81 mg by mouth daily.  Marland Kitchen buPROPion (WELLBUTRIN XL) 150 MG 24 hr tablet Take 450 mg by mouth daily.   . metoprolol succinate (TOPROL XL) 25 MG 24 hr tablet Take 1 tablet (25 mg total) by mouth daily.  Marland Kitchen omeprazole (PRILOSEC OTC) 20 MG tablet Take 20 mg by mouth daily.   . Tamsulosin HCl (FLOMAX) 0.4 MG CAPS Take 0.4 mg by mouth daily.    No facility-administered encounter medications on file as of 10/16/2018.     ALLERGIES:  Allergies  Allergen Reactions  . Penicillins Other (See Comments)    Mother told him throat swelled up when he took at a young age  Did it involve swelling of the face/tongue/throat, SOB, or low BP? Yes Did it involve sudden or severe rash/hives, skin peeling, or any reaction on the inside of your mouth or nose? Unknown Did you need to seek medical attention at a hospital or doctor's office? Yes When did it last happen?Childhood reaction If all above answers are "NO", may proceed with cephalosporin use.      PHYSICAL EXAM:  ECOG Performance status: 1  Vitals:   10/16/18 0800  BP: (!) 116/58  Pulse: 85  Resp: 16  Temp: (!) 97.3 F (36.3 C)  SpO2: 98%   Filed Weights   10/16/18 0800  Weight: 226 lb 4 oz (102.6 kg)    Physical Exam Constitutional:      Appearance: Normal appearance. He is normal weight.  Cardiovascular:     Rate and Rhythm: Normal rate and regular rhythm.     Heart sounds: Normal heart sounds.  Pulmonary:     Effort: Pulmonary effort is normal.     Breath sounds: Normal breath sounds.  Abdominal:     General: Abdomen is flat. There is no distension.     Palpations: Abdomen is soft. There is no mass.  Musculoskeletal: Normal range of motion.  Skin:    General: Skin is warm.  Neurological:     Mental Status: He is alert and oriented to person, place, and time. Mental status is at baseline.  Psychiatric:        Mood and Affect: Mood normal.        Behavior: Behavior  normal.      LABORATORY DATA:  I have reviewed the labs as listed.  CBC    Component Value Date/Time   WBC 6.0 10/06/2018 1049   RBC 6.07 (H) 10/06/2018 1049   HGB 17.7 (H) 10/06/2018 1049  HCT 53.8 (H) 10/06/2018 1049   PLT 184 10/06/2018 1049   MCV 88.6 10/06/2018 1049   MCH 29.2 10/06/2018 1049   MCHC 32.9 10/06/2018 1049   RDW 15.6 (H) 10/06/2018 1049   LYMPHSABS 1.9 10/06/2018 1049   MONOABS 0.5 10/06/2018 1049   EOSABS 0.1 10/06/2018 1049   BASOSABS 0.0 10/06/2018 1049   CMP Latest Ref Rng & Units 10/06/2018 06/06/2018 02/28/2018  Glucose 70 - 99 mg/dL 103(H) 101(H) 134(H)  BUN 8 - 23 mg/dL 16 19 15   Creatinine 0.61 - 1.24 mg/dL 1.11 1.02 1.10  Sodium 135 - 145 mmol/L 136 138 136  Potassium 3.5 - 5.1 mmol/L 4.3 4.6 4.3  Chloride 98 - 111 mmol/L 101 105 102  CO2 22 - 32 mmol/L 23 26 27   Calcium 8.9 - 10.3 mg/dL 8.8(L) 9.5 9.1  Total Protein 6.5 - 8.1 g/dL 6.8 7.0 7.2  Total Bilirubin 0.3 - 1.2 mg/dL 0.9 0.5 1.2  Alkaline Phos 38 - 126 U/L 68 64 74  AST 15 - 41 U/L 18 17 21   ALT 0 - 44 U/L 20 20 27        DIAGNOSTIC IMAGING:  I have independently reviewed the scans and discussed with the patient.     ASSESSMENT & PLAN:   Polycythemia vera ([place]) 1.  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 - Initially thought to be secondary to testosterone supplements.  He has been off of testosterone since July 2018 however he continues to have secondary polycythemia. - Last phlebotomy was in December 2019. - He does not have any vasomotor symptoms.  No aquagenic pruritus.  No erythromelalgia's. - BCR/ABL by PCR showed trace positivity for B2 A2 transcript.  Will consider doing FISH testing at some point. -Bone marrow biopsy #2018 showed slightly hypercellular bone marrow for age with trilineage hematopoiesis.  No definite morphological evidence of myeloproliferative disorder. - We reviewed his blood work today.  Hematocrit is 53.8.   Hemoglobin is 17.7.  Normal white count and platelet count. -We will see him back in 6 months for follow-up.      Orders placed this encounter:  Orders Placed This Encounter  Procedures  . CBC with Differential/Platelet  . Comprehensive metabolic panel  . Lactate dehydrogenase      Derek Jack, MD [place] [phone]

## 2018-10-16 NOTE — Patient Instructions (Signed)
[facility] at [facility] Discharge Instructions  You were seen today by Dr. Katragadda. He went over your recent lab results. He will see you back in 6 months for labs and follow up.   Thank you for choosing [facility] at [facility] to provide your oncology and hematology care.  To afford each patient quality time with our provider, please arrive at least 15 minutes before your scheduled appointment time.   If you have a lab appointment with the Cancer Center please come in thru the  Main Entrance and check in at the main information desk  You need to re-schedule your appointment should you arrive 10 or more minutes late.  We strive to give you quality time with our providers, and arriving late affects you and other patients whose appointments are after yours.  Also, if you no show three or more times for appointments you may be dismissed from the clinic at the providers discretion.     Again, thank you for choosing [facility].  Our hope is that these requests will decrease the amount of time that you wait before being seen by our physicians.       _____________________________________________________________  Should you have questions after your visit to [facility], please contact our office at [phone] between the hours of 8:00 a.m. and 4:30 p.m.  Voicemails left after 4:00 p.m. will not be returned until the following business day.  For prescription refill requests, have your pharmacy contact our office and allow 72 hours.    Cancer Center Support Programs:   > Cancer Support Group  2nd Tuesday of the month 1pm-2pm, Journey Room    

## 2018-10-16 NOTE — Assessment & Plan Note (Signed)
1.  Secondary polycythemia: -Jak 2 V6 11F and reflex testing was negative. - Initially thought to be secondary to testosterone supplements.  He has been off of testosterone since July 2018 however he continues to have secondary polycythemia. - Last phlebotomy was in December 2019. - He does not have any vasomotor symptoms.  No aquagenic pruritus.  No erythromelalgia's. - BCR/ABL by PCR showed trace positivity for B2 A2 transcript.  Will consider doing FISH testing at some point. -Bone marrow biopsy #2018 showed slightly hypercellular bone marrow for age with trilineage hematopoiesis.  No definite morphological evidence of myeloproliferative disorder. - We reviewed his blood work today.  Hematocrit is 53.8.  Hemoglobin is 17.7.  Normal white count and platelet count. -We will see him back in 6 months for follow-up.

## 2018-11-11 DIAGNOSIS — G4733 Obstructive sleep apnea (adult) (pediatric): Secondary | ICD-10-CM | POA: Diagnosis not present

## 2018-12-07 DIAGNOSIS — F3340 Major depressive disorder, recurrent, in remission, unspecified: Secondary | ICD-10-CM | POA: Diagnosis not present

## 2018-12-07 DIAGNOSIS — G4733 Obstructive sleep apnea (adult) (pediatric): Secondary | ICD-10-CM | POA: Diagnosis not present

## 2018-12-07 DIAGNOSIS — Z23 Encounter for immunization: Secondary | ICD-10-CM | POA: Diagnosis not present

## 2018-12-07 DIAGNOSIS — D751 Secondary polycythemia: Secondary | ICD-10-CM | POA: Diagnosis not present

## 2018-12-12 DIAGNOSIS — G4733 Obstructive sleep apnea (adult) (pediatric): Secondary | ICD-10-CM | POA: Diagnosis not present

## 2019-01-12 DIAGNOSIS — G4733 Obstructive sleep apnea (adult) (pediatric): Secondary | ICD-10-CM | POA: Diagnosis not present

## 2019-02-11 DIAGNOSIS — G4733 Obstructive sleep apnea (adult) (pediatric): Secondary | ICD-10-CM | POA: Diagnosis not present

## 2019-03-14 DIAGNOSIS — G4733 Obstructive sleep apnea (adult) (pediatric): Secondary | ICD-10-CM | POA: Diagnosis not present

## 2019-04-24 ENCOUNTER — Inpatient Hospital Stay (HOSPITAL_COMMUNITY): Payer: PPO | Attending: Hematology

## 2019-04-24 ENCOUNTER — Other Ambulatory Visit: Payer: Self-pay

## 2019-04-24 DIAGNOSIS — R5383 Other fatigue: Secondary | ICD-10-CM | POA: Insufficient documentation

## 2019-04-24 DIAGNOSIS — Z7982 Long term (current) use of aspirin: Secondary | ICD-10-CM | POA: Insufficient documentation

## 2019-04-24 DIAGNOSIS — Z87891 Personal history of nicotine dependence: Secondary | ICD-10-CM | POA: Insufficient documentation

## 2019-04-24 DIAGNOSIS — K219 Gastro-esophageal reflux disease without esophagitis: Secondary | ICD-10-CM | POA: Insufficient documentation

## 2019-04-24 DIAGNOSIS — Z23 Encounter for immunization: Secondary | ICD-10-CM | POA: Diagnosis not present

## 2019-04-24 DIAGNOSIS — D751 Secondary polycythemia: Secondary | ICD-10-CM | POA: Diagnosis not present

## 2019-04-24 DIAGNOSIS — Z79899 Other long term (current) drug therapy: Secondary | ICD-10-CM | POA: Diagnosis not present

## 2019-04-24 DIAGNOSIS — F329 Major depressive disorder, single episode, unspecified: Secondary | ICD-10-CM | POA: Diagnosis not present

## 2019-04-24 DIAGNOSIS — M109 Gout, unspecified: Secondary | ICD-10-CM | POA: Diagnosis not present

## 2019-04-24 DIAGNOSIS — D45 Polycythemia vera: Secondary | ICD-10-CM | POA: Diagnosis not present

## 2019-04-24 DIAGNOSIS — I4891 Unspecified atrial fibrillation: Secondary | ICD-10-CM | POA: Diagnosis not present

## 2019-04-24 LAB — CBC WITH DIFFERENTIAL/PLATELET
Abs Immature Granulocytes: 0.04 10*3/uL (ref 0.00–0.07)
Basophils Absolute: 0 10*3/uL (ref 0.0–0.1)
Basophils Relative: 0 %
Eosinophils Absolute: 0.1 10*3/uL (ref 0.0–0.5)
Eosinophils Relative: 2 %
HCT: 55.9 % — ABNORMAL HIGH (ref 39.0–52.0)
Hemoglobin: 18.2 g/dL — ABNORMAL HIGH (ref 13.0–17.0)
Immature Granulocytes: 1 %
Lymphocytes Relative: 31 %
Lymphs Abs: 2.1 10*3/uL (ref 0.7–4.0)
MCH: 29.7 pg (ref 26.0–34.0)
MCHC: 32.6 g/dL (ref 30.0–36.0)
MCV: 91.3 fL (ref 80.0–100.0)
Monocytes Absolute: 0.5 10*3/uL (ref 0.1–1.0)
Monocytes Relative: 7 %
Neutro Abs: 4.2 10*3/uL (ref 1.7–7.7)
Neutrophils Relative %: 59 %
Platelets: 187 10*3/uL (ref 150–400)
RBC: 6.12 MIL/uL — ABNORMAL HIGH (ref 4.22–5.81)
RDW: 13.8 % (ref 11.5–15.5)
WBC: 7 10*3/uL (ref 4.0–10.5)
nRBC: 0 % (ref 0.0–0.2)

## 2019-04-24 LAB — COMPREHENSIVE METABOLIC PANEL
ALT: 22 U/L (ref 0–44)
AST: 18 U/L (ref 15–41)
Albumin: 4 g/dL (ref 3.5–5.0)
Alkaline Phosphatase: 66 U/L (ref 38–126)
Anion gap: 7 (ref 5–15)
BUN: 20 mg/dL (ref 8–23)
CO2: 28 mmol/L (ref 22–32)
Calcium: 9.2 mg/dL (ref 8.9–10.3)
Chloride: 102 mmol/L (ref 98–111)
Creatinine, Ser: 1.08 mg/dL (ref 0.61–1.24)
GFR calc Af Amer: >60 mL/min (ref >60)
GFR calc non Af Amer: >60 mL/min (ref >60)
Glucose, Bld: 97 mg/dL (ref 70–99)
Potassium: 5.2 mmol/L — ABNORMAL HIGH (ref 3.5–5.1)
Sodium: 137 mmol/L (ref 135–145)
Total Bilirubin: 1 mg/dL (ref 0.3–1.2)
Total Protein: 6.9 g/dL (ref 6.5–8.1)

## 2019-04-24 LAB — LACTATE DEHYDROGENASE: LDH: 133 U/L (ref 98–192)

## 2019-04-26 ENCOUNTER — Telehealth: Payer: Self-pay | Admitting: Cardiology

## 2019-04-26 NOTE — Telephone Encounter (Signed)
Virtual Visit Pre-Appointment Phone Call  "(Name), I am calling you today to discuss your upcoming appointment. We are currently trying to limit exposure to the virus that causes COVID-19 by seeing patients at home rather than in the office."  "What is the BEST phone number to call the day of the visit?" -  [phone]  1. "Do you have or have access to (through a family member/friend) a smartphone with video capability that we can use for your visit?" a. If yes - list this number in appt notes as "cell" (if different from BEST phone #) and list the appointment type as a VIDEO visit in appointment notes b. If no - list the appointment type as a PHONE visit in appointment notes  2. Confirm consent - "In the setting of the current Covid19 crisis, you are scheduled for a (phone or video) visit with your provider on (date) at (time).  Just as we do with many in-office visits, in order for you to participate in this visit, we must obtain consent.  If you'd like, I can send this to your mychart (if signed up) or email for you to review.  Otherwise, I can obtain your verbal consent now.  All virtual visits are billed to your insurance company just like a normal visit would be.  By agreeing to a virtual visit, we'd like you to understand that the technology does not allow for your provider to perform an examination, and thus may limit your provider's ability to fully assess your condition. If your provider identifies any concerns that need to be evaluated in person, we will make arrangements to do so.  Finally, though the technology is pretty good, we cannot assure that it will always work on either your or our end, and in the setting of a video visit, we may have to convert it to a phone-only visit.  In either situation, we cannot ensure that we have a secure connection.  Are you willing to proceed?" STAFF: Did the patient verbally acknowledge consent to telehealth visit? Document YES/NO here: yes    3. Advise patient to be prepared - "Two hours prior to your appointment, go ahead and check your blood pressure, pulse, oxygen saturation, and your weight (if you have the equipment to check those) and write them all down. When your visit starts, your provider will ask you for this information. If you have an Apple Watch or Kardia device, please plan to have heart rate information ready on the day of your appointment. Please have a pen and paper handy nearby the day of the visit as well."  4. Give patient instructions for MyChart download to smartphone OR [email or web address] as below if video visit (depending on what platform provider is using)  5. Inform patient they will receive a phone call 15 minutes prior to their appointment time (may be from unknown caller ID) so they should be prepared to answer    TELEPHONE CALL NOTE  Brandon Maddox has been deemed a candidate for a follow-up tele-health visit to limit community exposure during the Covid-19 pandemic. I spoke with the patient via phone to ensure availability of phone/video source, confirm preferred email & phone number, and discuss instructions and expectations.  I reminded Brandon Maddox to be prepared with any vital sign and/or heart rhythm information that could potentially be obtained via home monitoring, at the time of his visit. I reminded Brandon Maddox to expect a phone call prior to his visit.  Chanda Busing 04/26/2019 4:50 PM   INSTRUCTIONS FOR DOWNLOADING THE MYCHART APP TO SMARTPHONE  - The patient must first make sure to have activated MyChart and know their login information - If Apple, go to [employer] and type in MyChart in the search bar and download the app. If Android, ask patient to go to [employer] and type in Gamaliel in the search bar and download the app. The app is free but as with any other app downloads, their phone may require them to verify saved payment information or Apple/Android password.  - The  patient will need to then log into the app with their MyChart username and password, and select [facility] as their healthcare provider to link the account. When it is time for your visit, go to the MyChart app, find appointments, and click Begin Video Visit. Be sure to Select Allow for your device to access the Microphone and Camera for your visit. You will then be connected, and your provider will be with you shortly.  **If they have any issues connecting, or need assistance please contact MyChart service desk [phone])**  **If using a computer, in order to ensure the best quality for their visit they will need to use either of the following Internet Browsers: [employer], or Google Chrome**  IF USING DOXIMITY or DOXY.ME - The patient will receive a link just prior to their visit by text.     FULL LENGTH CONSENT FOR TELE-HEALTH VISIT   I hereby voluntarily request, consent and authorize [facility] and its employed or contracted physicians, physician assistants, nurse practitioners or other licensed health care professionals (the Practitioner), to provide me with telemedicine health care services (the "Services") as deemed necessary by the treating Practitioner. I acknowledge and consent to receive the Services by the Practitioner via telemedicine. I understand that the telemedicine visit will involve communicating with the Practitioner through live audiovisual communication technology and the disclosure of certain medical information by electronic transmission. I acknowledge that I have been given the opportunity to request an in-person assessment or other available alternative prior to the telemedicine visit and am voluntarily participating in the telemedicine visit.  I understand that I have the right to withhold or withdraw my consent to the use of telemedicine in the course of my care at any time, without affecting my right to future care or treatment, and that the  Practitioner or I may terminate the telemedicine visit at any time. I understand that I have the right to inspect all information obtained and/or recorded in the course of the telemedicine visit and may receive copies of available information for a reasonable fee.  I understand that some of the potential risks of receiving the Services via telemedicine include:  Marland Kitchen Delay or interruption in medical evaluation due to technological equipment failure or disruption; . Information transmitted may not be sufficient (e.g. poor resolution of images) to allow for appropriate medical decision making by the Practitioner; and/or  . In rare instances, security protocols could fail, causing a breach of personal health information.  Furthermore, I acknowledge that it is my responsibility to provide information about my medical history, conditions and care that is complete and accurate to the best of my ability. I acknowledge that Practitioner's advice, recommendations, and/or decision may be based on factors not within their control, such as incomplete or inaccurate data provided by me or distortions of diagnostic images or specimens that may result from electronic transmissions. I understand that the  practice of medicine is not an Chief Strategy Officer and that Practitioner makes no warranties or guarantees regarding treatment outcomes. I acknowledge that I will receive a copy of this consent concurrently upon execution via email to the email address I last provided but may also request a printed copy by calling the office of [facility].    I understand that my insurance will be billed for this visit.   I have read or had this consent read to me. . I understand the contents of this consent, which adequately explains the benefits and risks of the Services being provided via telemedicine.  . I have been provided ample opportunity to ask questions regarding this consent and the Services and have had my questions answered to my  satisfaction. . I give my informed consent for the services to be provided through the use of telemedicine in my medical care  By participating in this telemedicine visit I agree to the above.

## 2019-04-30 ENCOUNTER — Other Ambulatory Visit: Payer: Self-pay

## 2019-05-01 ENCOUNTER — Encounter (HOSPITAL_COMMUNITY): Payer: Self-pay | Admitting: Nurse Practitioner

## 2019-05-01 ENCOUNTER — Inpatient Hospital Stay (HOSPITAL_COMMUNITY): Payer: PPO | Admitting: Nurse Practitioner

## 2019-05-01 VITALS — BP 101/51 | HR 83 | Temp 97.4°F | Resp 18 | Wt 228.1 lb

## 2019-05-01 DIAGNOSIS — D45 Polycythemia vera: Secondary | ICD-10-CM

## 2019-05-01 DIAGNOSIS — Z0000 Encounter for general adult medical examination without abnormal findings: Secondary | ICD-10-CM | POA: Diagnosis not present

## 2019-05-01 DIAGNOSIS — D751 Secondary polycythemia: Secondary | ICD-10-CM | POA: Diagnosis not present

## 2019-05-01 MED ORDER — INFLUENZA VAC A&B SA ADJ QUAD 0.5 ML IM PRSY
PREFILLED_SYRINGE | INTRAMUSCULAR | Status: AC
  Filled 2019-05-01: qty 0.5

## 2019-05-01 MED ORDER — INFLUENZA VAC A&B SA ADJ QUAD 0.5 ML IM PRSY
0.5000 mL | PREFILLED_SYRINGE | Freq: Once | INTRAMUSCULAR | Status: AC
  Administered 2019-05-01: 09:00:00 0.5 mL via INTRAMUSCULAR

## 2019-05-01 NOTE — Progress Notes (Signed)
Brandon Maddox, [place] [id number]   CLINIC:  Medical Oncology/Hematology  PCP:  [name], MD [address] [place] [address] [phone]   REASON FOR VISIT: Follow-up for secondary polycythemia  CURRENT THERAPY: Observation   INTERVAL HISTORY:  Brandon Maddox 69 y.o. male returns for routine follow-up secondary polycythemia.  Patient reports he has been doing well since his last visit.  He does report increased in fatigue.  He denies any aquagenic pruritus.  He denies any hot flashes.  He denies any headaches.  He denies any vision changes.  Denies any nausea, vomiting, or diarrhea. Denies any new pains. Had not noticed any recent bleeding such as epistaxis, hematuria or hematochezia. Denies recent chest pain on exertion, shortness of breath on minimal exertion, pre-syncopal episodes, or palpitations. Denies any numbness or tingling in hands or feet. Denies any recent fevers, infections, or recent hospitalizations. Patient reports appetite at 100% and energy level at 75%.  He is eating well maintain his weight at this time.    REVIEW OF SYSTEMS:  Review of Systems  Constitutional: Positive for fatigue.  All other systems reviewed and are negative.    PAST MEDICAL/SURGICAL HISTORY:  Past Medical History:  Diagnosis Date  . Atrial fibrillation ([place])   . Closed right scapular fracture   . Depression   . GERD (gastroesophageal reflux disease)   . Gout   . History of alcohol abuse   . Low testosterone   . 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 Ribs, multiple fractures    Past Surgical History:  Procedure Laterality Date  . COLONOSCOPY WITH PROPOFOL N/A 06/01/2018   Procedure: COLONOSCOPY WITH PROPOFOL;  Surgeon: Daneil Dolin, MD;  [facility];  Service: Endoscopy;  Laterality: N/A;  10:45am  . HIP SURGERY Right 2010   Dr. Dennis Bast and then replacement  . LACERATION REPAIR  05/03/2012   Procedure: REPAIR MULTIPLE  LACERATIONS;  Surgeon: Jerrell Belfast, MD;  Location: [facility];  Service: ENT;  Laterality: N/A;  . POLYPECTOMY  06/01/2018   Procedure: POLYPECTOMY;  Surgeon: Daneil Dolin, MD;  [facility];  Service: Endoscopy;;  cecum,hepatic flexure  . 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 Marital status: Divorced    Spouse name: Not on file  . Number of children: 1  . Years of education: Not on file  . Highest education level: Not on file  Occupational History    Employer: SELF EMPLOYED  Tobacco Use  . Smoking status: Former Smoker    Packs/day: 1.00    Years: 10.00    Pack years: 10.00    Types: Cigarettes    Quit date: 04/20/2003    Years since quitting: 16.0  . Smokeless tobacco: Never Used  Substance and Sexual Activity  . Alcohol use: No    Alcohol/week: 0.0 standard drinks    Comment: History of alcohol abuse in the past; none in 5 years  . Drug use: No  . Sexual activity: Yes  Other Topics Concern  . Not on file  Social History Narrative  . Not on file   Social Determinants of Health   Financial Resource Strain:   . Difficulty of Paying Living Expenses: Not on file  Food Insecurity:   . Worried About Charity fundraiser in the Last Year: Not on file  . Ran Out of  Food in the Last Year: Not on file  Transportation Needs:   . Lack of Transportation (Medical): Not on file  . Lack of Transportation (Non-Medical): Not on file  Physical Activity:   . Days of Exercise per Week: Not on file  . Minutes of Exercise per Session: Not on file  Stress:   . Feeling of Stress : Not on file  Social Connections:   . Frequency of Communication with Friends and Family: Not on file  . Frequency of Social Gatherings with Friends and Family: Not on file  . Attends Religious Services: Not on file  . Active Member of Clubs or Organizations: Not on file  . Attends Archivist Meetings: Not on file  .  Marital Status: Not on file  Intimate Partner Violence:   . Fear of Current or Ex-Partner: Not on file  . Emotionally Abused: Not on file  . Physically Abused: Not on file  . Sexually Abused: Not on file    FAMILY HISTORY:  Family History  Problem Relation Age of Onset  . Diabetes Mother   . Diabetes Father   . Colon cancer Neg Hx     CURRENT MEDICATIONS:  Outpatient Encounter Medications as of 05/01/2019  Medication Sig  . allopurinol (ZYLOPRIM) 300 MG tablet Take 300 mg by mouth daily.   Marland Kitchen aspirin EC 81 MG tablet Take 81 mg by mouth daily.  Marland Kitchen buPROPion (WELLBUTRIN XL) 150 MG 24 hr tablet Take 450 mg by mouth daily.   . metoprolol succinate (TOPROL XL) 25 MG 24 hr tablet Take 1 tablet (25 mg total) by mouth daily.  Marland Kitchen omeprazole (PRILOSEC OTC) 20 MG tablet Take 20 mg by mouth daily.   . Tamsulosin HCl (FLOMAX) 0.4 MG CAPS Take 0.4 mg by mouth daily.   . [EXPIRED] influenza vaccine adjuvanted (FLUAD) injection 0.5 mL    No facility-administered encounter medications on file as of 05/01/2019.    ALLERGIES:  Allergies  Allergen Reactions  . Penicillins Other (See Comments)    Mother told him throat swelled up when he took at a young age  Did it involve swelling of the face/tongue/throat, SOB, or low BP? Yes Did it involve sudden or severe rash/hives, skin peeling, or any reaction on the inside of your mouth or nose? Unknown Did you need to seek medical attention at a hospital or doctor's office? Yes When did it last happen?Childhood reaction If all above answers are "NO", may proceed with cephalosporin use.      PHYSICAL EXAM:  ECOG Performance status: 1  Vitals:   05/01/19 0906  BP: (!) 101/51  Pulse: 83  Resp: 18  Temp: (!) 97.4 F (36.3 C)  SpO2: 100%   Filed Weights   05/01/19 0906  Weight: 228 lb 1.6 oz (103.5 kg)    Physical Exam Constitutional:      Appearance: Normal appearance. He is normal weight.  Cardiovascular:     Rate and Rhythm: Normal  rate and regular rhythm.     Heart sounds: Normal heart sounds.  Pulmonary:     Effort: Pulmonary effort is normal.     Breath sounds: Normal breath sounds.  Abdominal:     General: Bowel sounds are normal.     Palpations: Abdomen is soft.  Musculoskeletal:        General: Normal range of motion.  Skin:    General: Skin is warm.  Neurological:     Mental Status: He is alert and oriented to person, place,  and time. Mental status is at baseline.  Psychiatric:        Mood and Affect: Mood normal.        Behavior: Behavior normal.        Thought Content: Thought content normal.        Judgment: Judgment normal.      LABORATORY DATA:  I have reviewed the labs as listed.  CBC    Component Value Date/Time   WBC 7.0 04/24/2019 1124   RBC 6.12 (H) 04/24/2019 1124   HGB 18.2 (H) 04/24/2019 1124   HCT 55.9 (H) 04/24/2019 1124   PLT 187 04/24/2019 1124   MCV 91.3 04/24/2019 1124   MCH 29.7 04/24/2019 1124   MCHC 32.6 04/24/2019 1124   RDW 13.8 04/24/2019 1124   LYMPHSABS 2.1 04/24/2019 1124   MONOABS 0.5 04/24/2019 1124   EOSABS 0.1 04/24/2019 1124   BASOSABS 0.0 04/24/2019 1124   CMP Latest Ref Rng & Units 04/24/2019 10/06/2018 06/06/2018  Glucose 70 - 99 mg/dL 97 103(H) 101(H)  BUN 8 - 23 mg/dL _0 Creatinine 0.61 - 1.24 mg/dL 1.08 1.11 1.02  Sodium 135 - 145 mmol/L 137 136 138  Potassium 3.5 - 5.1 mmol/L 5.2(H) 4.3 4.6  Chloride 98 - 111 mmol/L 102 101 105  CO2 22 - 32 mmol/L _1 Calcium 8.9 - 10.3 mg/dL 9.2 8.8(L) 9.5  Total Protein 6.5 - 8.1 g/dL 6.9 6.8 7.0  Total Bilirubin 0.3 - 1.2 mg/dL 1.0 0.9 0.5  Alkaline Phos 38 - 126 U/L 66 68 64  AST 15 - 41 U/L _2 ALT 0 - 44 U/L _3 I personally performed a face-to-face visit.  All questions were answered to patient's stated satisfaction. Encouraged patient to call with any new concerns or questions before his next visit to the cancer center and we can certain see him sooner, if needed.      ASSESSMENT & PLAN:   Polycythemia vera ([place]) 1.  Secondary polycythemia: -Jak 2 V6 17 F and reflex testing was negative. -Initially thought to be secondary to testosterone supplements.  He has been off testosterone since July 2018 however he continues to have secondary polycythemia. -Patient uses his CPAP every night to sleep. -Last phlebotomy was in December 2019. -He does not have any vasomotor symptoms.  No aquagenic pruritus.  No erythromelalgia's. -BCR/ABL by PCR showed trace positivity for B2 A2 transcript.  We will consider doing fish testing at some point. -Bone marrow biopsy in 2018 showed slightly hypercellular bone marrow for age with trilineage hematopoiesis.  No definite morphological evidence of myeloproliferative disorder. -Labs done on 04/24/2019 showed hemoglobin 18.2, hematocrit 55.9, WBC 7.0, platelets 187. -We will set him up with 1 phlebotomy now and 1 phlebotomy in 1 month -We will see him back in 5 months with repeat labs.      Orders placed this encounter:  Orders Placed This Encounter  Procedures  . Phlebotomy therapeutic  . Phlebotomy therapeutic  . Lactate dehydrogenase  . CBC with Differential/Platelet  . Comprehensive metabolic panel      Francene Finders, FNP-C [facility] [phone]

## 2019-05-01 NOTE — Assessment & Plan Note (Addendum)
1.  Secondary polycythemia: -Jak 2 V6 17 F and reflex testing was negative. -Initially thought to be secondary to testosterone supplements.  He has been off testosterone since July 2018 however he continues to have secondary polycythemia. -Patient uses his CPAP every night to sleep. -Last phlebotomy was in December 2019. -He does not have any vasomotor symptoms.  No aquagenic pruritus.  No erythromelalgia's. -BCR/ABL by PCR showed trace positivity for B2 A2 transcript.  We will consider doing fish testing at some point. -Bone marrow biopsy in 2018 showed slightly hypercellular bone marrow for age with trilineage hematopoiesis.  No definite morphological evidence of myeloproliferative disorder. -Labs done on 04/24/2019 showed hemoglobin 18.2, hematocrit 55.9, WBC 7.0, platelets 187. -We will set him up with 1 phlebotomy now and 1 phlebotomy in 1 month -We will see him back in 5 months with repeat labs.

## 2019-05-01 NOTE — Patient Instructions (Signed)
[facility] Discharge Instructions     Thank you for choosing [facility] at [facility] to provide your oncology and hematology care.  To afford each patient quality time with our provider, please arrive at least 15 minutes before your scheduled appointment time.   If you have a lab appointment with the Cancer Center please come in thru the Main Entrance and check in at the main information desk.  You need to re-schedule your appointment should you arrive 10 or more minutes late.  We strive to give you quality time with our providers, and arriving late affects you and other patients whose appointments are after yours.  Also, if you no show three or more times for appointments you may be dismissed from the clinic at the providers discretion.     Again, thank you for choosing [facility].  Our hope is that these requests will decrease the amount of time that you wait before being seen by our physicians.       _____________________________________________________________  Should you have questions after your visit to [facility], please contact our office at [phone] between the hours of 8:00 a.m. and 4:30 p.m.  Voicemails left after 4:00 p.m. will not be returned until the following business day.  For prescription refill requests, have your pharmacy contact our office and allow 72 hours.    Due to Covid, you will need to wear a mask upon entering the hospital. If you do not have a mask, a mask will be given to you at the Main Entrance upon arrival. For doctor visits, patients may have 1 support person with them. For treatment visits, patients can not have anyone with them due to social distancing guidelines and our immunocompromised population.      

## 2019-05-02 ENCOUNTER — Encounter (INDEPENDENT_AMBULATORY_CARE_PROVIDER_SITE_OTHER): Payer: Self-pay

## 2019-05-02 ENCOUNTER — Other Ambulatory Visit: Payer: Self-pay

## 2019-05-02 ENCOUNTER — Inpatient Hospital Stay (HOSPITAL_COMMUNITY): Payer: PPO

## 2019-05-02 NOTE — Progress Notes (Signed)
1430 After 2 unsuccessful peripheral IV sticks for phlebotomy today,pt rescheduled for next week and encouraged to increase PO fluid intake.

## 2019-05-08 ENCOUNTER — Encounter: Payer: Self-pay | Admitting: Cardiology

## 2019-05-08 NOTE — Progress Notes (Signed)
Virtual Visit via Telephone Note   This visit type was conducted due to national recommendations for restrictions regarding the COVID-19 Pandemic (e.g. social distancing) in an effort to limit this patient's exposure and mitigate transmission in our community.  Due to his co-morbid illnesses, this patient is at least at moderate risk for complications without adequate follow up.  This format is felt to be most appropriate for this patient at this time.  The patient did not have access to video technology/had technical difficulties with video requiring transitioning to audio format only (telephone).  All issues noted in this document were discussed and addressed.  No physical exam could be performed with this format.  Please refer to the patient's chart for his  consent to telehealth for [facility].   Date:  05/09/2019   ID:  [name], DOB [birth year], MRN [id number]  Patient Location: Home Provider Location: Office  PCP:  [name], MD  Cardiologist:  Rozann Lesches, MD Electrophysiologist:  None   Evaluation Performed:  Follow-Up Visit  Chief Complaint:  Cardiac follow-up  History of Present Illness:    Brandon Maddox is a 69 y.o. male last seen in March 2019.  We spoke by phone today.  He states that he has been doing reasonably well. 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  He reports fatigue, no progressive sense of palpitations or chest pain.  CHA2DS2-VASc score is 1.  He remains on low-dose aspirin along with Toprol-XL.  Still following with Dr. Willey Blade.  He has polycythemia and is followed in the oncology clinic with intermittent phlebotomies.  Lab work from earlier in the month is outlined below.  The patient does not have symptoms concerning for COVID-19 infection (fever, chills, cough, or new shortness of breath).    Past Medical History:  Diagnosis  Date  . Atrial fibrillation ([place])   . Closed right scapular fracture   . Depression   . GERD (gastroesophageal reflux disease)   . Gout   . Low testosterone   . 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 Ribs, multiple fractures    Past Surgical History:  Procedure Laterality Date  . COLONOSCOPY WITH PROPOFOL N/A 06/01/2018   Procedure: COLONOSCOPY WITH PROPOFOL;  Surgeon: Daneil Dolin, MD;  [facility];  Service: Endoscopy;  Laterality: N/A;  10:45am  . HIP SURGERY Right 2010   Dr. Dennis Bast and then replacement  . LACERATION REPAIR  05/03/2012   Procedure: REPAIR MULTIPLE LACERATIONS;  Surgeon: Jerrell Belfast, MD;  Location: [place];  Service: ENT;  Laterality: N/A;  . POLYPECTOMY  06/01/2018   Procedure: POLYPECTOMY;  Surgeon: Daneil Dolin, MD;  [facility];  Service: Endoscopy;;  cecum,hepatic flexure  . 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 allopurinol (ZYLOPRIM) 300 MG tablet Take 300 mg by mouth daily.   Marland Kitchen aspirin EC 81 MG tablet Take 81 mg by mouth daily.  Marland Kitchen buPROPion (WELLBUTRIN XL) 150 MG 24 hr tablet Take 450 mg by mouth daily.   . metoprolol succinate (TOPROL XL) 25 MG 24 hr tablet Take 1 tablet (25 mg total) by mouth daily.  Marland Kitchen omeprazole (PRILOSEC OTC) 20 MG tablet Take 20 mg by mouth daily.   . Tamsulosin HCl (FLOMAX) 0.4 MG CAPS Take 0.4 mg by mouth  daily.      Allergies:   Penicillins   Social History   Tobacco Use  . Smoking status: Former Smoker    Packs/day: 1.00    Years: 10.00    Pack years: 10.00    Types: Cigarettes    Quit date: 04/20/2003    Years since quitting: 16.0  . Smokeless tobacco: Never Used  Substance Use Topics  . Alcohol use: No    Alcohol/week: 0.0 standard drinks    Comment: History of alcohol abuse in the past; none in 5 years  . Drug use: No     Family Hx: The patient's family history includes Diabetes in his father and mother. There is no history of Colon cancer.  ROS:    Please see the history of present illness. All other systems reviewed and are negative.   Prior CV studies:   The following studies were reviewed today:  Echocardiogram 12/16/2015: Study Conclusions  - Left ventricle: The cavity size was normal. Wall thickness was increased in a pattern of mild LVH. Systolic function was normal. The estimated ejection fraction was in the range of 55% to 60%. Wall motion was normal; there were no regional wall motion abnormalities. The study is not technically sufficient to allow evaluation of LV diastolic function.  Labs/Other Tests and Data Reviewed:    EKG:  An ECG dated 06/30/2017 was personally reviewed today and demonstrated:  Rate controlled atrial fibrillation with nonspecific T wave changes.  Recent Labs: 04/24/2019: ALT 22; BUN 20; Creatinine, Ser 1.08; Hemoglobin 18.2; Platelets 187; Potassium 5.2; Sodium 137    Wt Readings from Last 3 Encounters:  05/09/19 228 lb (103.4 kg)  05/01/19 228 lb 1.6 oz (103.5 kg)  10/16/18 226 lb 4 oz (102.6 kg)     Objective:    Vital Signs:  BP 121/80   Pulse 89   Ht 5\' 11"  (1.803 m)   Wt 228 lb (103.4 kg)   BMI 31.80 kg/m    Patient spoke in full sentences, not short of breath. No audible wheezing or coughing. Speech pattern normal.  ASSESSMENT & PLAN:    Permanent atrial fibrillation.  Low thromboembolic risk with 99991111 or of 1.  He remains on low-dose aspirin and also Toprol-XL.  Requesting interval lab work from Dr. Willey Blade.  Tolerating rhythm based on discussion over time.  We will plan a follow-up ECG for next visit.  COVID-19 Education: The signs and symptoms of COVID-19 were discussed with the patient and how to seek care for testing (follow up with PCP or arrange E-visit).  The importance of social distancing was discussed today.  Time:   Today, I have spent 6 minutes with the patient with telehealth technology discussing the above problems.     Medication  Adjustments/Labs and Tests Ordered: Current medicines are reviewed at length with the patient today.  Concerns regarding medicines are outlined above.   Tests Ordered: No orders of the defined types were placed in this encounter.   Medication Changes: No orders of the defined types were placed in this encounter.   Follow Up:  In Person 1 year in the [place] office.  Signed, Rozann Lesches, MD  05/09/2019 8:24 AM    [place]

## 2019-05-09 ENCOUNTER — Encounter: Payer: Self-pay | Admitting: *Deleted

## 2019-05-09 ENCOUNTER — Telehealth (INDEPENDENT_AMBULATORY_CARE_PROVIDER_SITE_OTHER): Payer: PPO | Admitting: Cardiology

## 2019-05-09 ENCOUNTER — Encounter: Payer: Self-pay | Admitting: Cardiology

## 2019-05-09 VITALS — BP 121/80 | HR 89 | Ht 71.0 in | Wt 228.0 lb

## 2019-05-09 DIAGNOSIS — I4821 Permanent atrial fibrillation: Secondary | ICD-10-CM | POA: Diagnosis not present

## 2019-05-09 NOTE — Patient Instructions (Addendum)
Medication Instructions:   Your physician recommends that you continue on your current medications as directed. Please refer to the Current Medication list given to you today.  Labwork:  NONE  Testing/Procedures:  NONE  Follow-Up:  Your physician recommends that you schedule a follow-up appointment in: 1 year ([place] office). You will receive a reminder letter in the mail in about 10 months reminding you to call and schedule your appointment. If you don't receive this letter, please contact our office.  Any Other Special Instructions Will Be Listed Below (If Applicable).  If you need a refill on your cardiac medications before your next appointment, please call your pharmacy.

## 2019-05-10 ENCOUNTER — Encounter (HOSPITAL_COMMUNITY): Payer: PPO

## 2019-05-11 ENCOUNTER — Other Ambulatory Visit: Payer: Self-pay

## 2019-05-11 ENCOUNTER — Inpatient Hospital Stay (HOSPITAL_COMMUNITY): Payer: PPO

## 2019-05-11 DIAGNOSIS — D751 Secondary polycythemia: Secondary | ICD-10-CM | POA: Diagnosis not present

## 2019-05-11 NOTE — Progress Notes (Signed)
Brandon Maddox presents today for phlebotomy per MD orders. Phlebotomy procedure started at 08:15 and ended at 08:35. 500 cc removed. Patient tolerated procedure well. IV needle removed intact. Pressure applied.   No complaints at this time. Discharged from clinic ambulatory. F/U with [facility] as scheduled.

## 2019-05-11 NOTE — Patient Instructions (Signed)
[facility]  Discharge Instructions:   _______________________________________________________________  Thank you for choosing [facility] at [facility] to provide your oncology and hematology care.  To afford each patient quality time with our providers, please arrive at least 15 minutes before your scheduled appointment.  You need to re-schedule your appointment if you arrive 10 or more minutes late.  We strive to give you quality time with our providers, and arriving late affects you and other patients whose appointments are after yours.  Also, if you no show three or more times for appointments you may be dismissed from the clinic.  Again, thank you for choosing [facility] at [facility]. Our hope is that these requests will allow you access to exceptional care and in a timely manner. _______________________________________________________________  If you have questions after your visit, please contact our office at [phone] between the hours of 8:30 a.m. and 5:00 p.m. Voicemails left after 4:30 p.m. will not be returned until the following business day. _______________________________________________________________  For prescription refill requests, have your pharmacy contact our office. _______________________________________________________________  Recommendations made by the consultant and any test results will be sent to your referring physician. _______________________________________________________________ 

## 2019-05-31 ENCOUNTER — Encounter (HOSPITAL_COMMUNITY): Payer: PPO

## 2019-06-15 ENCOUNTER — Inpatient Hospital Stay (HOSPITAL_COMMUNITY): Payer: PPO | Attending: Hematology

## 2019-06-15 ENCOUNTER — Other Ambulatory Visit: Payer: Self-pay

## 2019-06-15 ENCOUNTER — Encounter (HOSPITAL_COMMUNITY): Payer: Self-pay

## 2019-06-15 DIAGNOSIS — D751 Secondary polycythemia: Secondary | ICD-10-CM | POA: Insufficient documentation

## 2019-06-15 NOTE — Patient Instructions (Signed)
[facility]  Discharge Instructions:   _______________________________________________________________  Thank you for choosing [facility] at [facility] to provide your oncology and hematology care.  To afford each patient quality time with our providers, please arrive at least 15 minutes before your scheduled appointment.  You need to re-schedule your appointment if you arrive 10 or more minutes late.  We strive to give you quality time with our providers, and arriving late affects you and other patients whose appointments are after yours.  Also, if you no show three or more times for appointments you may be dismissed from the clinic.  Again, thank you for choosing [facility] at [facility]. Our hope is that these requests will allow you access to exceptional care and in a timely manner. _______________________________________________________________  If you have questions after your visit, please contact our office at [phone] between the hours of 8:30 a.m. and 5:00 p.m. Voicemails left after 4:30 p.m. will not be returned until the following business day. _______________________________________________________________  For prescription refill requests, have your pharmacy contact our office. _______________________________________________________________  Recommendations made by the consultant and any test results will be sent to your referring physician. _______________________________________________________________ 

## 2019-06-15 NOTE — Progress Notes (Signed)
Brandon Maddox presents today for phlebotomy per MD orders. Phlebotomy procedure started at 11:33  and ended at 11:53.  500 cc removed. Patient tolerated procedure well. IV needle removed intact. HGB 18.2 04/24/19 Phlebotomy given today per MD orders. Tolerated without adverse affects. Vital signs stable. No complaints at this time. Discharged from clinic ambulatory. F/U with [facility] as scheduled.

## 2019-09-25 ENCOUNTER — Inpatient Hospital Stay (HOSPITAL_COMMUNITY): Payer: PPO | Attending: Hematology

## 2019-09-25 DIAGNOSIS — K219 Gastro-esophageal reflux disease without esophagitis: Secondary | ICD-10-CM | POA: Insufficient documentation

## 2019-09-25 DIAGNOSIS — Z7982 Long term (current) use of aspirin: Secondary | ICD-10-CM | POA: Diagnosis not present

## 2019-09-25 DIAGNOSIS — Z87891 Personal history of nicotine dependence: Secondary | ICD-10-CM | POA: Insufficient documentation

## 2019-09-25 DIAGNOSIS — I4891 Unspecified atrial fibrillation: Secondary | ICD-10-CM | POA: Insufficient documentation

## 2019-09-25 DIAGNOSIS — Z79899 Other long term (current) drug therapy: Secondary | ICD-10-CM | POA: Diagnosis not present

## 2019-09-25 DIAGNOSIS — D45 Polycythemia vera: Secondary | ICD-10-CM

## 2019-09-25 DIAGNOSIS — D751 Secondary polycythemia: Secondary | ICD-10-CM | POA: Diagnosis not present

## 2019-09-25 DIAGNOSIS — F329 Major depressive disorder, single episode, unspecified: Secondary | ICD-10-CM | POA: Diagnosis not present

## 2019-09-25 LAB — CBC WITH DIFFERENTIAL/PLATELET
Abs Immature Granulocytes: 0.03 10*3/uL (ref 0.00–0.07)
Basophils Absolute: 0.1 10*3/uL (ref 0.0–0.1)
Basophils Relative: 1 %
Eosinophils Absolute: 0.1 10*3/uL (ref 0.0–0.5)
Eosinophils Relative: 2 %
HCT: 50.2 % (ref 39.0–52.0)
Hemoglobin: 16 g/dL (ref 13.0–17.0)
Immature Granulocytes: 0 %
Lymphocytes Relative: 32 %
Lymphs Abs: 2.2 10*3/uL (ref 0.7–4.0)
MCH: 27.3 pg (ref 26.0–34.0)
MCHC: 31.9 g/dL (ref 30.0–36.0)
MCV: 85.7 fL (ref 80.0–100.0)
Monocytes Absolute: 0.4 10*3/uL (ref 0.1–1.0)
Monocytes Relative: 6 %
Neutro Abs: 4 10*3/uL (ref 1.7–7.7)
Neutrophils Relative %: 59 %
Platelets: 210 10*3/uL (ref 150–400)
RBC: 5.86 MIL/uL — ABNORMAL HIGH (ref 4.22–5.81)
RDW: 14.8 % (ref 11.5–15.5)
WBC: 6.8 10*3/uL (ref 4.0–10.5)
nRBC: 0 % (ref 0.0–0.2)

## 2019-09-25 LAB — COMPREHENSIVE METABOLIC PANEL
ALT: 19 U/L (ref 0–44)
AST: 16 U/L (ref 15–41)
Albumin: 3.9 g/dL (ref 3.5–5.0)
Alkaline Phosphatase: 78 U/L (ref 38–126)
Anion gap: 10 (ref 5–15)
BUN: 17 mg/dL (ref 8–23)
CO2: 24 mmol/L (ref 22–32)
Calcium: 9 mg/dL (ref 8.9–10.3)
Chloride: 103 mmol/L (ref 98–111)
Creatinine, Ser: 1.13 mg/dL (ref 0.61–1.24)
GFR calc Af Amer: >60 mL/min (ref >60)
GFR calc non Af Amer: >60 mL/min (ref >60)
Glucose, Bld: 140 mg/dL — ABNORMAL HIGH (ref 70–99)
Potassium: 4.1 mmol/L (ref 3.5–5.1)
Sodium: 137 mmol/L (ref 135–145)
Total Bilirubin: 0.7 mg/dL (ref 0.3–1.2)
Total Protein: 7 g/dL (ref 6.5–8.1)

## 2019-09-25 LAB — LACTATE DEHYDROGENASE: LDH: 113 U/L (ref 98–192)

## 2019-10-02 ENCOUNTER — Inpatient Hospital Stay (HOSPITAL_COMMUNITY): Payer: PPO | Admitting: Nurse Practitioner

## 2019-10-02 DIAGNOSIS — D751 Secondary polycythemia: Secondary | ICD-10-CM | POA: Diagnosis not present

## 2019-10-02 DIAGNOSIS — D45 Polycythemia vera: Secondary | ICD-10-CM

## 2019-10-02 NOTE — Progress Notes (Signed)
[place], [place] [address]   CLINIC:  Medical Oncology/Hematology  PCP:  [name], MD [address] [place] [address] [phone]   REASON FOR VISIT: Follow-up for secondary polycythemia   CURRENT THERAPY: Intermittent phlebotomies   INTERVAL HISTORY:  Brandon Maddox 69 y.o. male returns for routine follow-up for secondary polycythemia.  Patient reports he is doing well since his last visit.   He does report his energy levels are on the lower side.  He denies any aqua genic pruritus.  He denies any headaches or blurry vision.  Denies any history of blood clots. Denies any nausea, vomiting, or diarrhea. Denies any new pains. Had not noticed any recent bleeding such as epistaxis, hematuria or hematochezia. Denies recent chest pain on exertion, shortness of breath on minimal exertion, pre-syncopal episodes, or palpitations. Denies any numbness or tingling in hands or feet. Denies any recent fevers, infections, or recent hospitalizations. Patient reports appetite at 100% and energy level at 25%.  He is eating well maintain his weight at this time.     REVIEW OF SYSTEMS:  Review of Systems  Constitutional: Positive for fatigue.  All other systems reviewed and are negative.    PAST MEDICAL/SURGICAL HISTORY:  Past Medical History:  Diagnosis Date  . Atrial fibrillation ([place])   . Closed right scapular fracture   . Depression   . GERD (gastroesophageal reflux disease)   . Gout   . Low testosterone   . 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 Ribs, multiple fractures    Past Surgical History:  Procedure Laterality Date  . COLONOSCOPY WITH PROPOFOL N/A 06/01/2018   Procedure: COLONOSCOPY WITH PROPOFOL;  Surgeon: Daneil Dolin, MD;  [facility];  Service: Endoscopy;  Laterality: N/A;  10:45am  . HIP SURGERY Right 2010   Dr. Dennis Bast and then replacement  . LACERATION REPAIR  05/03/2012   Procedure: REPAIR MULTIPLE  LACERATIONS;  Surgeon: Jerrell Belfast, MD;  Location: [facility];  Service: ENT;  Laterality: N/A;  . POLYPECTOMY  06/01/2018   Procedure: POLYPECTOMY;  Surgeon: Daneil Dolin, MD;  [facility];  Service: Endoscopy;;  cecum,hepatic flexure  . 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 Marital status: Divorced    Spouse name: Not on file  . Number of children: 1  . Years of education: Not on file  . Highest education level: Not on file  Occupational History    Employer: SELF EMPLOYED  Tobacco Use  . Smoking status: Former Smoker    Packs/day: 1.00    Years: 10.00    Pack years: 10.00    Types: Cigarettes    Quit date: 04/20/2003    Years since quitting: 16.4  . Smokeless tobacco: Never Used  Vaping Use  . Vaping Use: Never used  Substance and Sexual Activity  . Alcohol use: No    Alcohol/week: 0.0 standard drinks    Comment: History of alcohol abuse in the past; none in 5 years  . Drug use: No  . Sexual activity: Yes  Other Topics Concern  . Not on file  Social History Narrative  . Not on file   Social Determinants of Health   Financial Resource Strain:   . Difficulty of Paying Living Expenses:   Food Insecurity:   . Worried About Charity fundraiser in the Last Year:   .  Ran Out of Food in the Last Year:   Transportation Needs:   . Film/video editor (Medical):   Marland Kitchen Lack of Transportation (Non-Medical):   Physical Activity:   . Days of Exercise per Week:   . Minutes of Exercise per Session:   Stress:   . Feeling of Stress :   Social Connections:   . Frequency of Communication with Friends and Family:   . Frequency of Social Gatherings with Friends and Family:   . Attends Religious Services:   . Active Member of Clubs or Organizations:   . Attends Archivist Meetings:   Marland Kitchen Marital Status:   Intimate Partner Violence:   . Fear of Current or Ex-Partner:   . Emotionally Abused:   Marland Kitchen  Physically Abused:   . Sexually Abused:     FAMILY HISTORY:  Family History  Problem Relation Age of Onset  . Diabetes Mother   . Diabetes Father   . Colon cancer Neg Hx     CURRENT MEDICATIONS:  Outpatient Encounter Medications as of 10/02/2019  Medication Sig  . allopurinol (ZYLOPRIM) 300 MG tablet Take 300 mg by mouth daily.   Marland Kitchen aspirin EC 81 MG tablet Take 81 mg by mouth daily.  Marland Kitchen buPROPion (WELLBUTRIN XL) 150 MG 24 hr tablet Take 450 mg by mouth daily.   . metoprolol succinate (TOPROL XL) 25 MG 24 hr tablet Take 1 tablet (25 mg total) by mouth daily.  Marland Kitchen omeprazole (PRILOSEC OTC) 20 MG tablet Take 20 mg by mouth daily.   . Tamsulosin HCl (FLOMAX) 0.4 MG CAPS Take 0.4 mg by mouth daily.    No facility-administered encounter medications on file as of 10/02/2019.    ALLERGIES:  Allergies  Allergen Reactions  . Penicillins Other (See Comments)    Mother told him throat swelled up when he took at a young age  Did it involve swelling of the face/tongue/throat, SOB, or low BP? Yes Did it involve sudden or severe rash/hives, skin peeling, or any reaction on the inside of your mouth or nose? Unknown Did you need to seek medical attention at a hospital or doctor's office? Yes When did it last happen?Childhood reaction If all above answers are "NO", may proceed with cephalosporin use.      PHYSICAL EXAM:  ECOG Performance status: 1  Vitals:   10/02/19 1405  BP: 102/73  Pulse: 89  Resp: 18  Temp: 98.6 F (37 C)  SpO2: 100%   Filed Weights   10/02/19 1405  Weight: 224 lb 12.8 oz (102 kg)   Physical Exam Constitutional:      Appearance: Normal appearance. He is normal weight.  Cardiovascular:     Rate and Rhythm: Normal rate and regular rhythm.     Heart sounds: Normal heart sounds.  Pulmonary:     Effort: Pulmonary effort is normal.     Breath sounds: Normal breath sounds.  Abdominal:     General: Bowel sounds are normal.     Palpations: Abdomen is soft.    Musculoskeletal:        General: Normal range of motion.  Skin:    General: Skin is warm.  Neurological:     Mental Status: He is alert and oriented to person, place, and time. Mental status is at baseline.  Psychiatric:        Mood and Affect: Mood normal.        Behavior: Behavior normal.        Thought Content: Thought content  normal.        Judgment: Judgment normal.      LABORATORY DATA:  I have reviewed the labs as listed.  CBC    Component Value Date/Time   WBC 6.8 09/25/2019 1314   RBC 5.86 (H) 09/25/2019 1314   HGB 16.0 09/25/2019 1314   HCT 50.2 09/25/2019 1314   PLT 210 09/25/2019 1314   MCV 85.7 09/25/2019 1314   MCH 27.3 09/25/2019 1314   MCHC 31.9 09/25/2019 1314   RDW 14.8 09/25/2019 1314   LYMPHSABS 2.2 09/25/2019 1314   MONOABS 0.4 09/25/2019 1314   EOSABS 0.1 09/25/2019 1314   BASOSABS 0.1 09/25/2019 1314   CMP Latest Ref Rng & Units 09/25/2019 04/24/2019 10/06/2018  Glucose 70 - 99 mg/dL 140(H) 97 103(H)  BUN 8 - 23 mg/dL 17 20 16   Creatinine 0.61 - 1.24 mg/dL 1.13 1.08 1.11  Sodium 135 - 145 mmol/L 137 137 136  Potassium 3.5 - 5.1 mmol/L 4.1 5.2(H) 4.3  Chloride 98 - 111 mmol/L 103 102 101  CO2 22 - 32 mmol/L 24 28 23   Calcium 8.9 - 10.3 mg/dL 9.0 9.2 8.8(L)  Total Protein 6.5 - 8.1 g/dL 7.0 6.9 6.8  Total Bilirubin 0.3 - 1.2 mg/dL 0.7 1.0 0.9  Alkaline Phos 38 - 126 U/L 78 66 68  AST 15 - 41 U/L 16 18 18   ALT 0 - 44 U/L 19 22 20     All questions were answered to patient's stated satisfaction. Encouraged patient to call with any new concerns or questions before his next visit to the cancer center and we can certain see him sooner, if needed.     ASSESSMENT & PLAN:  Polycythemia vera ([place]) 1.  Secondary polycythemia: -Jak 2 V6 17 F and reflex testing was negative. -Initially thought to be secondary to testosterone supplements.  He has been off testosterone since July 2018 however he continues to have secondary polycythemia. -Patient uses his  CPAP every night to sleep. -Last phlebotomy was in December 2019. -He does not have any vasomotor symptoms.  No aquagenic pruritus.  No erythromelalgia's. -BCR/ABL by PCR showed trace positivity for B2 A2 transcript.  We will consider doing fish testing at some point. -Bone marrow biopsy in 2018 showed slightly hypercellular bone marrow for age with trilineage hematopoiesis.  No definite morphological evidence of myeloproliferative disorder. -Labs done on 09/25/2019 showed hemoglobin 16.0, hematocrit 50.2, WBC 6.8, platelets 210. -We will see him back in 4 months with repeat labs.     Orders placed this encounter:  Orders Placed This Encounter  Procedures  . Lactate dehydrogenase  . CBC with Differential/Platelet  . Comprehensive metabolic panel  . Vitamin B12  . VITAMIN D 25 Hydroxy (Vit-D Deficiency, Fractures)     Francene Finders, FNP-C [place] [phone]

## 2019-10-02 NOTE — Patient Instructions (Signed)
[facility] at [facility] Discharge Instructions  Follow up in 4 months with labs    Thank you for choosing [facility] at [facility] to provide your oncology and hematology care.  To afford each patient quality time with our provider, please arrive at least 15 minutes before your scheduled appointment time.   If you have a lab appointment with the Cancer Center please come in thru the Main Entrance and check in at the main information desk.  You need to re-schedule your appointment should you arrive 10 or more minutes late.  We strive to give you quality time with our providers, and arriving late affects you and other patients whose appointments are after yours.  Also, if you no show three or more times for appointments you may be dismissed from the clinic at the providers discretion.     Again, thank you for choosing [facility].  Our hope is that these requests will decrease the amount of time that you wait before being seen by our physicians.       _____________________________________________________________  Should you have questions after your visit to [facility], please contact our office at [phone] between the hours of 8:00 a.m. and 4:30 p.m.  Voicemails left after 4:00 p.m. will not be returned until the following business day.  For prescription refill requests, have your pharmacy contact our office and allow 72 hours.    Due to Covid, you will need to wear a mask upon entering the hospital. If you do not have a mask, a mask will be given to you at the Main Entrance upon arrival. For doctor visits, patients may have 1 support person with them. For treatment visits, patients can not have anyone with them due to social distancing guidelines and our immunocompromised population.      

## 2019-10-02 NOTE — Assessment & Plan Note (Signed)
1.  Secondary polycythemia: -Jak 2 V6 17 F and reflex testing was negative. -Initially thought to be secondary to testosterone supplements.  He has been off testosterone since July 2018 however he continues to have secondary polycythemia. -Patient uses his CPAP every night to sleep. -Last phlebotomy was in December 2019. -He does not have any vasomotor symptoms.  No aquagenic pruritus.  No erythromelalgia's. -BCR/ABL by PCR showed trace positivity for B2 A2 transcript.  We will consider doing fish testing at some point. -Bone marrow biopsy in 2018 showed slightly hypercellular bone marrow for age with trilineage hematopoiesis.  No definite morphological evidence of myeloproliferative disorder. -Labs done on 09/25/2019 showed hemoglobin 16.0, hematocrit 50.2, WBC 6.8, platelets 210. -We will see him back in 4 months with repeat labs.

## 2019-11-27 DIAGNOSIS — Z125 Encounter for screening for malignant neoplasm of prostate: Secondary | ICD-10-CM | POA: Diagnosis not present

## 2019-11-27 DIAGNOSIS — E119 Type 2 diabetes mellitus without complications: Secondary | ICD-10-CM | POA: Diagnosis not present

## 2019-11-27 DIAGNOSIS — D751 Secondary polycythemia: Secondary | ICD-10-CM | POA: Diagnosis not present

## 2019-11-27 DIAGNOSIS — E785 Hyperlipidemia, unspecified: Secondary | ICD-10-CM | POA: Diagnosis not present

## 2019-11-27 DIAGNOSIS — F329 Major depressive disorder, single episode, unspecified: Secondary | ICD-10-CM | POA: Diagnosis not present

## 2019-11-27 DIAGNOSIS — G4733 Obstructive sleep apnea (adult) (pediatric): Secondary | ICD-10-CM | POA: Diagnosis not present

## 2019-11-27 DIAGNOSIS — Z79899 Other long term (current) drug therapy: Secondary | ICD-10-CM | POA: Diagnosis not present

## 2019-11-27 DIAGNOSIS — M109 Gout, unspecified: Secondary | ICD-10-CM | POA: Diagnosis not present

## 2019-12-04 DIAGNOSIS — R945 Abnormal results of liver function studies: Secondary | ICD-10-CM | POA: Diagnosis not present

## 2019-12-04 DIAGNOSIS — E1169 Type 2 diabetes mellitus with other specified complication: Secondary | ICD-10-CM | POA: Diagnosis not present

## 2019-12-04 DIAGNOSIS — I4821 Permanent atrial fibrillation: Secondary | ICD-10-CM | POA: Diagnosis not present

## 2019-12-11 ENCOUNTER — Other Ambulatory Visit: Payer: Self-pay

## 2020-01-11 ENCOUNTER — Ambulatory Visit: Payer: PPO | Admitting: Urology

## 2020-01-28 ENCOUNTER — Other Ambulatory Visit (HOSPITAL_COMMUNITY): Payer: Self-pay

## 2020-01-28 DIAGNOSIS — D45 Polycythemia vera: Secondary | ICD-10-CM

## 2020-01-28 DIAGNOSIS — D751 Secondary polycythemia: Secondary | ICD-10-CM

## 2020-01-29 ENCOUNTER — Other Ambulatory Visit: Payer: Self-pay

## 2020-01-29 ENCOUNTER — Inpatient Hospital Stay (HOSPITAL_COMMUNITY): Payer: PPO | Attending: Hematology

## 2020-01-29 DIAGNOSIS — R5383 Other fatigue: Secondary | ICD-10-CM | POA: Diagnosis not present

## 2020-01-29 DIAGNOSIS — Z23 Encounter for immunization: Secondary | ICD-10-CM | POA: Diagnosis not present

## 2020-01-29 DIAGNOSIS — R002 Palpitations: Secondary | ICD-10-CM | POA: Diagnosis not present

## 2020-01-29 DIAGNOSIS — D45 Polycythemia vera: Secondary | ICD-10-CM | POA: Insufficient documentation

## 2020-01-29 DIAGNOSIS — Z833 Family history of diabetes mellitus: Secondary | ICD-10-CM | POA: Insufficient documentation

## 2020-01-29 DIAGNOSIS — E559 Vitamin D deficiency, unspecified: Secondary | ICD-10-CM | POA: Insufficient documentation

## 2020-01-29 DIAGNOSIS — I4891 Unspecified atrial fibrillation: Secondary | ICD-10-CM | POA: Diagnosis not present

## 2020-01-29 DIAGNOSIS — E669 Obesity, unspecified: Secondary | ICD-10-CM | POA: Diagnosis not present

## 2020-01-29 DIAGNOSIS — Z79899 Other long term (current) drug therapy: Secondary | ICD-10-CM | POA: Insufficient documentation

## 2020-01-29 DIAGNOSIS — Z87891 Personal history of nicotine dependence: Secondary | ICD-10-CM | POA: Insufficient documentation

## 2020-01-29 DIAGNOSIS — R42 Dizziness and giddiness: Secondary | ICD-10-CM | POA: Insufficient documentation

## 2020-01-29 LAB — CBC WITH DIFFERENTIAL/PLATELET
Abs Immature Granulocytes: 0.05 10*3/uL (ref 0.00–0.07)
Basophils Absolute: 0.1 10*3/uL (ref 0.0–0.1)
Basophils Relative: 1 %
Eosinophils Absolute: 0.1 10*3/uL (ref 0.0–0.5)
Eosinophils Relative: 2 %
HCT: 52.4 % — ABNORMAL HIGH (ref 39.0–52.0)
Hemoglobin: 16.8 g/dL (ref 13.0–17.0)
Immature Granulocytes: 1 %
Lymphocytes Relative: 29 %
Lymphs Abs: 2 10*3/uL (ref 0.7–4.0)
MCH: 27.3 pg (ref 26.0–34.0)
MCHC: 32.1 g/dL (ref 30.0–36.0)
MCV: 85.2 fL (ref 80.0–100.0)
Monocytes Absolute: 0.5 10*3/uL (ref 0.1–1.0)
Monocytes Relative: 7 %
Neutro Abs: 4.2 10*3/uL (ref 1.7–7.7)
Neutrophils Relative %: 60 %
Platelets: 225 10*3/uL (ref 150–400)
RBC: 6.15 MIL/uL — ABNORMAL HIGH (ref 4.22–5.81)
RDW: 15.7 % — ABNORMAL HIGH (ref 11.5–15.5)
WBC: 6.9 10*3/uL (ref 4.0–10.5)
nRBC: 0 % (ref 0.0–0.2)

## 2020-01-29 LAB — COMPREHENSIVE METABOLIC PANEL
ALT: 18 U/L (ref 0–44)
AST: 16 U/L (ref 15–41)
Albumin: 4.1 g/dL (ref 3.5–5.0)
Alkaline Phosphatase: 72 U/L (ref 38–126)
Anion gap: 8 (ref 5–15)
BUN: 19 mg/dL (ref 8–23)
CO2: 27 mmol/L (ref 22–32)
Calcium: 9.5 mg/dL (ref 8.9–10.3)
Chloride: 99 mmol/L (ref 98–111)
Creatinine, Ser: 1.14 mg/dL (ref 0.61–1.24)
GFR, Estimated: >60 mL/min (ref >60)
Glucose, Bld: 93 mg/dL (ref 70–99)
Potassium: 5.2 mmol/L — ABNORMAL HIGH (ref 3.5–5.1)
Sodium: 134 mmol/L — ABNORMAL LOW (ref 135–145)
Total Bilirubin: 0.9 mg/dL (ref 0.3–1.2)
Total Protein: 6.9 g/dL (ref 6.5–8.1)

## 2020-01-29 LAB — VITAMIN D 25 HYDROXY (VIT D DEFICIENCY, FRACTURES): Vit D, 25-Hydroxy: 26.14 ng/mL — ABNORMAL LOW (ref 30–100)

## 2020-01-29 LAB — VITAMIN B12: Vitamin B-12: 184 pg/mL (ref 180–914)

## 2020-01-29 LAB — LACTATE DEHYDROGENASE: LDH: 124 U/L (ref 98–192)

## 2020-02-05 ENCOUNTER — Ambulatory Visit (HOSPITAL_COMMUNITY): Payer: PPO | Admitting: Nurse Practitioner

## 2020-02-06 ENCOUNTER — Inpatient Hospital Stay (HOSPITAL_BASED_OUTPATIENT_CLINIC_OR_DEPARTMENT_OTHER): Payer: PPO | Admitting: Hematology

## 2020-02-06 VITALS — BP 131/56 | HR 89 | Temp 97.1°F | Resp 20 | Wt 227.2 lb

## 2020-02-06 DIAGNOSIS — D45 Polycythemia vera: Secondary | ICD-10-CM | POA: Diagnosis not present

## 2020-02-06 MED ORDER — INFLUENZA VAC A&B SA ADJ QUAD 0.5 ML IM PRSY
PREFILLED_SYRINGE | INTRAMUSCULAR | Status: AC
  Filled 2020-02-06: qty 0.5

## 2020-02-06 MED ORDER — INFLUENZA VAC A&B SA ADJ QUAD 0.5 ML IM PRSY
0.5000 mL | PREFILLED_SYRINGE | Freq: Once | INTRAMUSCULAR | Status: AC
  Administered 2020-02-06: 0.5 mL via INTRAMUSCULAR

## 2020-02-06 NOTE — Patient Instructions (Signed)
[place] at [facility] Discharge Instructions  You were seen today by Dr. Delton Coombes. He went over your recent results. You will be scheduled for a phlebotomy. Purchase vitamin D over the counter and take 1,000 units daily; purchase vitamin B12 over the counter and take 1 mg daily. Your next appointment will be with the nurse practitioner in 6 months with labs and follow up.   Thank you for choosing [place] at [facility] to provide your oncology and hematology care.  To afford each patient quality time with our provider, please arrive at least 15 minutes before your scheduled appointment time.   If you have a lab appointment with the [facility] please come in thru the Main Entrance and check in at the main information desk  You need to re-schedule your appointment should you arrive 10 or more minutes late.  We strive to give you quality time with our providers, and arriving late affects you and other patients whose appointments are after yours.  Also, if you no show three or more times for appointments you may be dismissed from the clinic at the providers discretion.     Again, thank you for choosing [facility].  Our hope is that these requests will decrease the amount of time that you wait before being seen by our physicians.       _____________________________________________________________  Should you have questions after your visit to [facility], please contact our office at [phone] between the hours of 8:00 a.m. and 4:30 p.m.  Voicemails left after 4:00 p.m. will not be returned until the following business day.  For prescription refill requests, have your pharmacy contact our office and allow 72 hours.    Cancer Center Support Programs:   > Cancer Support Group  2nd Tuesday of the month 1pm-2pm, Journey Room

## 2020-02-06 NOTE — Progress Notes (Signed)
[place], [place] [address]   CLINIC:  Medical Oncology/Hematology  PCP:  [name], MD [address] / [place] [address]  [phone]  REASON FOR VISIT:  Follow-up for polycythemia vera  PRIOR THERAPY: None  CURRENT THERAPY: Intermittent phlebotomies last on 06/15/2019  INTERVAL HISTORY:  Brandon Maddox, a 69 y.o. male, returns for routine follow-up for his polycythemia vera. Brandon Maddox was last seen on 10/16/2018.  Today he reports that his last phlebotomy in February 2021 and he denies doing any blood donations. He felt energetic after his last phlebotomy. He denies itching after a hot shower, changes in skin color of fingers, flushing, headaches or double vision, though he reports having some blurring in his vision.    REVIEW OF SYSTEMS:  Review of Systems  Constitutional: Positive for fatigue (75%). Negative for appetite change.  Eyes: Negative for eye problems.  Cardiovascular: Positive for palpitations (d/t a-fib).  Skin: Negative for itching.  Neurological: Positive for dizziness. Negative for headaches.  All other systems reviewed and are negative.   PAST MEDICAL/SURGICAL HISTORY:  Past Medical History:  Diagnosis Date  . Atrial fibrillation ([place])   . Closed right scapular fracture   . Depression   . GERD (gastroesophageal reflux disease)   . Gout   . Low testosterone   . 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 Ribs, multiple fractures    Past Surgical History:  Procedure Laterality Date  . COLONOSCOPY WITH PROPOFOL N/A 06/01/2018   Procedure: COLONOSCOPY WITH PROPOFOL;  Surgeon: Daneil Dolin, MD;  [facility];  Service: Endoscopy;  Laterality: N/A;  10:45am  . HIP SURGERY Right 2010   Dr. Dennis Bast and then replacement  . LACERATION REPAIR  05/03/2012   Procedure: REPAIR MULTIPLE LACERATIONS;  Surgeon: Jerrell Belfast, MD;  Location: [facility];  Service: ENT;  Laterality: N/A;  . POLYPECTOMY   06/01/2018   Procedure: POLYPECTOMY;  Surgeon: Daneil Dolin, MD;  [facility];  Service: Endoscopy;;  cecum,hepatic flexure  . Pyloric stenosis     32 weeks old    SOCIAL HISTORY:  Social History   Socioeconomic History  . Marital status: Divorced    Spouse name: Not on file  . Number of children: 1  . Years of education: Not on file  . Highest education level: Not on file  Occupational History    Employer: SELF EMPLOYED  Tobacco Use  . Smoking status: Former Smoker    Packs/day: 1.00    Years: 10.00    Pack years: 10.00    Types: Cigarettes    Quit date: 04/20/2003    Years since quitting: 16.8  . Smokeless tobacco: Never Used  Vaping Use  . Vaping Use: Never used  Substance and Sexual Activity  . Alcohol use: No    Alcohol/week: 0.0 standard drinks    Comment: History of alcohol abuse in the past; none in 5 years  . Drug use: No  . Sexual activity: Yes  Other Topics Concern  . Not on file  Social History Narrative  . Not on file   Social Determinants of Health   Financial Resource Strain:   . Difficulty of Paying Living Expenses: Not on file  Food Insecurity:   . Worried About Charity fundraiser in the Last Year: Not on file  . Ran Out of Food in the Last Year: Not on file  Transportation Needs:   . Lack of Transportation (  Medical): Not on file  . Lack of Transportation (Non-Medical): Not on file  Physical Activity:   . Days of Exercise per Week: Not on file  . Minutes of Exercise per Session: Not on file  Stress:   . Feeling of Stress : Not on file  Social Connections:   . Frequency of Communication with Friends and Family: Not on file  . Frequency of Social Gatherings with Friends and Family: Not on file  . Attends Religious Services: Not on file  . Active Member of Clubs or Organizations: Not on file  . Attends Archivist Meetings: Not on file  . Marital Status: Not on file  Intimate Partner Violence:   . Fear of Current or  Ex-Partner: Not on file  . Emotionally Abused: Not on file  . Physically Abused: Not on file  . Sexually Abused: Not on file    FAMILY HISTORY:  Family History  Problem Relation Age of Onset  . Diabetes Mother   . Diabetes Father   . Colon cancer Neg Hx     CURRENT MEDICATIONS:  Current Outpatient Medications  Medication Sig Dispense Refill  . allopurinol (ZYLOPRIM) 300 MG tablet Take 300 mg by mouth daily.     Marland Kitchen aspirin EC 81 MG tablet Take 81 mg by mouth daily.    Marland Kitchen buPROPion (WELLBUTRIN XL) 150 MG 24 hr tablet Take 450 mg by mouth daily.   4  . metoprolol succinate (TOPROL XL) 25 MG 24 hr tablet Take 1 tablet (25 mg total) by mouth daily. 90 tablet 3  . omeprazole (PRILOSEC OTC) 20 MG tablet Take 20 mg by mouth daily.     . Tamsulosin HCl (FLOMAX) 0.4 MG CAPS Take 0.4 mg by mouth daily.      Current Facility-Administered Medications  Medication Dose Route Frequency Provider Last Rate Last Admin  . influenza vaccine adjuvanted (FLUAD) injection 0.5 mL  0.5 mL Intramuscular Once Derek Jack, MD        ALLERGIES:  Allergies  Allergen Reactions  . Penicillins Other (See Comments)    Mother told him throat swelled up when he took at a young age  Did it involve swelling of the face/tongue/throat, SOB, or low BP? Yes Did it involve sudden or severe rash/hives, skin peeling, or any reaction on the inside of your mouth or nose? Unknown Did you need to seek medical attention at a hospital or doctor's office? Yes When did it last happen?Childhood reaction If all above answers are "NO", may proceed with cephalosporin use.     PHYSICAL EXAM:  Performance status (ECOG): 1 - Symptomatic but completely ambulatory  Vitals:   02/06/20 1440  BP: (!) 131/56  Pulse: 89  Resp: 20  Temp: (!) 97.1 F (36.2 C)  SpO2: 100%   Wt Readings from Last 3 Encounters:  02/06/20 227 lb 3.2 oz (103.1 kg)  10/02/19 224 lb 12.8 oz (102 kg)  05/09/19 228 lb (103.4 kg)   Physical  Exam Vitals reviewed.  Constitutional:      Appearance: Normal appearance. He is obese.  Cardiovascular:     Rate and Rhythm: Normal rate and regular rhythm.     Pulses: Normal pulses.     Heart sounds: Normal heart sounds.  Pulmonary:     Effort: Pulmonary effort is normal.     Breath sounds: Normal breath sounds.  Abdominal:     Palpations: Abdomen is soft. There is no hepatomegaly, splenomegaly or mass.     Tenderness: There  is no abdominal tenderness.     Hernia: No hernia is present.    Neurological:     General: No focal deficit present.     Mental Status: He is alert and oriented to person, place, and time.  Psychiatric:        Mood and Affect: Mood normal.        Behavior: Behavior normal.     LABORATORY DATA:  I have reviewed the labs as listed.  CBC Latest Ref Rng & Units 01/29/2020 09/25/2019 04/24/2019  WBC 4.0 - 10.5 K/uL 6.9 6.8 7.0  Hemoglobin 13.0 - 17.0 g/dL 16.8 16.0 18.2(H)  Hematocrit 39 - 52 % 52.4(H) 50.2 55.9(H)  Platelets 150 - 400 K/uL 225 210 187   CMP Latest Ref Rng & Units 01/29/2020 09/25/2019 04/24/2019  Glucose 70 - 99 mg/dL 93 140(H) 97  BUN 8 - 23 mg/dL _0 Creatinine 0.61 - 1.24 mg/dL 1.14 1.13 1.08  Sodium 135 - 145 mmol/L 134(L) 137 137  Potassium 3.5 - 5.1 mmol/L 5.2(H) 4.1 5.2(H)  Chloride 98 - 111 mmol/L 99 103 102  CO2 22 - 32 mmol/L _1 Calcium 8.9 - 10.3 mg/dL 9.5 9.0 9.2  Total Protein 6.5 - 8.1 g/dL 6.9 7.0 6.9  Total Bilirubin 0.3 - 1.2 mg/dL 0.9 0.7 1.0  Alkaline Phos 38 - 126 U/L 72 78 66  AST 15 - 41 U/L _2 ALT 0 - 44 U/L _3 Component Value Date/Time   RBC 6.15 (H) 01/29/2020 1335   MCV 85.2 01/29/2020 1335   MCH 27.3 01/29/2020 1335   MCHC 32.1 01/29/2020 1335   RDW 15.7 (H) 01/29/2020 1335   LYMPHSABS 2.0 01/29/2020 1335   MONOABS 0.5 01/29/2020 1335   EOSABS 0.1 01/29/2020 1335   BASOSABS 0.1 01/29/2020 1335   Lab Results  Component Value Date   LDH 124 01/29/2020   LDH 113  09/25/2019   LDH 133 04/24/2019   Lab Results  Component Value Date   VD25OH 26.14 (L) 01/29/2020    DIAGNOSTIC IMAGING:  I have independently reviewed the scans and discussed with the patient. No results found.   ASSESSMENT:  1.  Secondary polycythemia: -Jak 2 V6 19F and reflex testing was negative. - Initially thought to be secondary to testosterone supplements.  He has been off of testosterone since July 2018 however he continues to have secondary polycythemia. - Last phlebotomy was in December 2019. - He does not have any vasomotor symptoms.  No aquagenic pruritus.  No erythromelalgia's. - BCR/ABL by PCR showed trace positivity for B2 A2 transcript.  Will consider doing FISH testing at some point. -Bone marrow biopsy November 2018 showed slightly hypercellular bone marrow for age with trilineage hematopoiesis.  No definite morphological evidence of myeloproliferative disorder. -Last phlebotomy was in February 2021.   PLAN:  1.  Secondary polycythemia: -He felt better after the phlebotomy with improvement in energy levels. -CBC from 01/29/2020 was reviewed.  Hemoglobin gradually increasing to 16.8 and hematocrit of 52.4.  He does not have any vasomotor symptoms or aquagenic pruritus. -Recommend phlebotomy x1. -RTC 6 months with labs.  2.  Vitamin D deficiency: -Vitamin D is mildly low at 26.  Recommend vitamin D 1000 units daily.  Recheck in 6 months. -He also has borderline B12 levels at 184.  Recommend B12 1 mg tablet daily.  Recheck in 6 months.   Orders placed this encounter:  Orders Placed  This Encounter  Procedures  . CBC with Differential/Platelet  . Comprehensive metabolic panel  . Lactate dehydrogenase  . Vitamin B12  . VITAMIN D 25 Hydroxy (Vit-D Deficiency, Fractures)     Derek Jack, MD [place] [phone]   I, Milinda Antis, am acting as a scribe for Dr. Sanda Linger.  I, Derek Jack MD, have reviewed the  above documentation for accuracy and completeness, and I agree with the above.

## 2020-02-11 ENCOUNTER — Encounter: Payer: Self-pay | Admitting: Urology

## 2020-02-11 ENCOUNTER — Ambulatory Visit (INDEPENDENT_AMBULATORY_CARE_PROVIDER_SITE_OTHER): Payer: PPO | Admitting: Urology

## 2020-02-11 ENCOUNTER — Other Ambulatory Visit: Payer: Self-pay

## 2020-02-11 VITALS — BP 121/76 | HR 84 | Temp 98.8°F | Ht 71.0 in | Wt 227.2 lb

## 2020-02-11 DIAGNOSIS — R972 Elevated prostate specific antigen [PSA]: Secondary | ICD-10-CM | POA: Diagnosis not present

## 2020-02-11 LAB — URINALYSIS, ROUTINE W REFLEX MICROSCOPIC
Bilirubin, UA: NEGATIVE
Glucose, UA: NEGATIVE
Ketones, UA: NEGATIVE
Leukocytes,UA: NEGATIVE
Nitrite, UA: NEGATIVE
Protein,UA: NEGATIVE
RBC, UA: NEGATIVE
Specific Gravity, UA: 1.02 (ref 1.005–1.030)
Urobilinogen, Ur: 1 mg/dL (ref 0.2–1.0)
pH, UA: 7 (ref 5.0–7.5)

## 2020-02-11 MED ORDER — LEVOFLOXACIN 750 MG PO TABS: 750.0000 mg | ORAL_TABLET | Freq: Once | ORAL | 0 refills | Status: AC

## 2020-02-11 NOTE — Progress Notes (Signed)
02/11/2020 11:01 AM   [name] 1950/12/17 [id number]  Referring provider: [name], MD [address] [place],  [place] [address]  Elevated PSA  HPI: Mr Slinker is a 69yo here for evaluation of elevated PSA. PSA was around 3 2 years and has increased to 5.1 this year. No significant LUTS. He is flomax and has been for over 5 years. No family hx of prostate cancer. No dysuria or hematuria.    PMH: Past Medical History:  Diagnosis Date  . Acid reflux   . Atrial fibrillation ([place])   . Closed right scapular fracture   . Depression   . GERD (gastroesophageal reflux disease)   . Gout   . Low testosterone   . Polycythemia vera ([place]) 01/17/2017  . Ribs, multiple fractures     Surgical History: Past Surgical History:  Procedure Laterality Date  . COLONOSCOPY WITH PROPOFOL N/A 06/01/2018   Procedure: COLONOSCOPY WITH PROPOFOL;  Surgeon: Daneil Dolin, MD;  [facility];  Service: Endoscopy;  Laterality: N/A;  10:45am  . HIP SURGERY Right 2010   Dr. Dennis Bast and then replacement  . LACERATION REPAIR  05/03/2012   Procedure: REPAIR MULTIPLE LACERATIONS;  Surgeon: Jerrell Belfast, MD;  Location: [facility];  Service: ENT;  Laterality: N/A;  . PENILE PROSTHESIS IMPLANT    . POLYPECTOMY  06/01/2018   Procedure: POLYPECTOMY;  Surgeon: Daneil Dolin, MD;  [facility];  Service: Endoscopy;;  cecum,hepatic flexure  . 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 Yes Did it involve sudden or severe rash/hives, skin peeling, or any reaction on the inside of your mouth or nose? Unknown Did you need to seek medical attention at a hospital or doctor's office? Yes When did it last happen?Childhood reaction If all above answers are "NO",  may proceed with cephalosporin use.      Medication List       Accurate as of February 11, 2020 11:01 AM. If you have any questions, ask your nurse or doctor.        allopurinol 300 MG tablet Commonly known as: ZYLOPRIM Take 300 mg by mouth daily.   aspirin EC 81 MG tablet Take 81 mg by mouth daily.   buPROPion 150 MG 24 hr tablet Commonly known as: WELLBUTRIN XL Take 450 mg by mouth daily.   metoprolol succinate 25 MG 24 hr tablet Commonly known as: Toprol XL Take 1 tablet (25 mg total) by mouth daily.   PriLOSEC OTC 20 MG tablet Generic drug: omeprazole Take 20 mg by mouth daily.   tamsulosin 0.4 MG Caps capsule Commonly known as: FLOMAX Take 0.4 mg by mouth daily.       Allergies:  Allergies  Allergen Reactions  . Penicillins Other (See Comments)    Mother told him throat swelled up when he took at a young age  Did it involve swelling of the face/tongue/throat, SOB, or low BP? Yes Did it involve sudden or severe rash/hives, skin peeling, or any reaction on the inside of your mouth or nose? Unknown Did you need to seek medical attention at a hospital or doctor's office? Yes When did it last happen?Childhood reaction If all above answers  are "NO", may proceed with cephalosporin use.     Family History: Family History  Problem Relation Age of Onset  . Diabetes Mother   . Diabetes Father   . Colon cancer Neg Hx     Social History:  reports that he quit smoking about 16 years ago. His smoking use included cigarettes. He has a 10.00 pack-year smoking history. He has never used smokeless tobacco. He reports that he does not drink alcohol and does not use drugs.  ROS: All other review of systems were reviewed and are negative except what is noted above in HPI  Physical Exam: BP 121/76   Pulse 84   Temp 98.8 F (37.1 C)   Ht 5\' 11"  (1.803 m)   Wt 227 lb 3.2 oz (103.1 kg)   BMI 31.69 kg/m   Constitutional:  Alert and oriented, No acute  distress. HEENT: [place] AT, moist mucus membranes.  Trachea midline, no masses. Cardiovascular: No clubbing, cyanosis, or edema. Respiratory: Normal respiratory effort, no increased work of breathing. GI: Abdomen is soft, nontender, nondistended, no abdominal masses GU: No CVA tenderness. Circumcised phallus. No masses/lesions on penis, testis, scrotum. Prostate 40g smooth no nodules no induration.  Lymph: No cervical or inguinal lymphadenopathy. Skin: No rashes, bruises or suspicious lesions. Neurologic: Grossly intact, no focal deficits, moving all 4 extremities. Psychiatric: Normal mood and affect.  Laboratory Data: Lab Results  Component Value Date   WBC 6.9 01/29/2020   HGB 16.8 01/29/2020   HCT 52.4 (H) 01/29/2020   MCV 85.2 01/29/2020   PLT 225 01/29/2020    Lab Results  Component Value Date   CREATININE 1.14 01/29/2020    No results found for: PSA  No results found for: TESTOSTERONE  Lab Results  Component Value Date   HGBA1C 5.9 (H) 04/02/2013    Urinalysis    Component Value Date/Time   COLORURINE YELLOW 09/10/2008 0630   APPEARANCEUR CLEAR 09/10/2008 0630   LABSPEC 1.025 09/10/2008 0630   PHURINE 5.5 09/10/2008 0630   GLUCOSEU NEGATIVE 09/10/2008 0630   HGBUR TRACE (A) 09/10/2008 0630   BILIRUBINUR NEGATIVE 09/10/2008 0630   KETONESUR NEGATIVE 09/10/2008 0630   PROTEINUR NEGATIVE 09/10/2008 0630   UROBILINOGEN 0.2 09/10/2008 0630   NITRITE NEGATIVE 09/10/2008 0630   LEUKOCYTESUR NEGATIVE 09/10/2008 0630    No results found for: LABMICR, [place], RBCUA, LABEPIT, MUCUS, BACTERIA  Pertinent Imaging:  No results found for this or any previous visit.  No results found for this or any previous visit.  No results found for this or any previous visit.  No results found for this or any previous visit.  No results found for this or any previous visit.  No results found for this or any previous visit.  No results found for this or any previous  visit.  No results found for this or any previous visit.   Assessment & Plan:    1. Elevated PSA The patient and I talked about etiologies of elevated PSA.  We discussed the possible relationship between elevated PSA, prostate cancer, BPH, prostatitis, and UTI.   Conservative treatment of elevated PSA with watchful waiting was discussed with the patient.  All questions were answered.        All of the risks and benefits along with alternatives to prostate biopsy were discussed with the patient.  The patient gave fully informed consent to proceed with a transrectal ultrasound guided biopsy of the prostate for the evaluation of their evated PSA.  Prostate biopsy instructions and  antibiotics were given to the patient.  - Urinalysis, Routine w reflex microscopic   No follow-ups on file.  Nicolette Bang, MD  [facility] [place]

## 2020-02-11 NOTE — Patient Instructions (Addendum)
Prostate-Specific Antigen Test Why am I having this test? The prostate-specific antigen (PSA) test is a screening test for prostate cancer. It can identify early signs of prostate cancer, which may allow for more effective treatment. Your health care provider may recommend that you have a PSA test starting at age 68 or that you have one earlier or later, depending on your risk factors for prostate cancer. You may also have a PSA test:  To monitor treatment of prostate cancer.  To check whether prostate cancer has returned after treatment.  If you have signs of other conditions that can affect PSA levels, such as: ? An enlarged prostate that is not caused by cancer (benign prostatic hyperplasia, BPH). This condition is very common in older men. ? A prostate infection. What is being tested? This test measures the amount of PSA in your blood. PSA is a protein that is made in the prostate. The prostate naturally produces more PSA as you age, but very high levels may be a sign of a medical condition. What kind of sample is taken?  A blood sample is required for this test. It is usually collected by inserting a needle into a blood vessel or by sticking a finger with a small needle. Blood for this test should be drawn before having an exam of the prostate. How do I prepare for this test? Do not ejaculate starting 24 hours before your test, or as long as told by your health care provider. Tell a health care provider about:  Any allergies you have.  All medicines you are taking, including vitamins, herbs, eye drops, creams, and over-the-counter medicines. This also includes: ? Medicines to assist with hair growth, such as finasteride. ? Any recent exposure to a medicine called diethylstilbestrol.  Any blood disorders you have.  Any recent procedures you have had, especially any procedures involving the prostate or rectum.  Any medical conditions you have.  Any recent urinary tract infections  (UTIs) you have had. How are the results reported? Your test results will be reported as a value that indicates how much PSA is in your blood. This will be given as nanograms of PSA per milliliter of blood (ng/mL). Your health care provider will compare your results to normal ranges that were established after testing a large group of people (reference ranges). Reference ranges may vary among labs and hospitals. PSA levels vary from person to person and generally increase with age. Because of this variation, there is no single PSA value that is considered normal for everyone. Instead, PSA reference ranges are used to describe whether your PSA levels are considered low or high (elevated). Common reference ranges are:  Low: 0-2.5 ng/mL.  Slightly to moderately elevated: 2.6-10.0 ng/mL.  Moderately elevated: 10.0-19.9 ng/mL.  Significantly elevated: 20 ng/mL or greater. Sometimes, the test results may report that a condition is present when it is not present (false-positive result). What do the results mean? A test result that is higher than 4 ng/mL may mean that you are at an increased risk for prostate cancer. However, a PSA test by itself is not enough to diagnose prostate cancer. High PSA levels may also be caused by the natural aging process, prostate infection, or BPH. PSA screening cannot tell you if your PSA is high due to cancer or a different cause. A prostate biopsy is the only way to diagnose prostate cancer. A risk of having the PSA test is diagnosing and treating prostate cancer that would never have caused any   symptoms or problems (overdiagnosis and overtreatment). Talk with your health care provider about what your results mean. Questions to ask your health care provider Ask your health care provider, or the department that is doing the test:  When will my results be ready?  How will I get my results?  What are my treatment options?  What other tests do I need?  What are my  next steps? Summary  The prostate-specific antigen (PSA) test is a screening test for prostate cancer.  Your health care provider may recommend that you have a PSA test starting at age 18 or that you have one earlier or later, depending on your risk factors for prostate cancer.  A test result that is higher than 4 ng/mL may mean that you are at an increased risk for prostate cancer. However, elevated levels can be caused by a number of conditions other than prostate cancer.  Talk with your health care provider about what your results mean. This information is not intended to replace advice given to you by your health care provider. Make sure you discuss any questions you have with your health care provider. Document Revised: 03/18/2017 Document Reviewed: 01/10/2017 Elsevier Patient Education  [place].          Appointment Time: 12:30 Please arrive by 12:00 noon Appointment Date: 03/12/2020  Location: [facility]   Prostate Biopsy Instructions  Stop all aspirin or blood thinners (aspirin, plavix, coumadin, warfarin, motrin, ibuprofen, advil, aleve, naproxen, naprosyn) for 7 days prior to the procedure.  If you have any questions about stopping these medications, please contact your primary care physician or cardiologist.  Having a light meal prior to the procedure is recommended.  If you are diabetic or have low blood sugar please bring a small snack or glucose tablet.  A Fleets enema is needed to be purchased over the counter at a local pharmacy and used 2 hours before you scheduled appointment.  This can be purchased over the counter at any pharmacy.  Antibiotics will be administered in the clinic at the time of the procedure and 1 tablet has been sent to your pharmacy. Please take the antibiotic as prescribed.    Please bring someone with you to the procedure to drive you home if you are given a valium to take prior to your procedure.   If you  have any questions or concerns, please feel free to call the office at [phone] or send a Mychart message.    Thank you, [facility]

## 2020-02-11 NOTE — Progress Notes (Signed)
Urological Symptom Review  Patient is experiencing the following symptoms: Get up at night sometimes   Review of Systems  Gastrointestinal (upper)  : Negative for upper GI symptoms  Gastrointestinal (lower) : Negative for lower GI symptoms  Constitutional : Fatigue  Skin: Negative for skin symptoms  Eyes: Negative for eye symptoms  Ear/Nose/Throat : Negative for Ear/Nose/Throat symptoms  Hematologic/Lymphatic: Negative for Hematologic/Lymphatic symptoms  Cardiovascular : Negative for cardiovascular symptoms  Respiratory : Negative for respiratory symptoms  Endocrine: Negative for endocrine symptoms  Musculoskeletal: Negative for musculoskeletal symptoms  Neurological: Negative for neurological symptoms  Psychologic: Negative for psychiatric symptoms

## 2020-02-12 ENCOUNTER — Inpatient Hospital Stay (HOSPITAL_COMMUNITY): Payer: PPO

## 2020-02-12 ENCOUNTER — Encounter (HOSPITAL_COMMUNITY): Payer: Self-pay

## 2020-02-12 DIAGNOSIS — D45 Polycythemia vera: Secondary | ICD-10-CM | POA: Diagnosis not present

## 2020-02-12 NOTE — Progress Notes (Signed)
Brandon Maddox presents today for phlebotomy per MD orders. Hgb 16.8/hct 52.4 Phlebotomy procedure started at 14:02 pm and ended at 14:12 pm. 500 cc removed. Patient tolerated procedure well. IV needle removed intact.

## 2020-02-12 NOTE — Patient Instructions (Signed)
[facility]  Discharge Instructions:   _______________________________________________________________  Thank you for choosing [facility] to provide your oncology and hematology care.  To afford each patient quality time with our providers, please arrive at least 15 minutes before your scheduled appointment.  You need to re-schedule your appointment if you arrive 10 or more minutes late.  We strive to give you quality time with our providers, and arriving late affects you and other patients whose appointments are after yours.  Also, if you no show three or more times for appointments you may be dismissed from the clinic.  Again, thank you for choosing  [facility] at [facility]. Our hope is that these requests will allow you access to exceptional care and in a timely manner. _______________________________________________________________  If you have questions after your visit, please contact our office at [phone] between the hours of 8:30 a.m. and 5:00 p.m. Voicemails left after 4:30 p.m. will not be returned until the following business day. _______________________________________________________________  For prescription refill requests, have your pharmacy contact our office. _______________________________________________________________  Recommendations made by the consultant and any test results will be sent to your referring physician. _______________________________________________________________ 

## 2020-03-12 ENCOUNTER — Other Ambulatory Visit: Payer: Self-pay

## 2020-03-12 ENCOUNTER — Encounter: Payer: Self-pay | Admitting: Urology

## 2020-03-12 ENCOUNTER — Encounter (HOSPITAL_COMMUNITY): Payer: Self-pay

## 2020-03-12 ENCOUNTER — Ambulatory Visit (HOSPITAL_COMMUNITY)
Admission: RE | Admit: 2020-03-12 | Discharge: 2020-03-12 | Disposition: A | Discharge status: Home / Self Care | Payer: PPO | Source: Ambulatory Visit | Attending: Urology | Admitting: Urology

## 2020-03-12 ENCOUNTER — Other Ambulatory Visit: Payer: Self-pay | Admitting: Urology

## 2020-03-12 ENCOUNTER — Ambulatory Visit (INDEPENDENT_AMBULATORY_CARE_PROVIDER_SITE_OTHER): Payer: PPO | Admitting: Urology

## 2020-03-12 DIAGNOSIS — R972 Elevated prostate specific antigen [PSA]: Secondary | ICD-10-CM | POA: Insufficient documentation

## 2020-03-12 DIAGNOSIS — C61 Malignant neoplasm of prostate: Secondary | ICD-10-CM | POA: Diagnosis not present

## 2020-03-12 MED ORDER — LIDOCAINE HCL (PF) 2 % IJ SOLN
INTRAMUSCULAR | Status: AC
  Administered 2020-03-12: 10 mL via INTRADERMAL
  Filled 2020-03-12: qty 10

## 2020-03-12 MED ORDER — LIDOCAINE HCL (PF) 2 % IJ SOLN: 10.0000 mL | Freq: Once | INTRAMUSCULAR | Status: AC

## 2020-03-12 MED ORDER — GENTAMICIN SULFATE 40 MG/ML IJ SOLN
INTRAMUSCULAR | Status: AC
  Administered 2020-03-12: 80 mg via INTRAMUSCULAR
  Filled 2020-03-12: qty 2

## 2020-03-12 MED ORDER — GENTAMICIN SULFATE 40 MG/ML IJ SOLN: 80.0000 mg | Freq: Once | INTRAMUSCULAR | Status: AC

## 2020-03-12 NOTE — Progress Notes (Signed)
Prostate Biopsy Procedure   Informed consent was obtained after discussing risks/benefits of the procedure.  A time out was performed to ensure correct patient identity.  Pre-Procedure: - Last PSA Level: No results found for: PSA - Gentamicin given prophylactically - Levaquin 750 mg administered PO -Transrectal Ultrasound performed revealing a 34.07 gm prostate -No significant hypoechoic or median lobe noted  Procedure: - Prostate block performed using 10 cc 1% lidocaine and biopsies taken from sextant areas, a total of 12 under ultrasound guidance.  Post-Procedure: - Patient tolerated the procedure well - He was counseled to seek immediate medical attention if experiences any severe pain, significant bleeding, or fevers - Return in one week to discuss biopsy results

## 2020-03-12 NOTE — Sedation Documentation (Signed)
PT tolerated prostate biopsy procedure well today. Labs obtained and sent for pathology. PT ambulatory at discharge with no acute distress noted and verbalized understanding of discharge instructions. PT to follow up with urologist as scheduled. 

## 2020-03-12 NOTE — Discharge Instructions (Signed)

## 2020-03-12 NOTE — Patient Instructions (Signed)

## 2020-03-26 ENCOUNTER — Other Ambulatory Visit: Payer: Self-pay

## 2020-03-26 ENCOUNTER — Encounter: Payer: Self-pay | Admitting: Urology

## 2020-03-26 ENCOUNTER — Ambulatory Visit (INDEPENDENT_AMBULATORY_CARE_PROVIDER_SITE_OTHER): Payer: PPO | Admitting: Urology

## 2020-03-26 VITALS — BP 122/83 | HR 90 | Temp 99.3°F | Ht 71.0 in | Wt 227.0 lb

## 2020-03-26 DIAGNOSIS — C61 Malignant neoplasm of prostate: Secondary | ICD-10-CM

## 2020-03-26 NOTE — Progress Notes (Signed)
03/26/2020 4:39 PM   Brandon Maddox December 14, 1950 [id number]  Referring provider: [name], MD [address] [place],  [place] [address]  Followup prostate biopsy  HPI: [name] is a 69yo here for followup for after prostate biopsy. 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 He has mild LUTS o flomax. He has ED and has an IPP in place.    PMH: Past Medical History:  Diagnosis Date  . Acid reflux   . Atrial fibrillation ([place])   . Closed right scapular fracture   . Depression   . GERD (gastroesophageal reflux disease)   . Gout   . Low testosterone   . Polycythemia vera ([place]) 01/17/2017  . Ribs, multiple fractures     Surgical History: Past Surgical History:  Procedure Laterality Date  . COLONOSCOPY WITH PROPOFOL N/A 06/01/2018   Procedure: COLONOSCOPY WITH PROPOFOL;  Surgeon: Daneil Dolin, MD;  [facility];  Service: Endoscopy;  Laterality: N/A;  10:45am  . HIP SURGERY Right 2010   Dr. Dennis Bast and then replacement  . LACERATION REPAIR  05/03/2012   Procedure: REPAIR MULTIPLE LACERATIONS;  Surgeon: Jerrell Belfast, MD;  Location: [facility];  Service: ENT;  Laterality: N/A;  . PENILE PROSTHESIS IMPLANT    . POLYPECTOMY  06/01/2018   Procedure: POLYPECTOMY;  Surgeon: Daneil Dolin, MD;  [facility];  Service: Endoscopy;;  cecum,hepatic flexure  . Pyloric stenosis     70 weeks old    Home Medications:  Allergies as of 03/26/2020      Reactions   Penicillins Other (See Comments)   Mother told him throat swelled up when he took at a young age  Did it involve swelling of the face/tongue/throat, SOB, or low BP? Yes Did it involve sudden or severe rash/hives, skin peeling, or any reaction on the inside of your mouth or nose? Unknown Did you need to seek medical attention at a hospital or doctor's office? Yes When did it last happen?Childhood reaction If all above answers are "NO", may proceed with cephalosporin use.      Medication  List       Accurate as of March 26, 2020  4:39 PM. If you have any questions, ask your nurse or doctor.        allopurinol 300 MG tablet Commonly known as: ZYLOPRIM Take 300 mg by mouth daily.   aspirin EC 81 MG tablet Take 81 mg by mouth daily.   buPROPion 150 MG 24 hr tablet Commonly known as: WELLBUTRIN XL Take 450 mg by mouth daily.   metoprolol succinate 25 MG 24 hr tablet Commonly known as: Toprol XL Take 1 tablet (25 mg total) by mouth daily.   PriLOSEC OTC 20 MG tablet Generic drug: omeprazole Take 20 mg by mouth daily.   tamsulosin 0.4 MG Caps capsule Commonly known as: FLOMAX Take 0.4 mg by mouth daily.       Allergies:  Allergies  Allergen Reactions  . Penicillins Other (See Comments)    Mother told him throat swelled up when he took at a young age  Did it involve swelling of the face/tongue/throat, SOB, or low BP? Yes Did it involve sudden or severe rash/hives, skin peeling, or any reaction on the inside of your mouth or nose? Unknown Did you need to seek medical attention at a hospital or doctor's office? Yes When did it last happen?Childhood reaction If all above answers are "NO", may proceed with cephalosporin use.  Family History: Family History  Problem Relation Age of Onset  . Diabetes Mother   . Diabetes Father   . Colon cancer Neg Hx     Social History:  reports that he quit smoking about 16 years ago. His smoking use included cigarettes. He has a 10.00 pack-year smoking history. He has never used smokeless tobacco. He reports that he does not drink alcohol and does not use drugs.  ROS: All other review of systems were reviewed and are negative except what is noted above in HPI  Physical Exam: BP 122/83   Pulse 90   Temp 99.3 F (37.4 C)   Ht 5\' 11"  (1.803 m)   Wt 227 lb (103 kg)   BMI 31.66 kg/m   Constitutional:  Alert and oriented, No acute distress. HEENT: [place] AT, moist mucus membranes.  Trachea midline, no  masses. Cardiovascular: No clubbing, cyanosis, or edema. Respiratory: Normal respiratory effort, no increased work of breathing. GI: Abdomen is soft, nontender, nondistended, no abdominal masses GU: No CVA tenderness.  Lymph: No cervical or inguinal lymphadenopathy. Skin: No rashes, bruises or suspicious lesions. Neurologic: Grossly intact, no focal deficits, moving all 4 extremities. Psychiatric: Normal mood and affect.  Laboratory Data: Lab Results  Component Value Date   WBC 6.9 01/29/2020   HGB 16.8 01/29/2020   HCT 52.4 (H) 01/29/2020   MCV 85.2 01/29/2020   PLT 225 01/29/2020    Lab Results  Component Value Date   CREATININE 1.14 01/29/2020    No results found for: PSA  No results found for: TESTOSTERONE  Lab Results  Component Value Date   HGBA1C 5.9 (H) 04/02/2013    Urinalysis    Component Value Date/Time   COLORURINE YELLOW 09/10/2008 0630   APPEARANCEUR Clear 02/11/2020 1117   LABSPEC 1.025 09/10/2008 0630   PHURINE 5.5 09/10/2008 0630   GLUCOSEU Negative 02/11/2020 1117   HGBUR TRACE (A) 09/10/2008 0630   BILIRUBINUR Negative 02/11/2020 1117   KETONESUR NEGATIVE 09/10/2008 0630   PROTEINUR Negative 02/11/2020 1117   PROTEINUR NEGATIVE 09/10/2008 0630   UROBILINOGEN 0.2 09/10/2008 0630   NITRITE Negative 02/11/2020 1117   NITRITE NEGATIVE 09/10/2008 0630   LEUKOCYTESUR Negative 02/11/2020 1117    Lab Results  Component Value Date   LABMICR Comment 02/11/2020    Pertinent Imaging:  No results found for this or any previous visit.  No results found for this or any previous visit.  No results found for this or any previous visit.  No results found for this or any previous visit.  No results found for this or any previous visit.  No results found for this or any previous visit.  No results found for this or any previous visit.  No results found for this or any previous visit.   Assessment & Plan:    1. Prostate cancer [facility]) I  discussed the natural history of favorable intermediate risk prostate cancer with the patient and the various treatment options including active surveillance, RALP, IMRT, brachytherapy, cryotherapy, HIFU and ADT. After discussing the options the patient elects for RALP versus brachytherapy. I will refer him to Dr. Tresa Moore for consideration of RALP and Dr. Tammi Klippel for consideration of brachytherapy   Return in about 4 weeks (around 04/23/2020).  Nicolette Bang, MD  [facility] [place]

## 2020-03-26 NOTE — Patient Instructions (Signed)
Prostate Cancer  The prostate is a male gland that helps make semen. Prostate cancer is when abnormal cells grow in this gland. Follow these instructions at home:  Take over-the-counter and prescription medicines only as told by your doctor.  Eat a healthy diet.  Get plenty of sleep.  Ask your doctor for help to find a support group for men with prostate cancer.  Keep all follow-up visits as told by your doctor. This is important.  If you have to go to the hospital, let your cancer doctor (oncologist) know.  Touch, hold, hug, and caress your partner to continue to show sexual feelings. Contact a doctor if:  You have trouble peeing (urinating).  You have blood in your pee (urine).  You have pain in your hips, back, or chest. Get help right away if:  You have weakness in your legs.  You lose feeling (have numbness) in your legs.  You cannot control your pee or your poop (stool).  You have trouble breathing.  You have sudden pain in your chest.  You have chills or a fever. Summary  The prostate is a male gland that helps make semen. Prostate cancer is when abnormal cells grow in this gland.  Ask your doctor for help to find a support group for men with prostate cancer.  Contact a doctor if you have problems peeing or have any new pain that you did not have before. This information is not intended to replace advice given to you by your health care provider. Make sure you discuss any questions you have with your health care provider. Document Revised: 03/18/2017 Document Reviewed: 12/15/2015 Elsevier Patient Education  2020 Elsevier Inc.  

## 2020-04-21 DIAGNOSIS — R3915 Urgency of urination: Secondary | ICD-10-CM | POA: Diagnosis not present

## 2020-04-21 DIAGNOSIS — C61 Malignant neoplasm of prostate: Secondary | ICD-10-CM | POA: Diagnosis not present

## 2020-04-21 DIAGNOSIS — N5201 Erectile dysfunction due to arterial insufficiency: Secondary | ICD-10-CM | POA: Diagnosis not present

## 2020-04-28 ENCOUNTER — Encounter: Payer: Self-pay | Admitting: Radiation Oncology

## 2020-04-28 NOTE — Progress Notes (Signed)
GU Location of Tumor / Histology: prostatic adenocarcinoma  If Prostate Cancer, Gleason Score is (3 + 3) and PSA is (5.1). Prostate volume: 34 mL  Brandon Maddox reports he went to his PCP in September 2021 for blood work and his PSA was up so he was referred to Dr. Alyson Ingles for further evaluation. Patient reports he is scheduled to follow up with Dr. Alyson Ingles tomorrow.   Biopsies of prostate (if applicable) revealed:   Past/Anticipated interventions by urology, if any: Penile implant with two reservoirs (manage severe pyroines), prostate biopsy, referral to Dr. Tammi Klippel to discuss radiation options  Past/Anticipated interventions by medical oncology, if any: no  Weight changes, if any: denies  Bowel/Bladder complaints, if any: IPSS 2. SHIM 20 because of penile implants. Reports he has not attempted intercourse recently. Denies dysuria or hematuria. Reports leakage resolved with flomax. Denies any bowel complaints.   Nausea/Vomiting, if any: denies  Pain issues, if any:  denies  SAFETY ISSUES:  Prior radiation? Yes, 1996 for pyroines plaque  Pacemaker/ICD? denies  Possible current pregnancy? no, male patient  Is the patient on methotrexate? denies  Current Complaints / other details:  70 year old male. 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 Former smoker. Divorced.

## 2020-04-29 ENCOUNTER — Encounter: Payer: Self-pay | Admitting: Radiation Oncology

## 2020-04-29 ENCOUNTER — Ambulatory Visit
Admission: RE | Admit: 2020-04-29 | Discharge: 2020-04-29 | Disposition: A | Discharge status: Home / Self Care | Payer: PPO | Source: Ambulatory Visit | Attending: Radiation Oncology | Admitting: Radiation Oncology

## 2020-04-29 ENCOUNTER — Other Ambulatory Visit: Payer: Self-pay

## 2020-04-29 VITALS — Ht 71.0 in | Wt 228.0 lb

## 2020-04-29 DIAGNOSIS — C61 Malignant neoplasm of prostate: Secondary | ICD-10-CM | POA: Diagnosis not present

## 2020-04-29 HISTORY — DX: Malignant neoplasm of prostate: C61

## 2020-04-29 NOTE — Progress Notes (Signed)
Radiation Oncology         [phone] ________________________________  Initial Outpatient Consultation - Conducted via Telephone due to current COVID-19 concerns for limiting patient exposure  Name: Brandon Maddox MRN: [id number]  Date: 04/29/2020  DOB: 1951-02-06  [id number], Carloyn Manner, MD  McKenzie, Candee Furbish, MD   REFERRING PHYSICIAN: Cleon Gustin, MD  DIAGNOSIS: 70 y.o. gentleman with Stage T1c adenocarcinoma of the prostate with Gleason score of 3+3, and PSA of 5.1.    ICD-10-CM   1. Prostate cancer [facility])  C61     HISTORY OF PRESENT ILLNESS: Brandon Maddox is a 70 y.o. male with a diagnosis of prostate cancer. He was noted to have an elevated PSA of 5.1 by his primary care physician, Dr. Willey Blade.  His previous PSA was around 3.0 in 2019.  Accordingly, he was referred for evaluation in urology by Dr. Alyson Ingles on 02/11/2020,  digital rectal examination was performed at that time revealing no nodules.  The patient proceeded to transrectal ultrasound with 12 biopsies of the prostate on 03/12/2020.  The prostate volume measured 34 cc.  Out of 12 core biopsies, 6 were positive.  The maximum Gleason score was 3+3, and this was seen in the left mid lateral (small focus, with PNI), left apex lateral (with PNI), left mid (small focus), left apex, right mid lateral (small focus), and the right apex lateral.  He was referred to Dr. Tresa Moore on 04/21/2020 to discuss prostatectomy but at the conclusion of their conversation, he was leaning towards proceeding with brachytherapy.  The patient reviewed the biopsy results with his urologist and he has kindly been referred today for discussion of potential radiation treatment options.   PREVIOUS RADIATION THERAPY: Yes-  1996 for pyroines plaque  PAST MEDICAL HISTORY:  Past Medical History:  Diagnosis Date  . Acid reflux   . Atrial fibrillation ([place])   . Closed right scapular fracture   . Depression   . GERD (gastroesophageal reflux disease)   . Gout    . Low testosterone   . Polycythemia vera ([place]) 01/17/2017  . Prostate cancer ([place])   . Ribs, multiple fractures       PAST SURGICAL HISTORY: Past Surgical History:  Procedure Laterality Date  . COLONOSCOPY WITH PROPOFOL N/A 06/01/2018   Procedure: COLONOSCOPY WITH PROPOFOL;  Surgeon: Daneil Dolin, MD;  [facility];  Service: Endoscopy;  Laterality: N/A;  10:45am  . HIP SURGERY Right 2010   Dr. Dennis Bast and then replacement  . LACERATION REPAIR  05/03/2012   Procedure: REPAIR MULTIPLE LACERATIONS;  Surgeon: Jerrell Belfast, MD;  Location: [place];  Service: ENT;  Laterality: N/A;  . PENILE PROSTHESIS IMPLANT    . POLYPECTOMY  06/01/2018   Procedure: POLYPECTOMY;  Surgeon: Daneil Dolin, MD;  [facility];  Service: Endoscopy;;  cecum,hepatic flexure  . Pyloric stenosis     84 weeks old    FAMILY HISTORY:  Family History  Problem Relation Age of Onset  . Diabetes Mother   . Breast cancer Mother   . Diabetes Father   . Colon cancer Neg Hx   . Pancreatic cancer Neg Hx   . Prostate cancer Neg Hx     SOCIAL HISTORY:  Social History   Socioeconomic History  . Marital status: Divorced    Spouse name: Not on file  . Number of children: 1  . Years of education: Not on file  . Highest education level: Not on file  Occupational History  Employer: SELF EMPLOYED    Comment: retired  Tobacco Use  . Smoking status: Former Smoker    Packs/day: 1.00    Years: 10.00    Pack years: 10.00    Types: Cigarettes    Quit date: 04/20/2003    Years since quitting: 17.0  . Smokeless tobacco: Never Used  Vaping Use  . Vaping Use: Never used  Substance and Sexual Activity  . Alcohol use: No    Alcohol/week: 0.0 standard drinks    Comment: History of alcohol abuse in the past; none in 5 years  . Drug use: No  . Sexual activity: Yes  Other Topics Concern  . Not on file  Social History Narrative  . Not on file   Social Determinants of Health   Financial  Resource Strain: Not on file  Food Insecurity: Not on file  Transportation Needs: Not on file  Physical Activity: Not on file  Stress: Not on file  Social Connections: Not on file  Intimate Partner Violence: Not on file    ALLERGIES: Penicillins  MEDICATIONS:  Current Outpatient Medications  Medication Sig Dispense Refill  . allopurinol (ZYLOPRIM) 300 MG tablet Take 300 mg by mouth daily.     Marland Kitchen aspirin EC 81 MG tablet Take 81 mg by mouth daily.    Marland Kitchen buPROPion (WELLBUTRIN XL) 150 MG 24 hr tablet Take 450 mg by mouth daily.   4  . metoprolol succinate (TOPROL XL) 25 MG 24 hr tablet Take 1 tablet (25 mg total) by mouth daily. 90 tablet 3  . omeprazole (PRILOSEC OTC) 20 MG tablet Take 20 mg by mouth daily.     . Tamsulosin HCl (FLOMAX) 0.4 MG CAPS Take 0.4 mg by mouth daily.      No current facility-administered medications for this encounter.    REVIEW OF SYSTEMS:  On review of systems, the patient reports that he is doing well overall. He denies any chest pain, shortness of breath, cough, fevers, chills, night sweats, unintended weight changes. He denies any bowel disturbances, and denies abdominal pain, nausea or vomiting. He denies any new musculoskeletal or joint aches or pains. His IPSS was 2, indicating mild urinary symptoms. He endorses taking Flomax daily, which has improved his prior symptoms and he is happy with his current voiding pattern. His SHIM was 20 with aid of an implantable penile prosthesis but is not currently sexually active. A complete review of systems is obtained and is otherwise negative.    PHYSICAL EXAM:  Wt Readings from Last 3 Encounters:  04/29/20 228 lb (103.4 kg)  03/26/20 227 lb (103 kg)  02/11/20 227 lb 3.2 oz (103.1 kg)   Temp Readings from Last 3 Encounters:  03/26/20 99.3 F (37.4 C)  03/12/20 98.3 F (36.8 C) (Oral)  02/12/20 (!) 96.9 F (36.1 C) (Temporal)   BP Readings from Last 3 Encounters:  03/26/20 122/83  03/12/20 112/64  02/12/20  113/71   Pulse Readings from Last 3 Encounters:  03/26/20 90  03/12/20 62  02/12/20 77   Pain Assessment Pain Score: 0-No pain (no new pain)/10  Physical exam not performed in light of telephone consult visit format.   KPS = 100  100 - Normal; no complaints; no evidence of disease. 90   - Able to carry on normal activity; minor signs or symptoms of disease. 80   - Normal activity with effort; some signs or symptoms of disease. 43   - Cares for self; unable to carry on normal activity  or to do active work. 60   - Requires occasional assistance, but is able to care for most of his personal needs. 50   - Requires considerable assistance and frequent medical care. 61   - Disabled; requires special care and assistance. 27   - Severely disabled; hospital admission is indicated although death not imminent. 45   - Very sick; hospital admission necessary; active supportive treatment necessary. 10   - Moribund; fatal processes progressing rapidly. 0     - Dead  Karnofsky DA, Abelmann [place], Craver LS and Burchenal [facility] [phone]) The use of the nitrogen mustards in the palliative treatment of carcinoma: with particular reference to bronchogenic carcinoma Cancer 1 634-56  LABORATORY DATA:  Lab Results  Component Value Date   WBC 6.9 01/29/2020   HGB 16.8 01/29/2020   HCT 52.4 (H) 01/29/2020   MCV 85.2 01/29/2020   PLT 225 01/29/2020   Lab Results  Component Value Date   NA 134 (L) 01/29/2020   K 5.2 (H) 01/29/2020   CL 99 01/29/2020   CO2 27 01/29/2020   Lab Results  Component Value Date   ALT 18 01/29/2020   AST 16 01/29/2020   ALKPHOS 72 01/29/2020   BILITOT 0.9 01/29/2020     RADIOGRAPHY: No results found.    IMPRESSION/PLAN: This visit was conducted via Telephone to spare the patient unnecessary potential exposure in the healthcare setting during the current COVID-19 pandemic. 1. 70 y.o. gentleman with Stage T1c adenocarcinoma of the prostate with Gleason Score of 3+3, and PSA  of 5.1. We discussed the patient's workup and outlined the nature of prostate cancer in this setting. The patient's T stage, Gleason's score, and PSA put him into the low-intermediate risk group. Accordingly, he is eligible for a variety of potential treatment options including brachytherapy, 5.5 weeks of external radiation, or prostatectomy. We discussed the available radiation techniques, and focused on the details and logistics of delivery. We discussed and outlined the risks, benefits, short and long-term effects associated with radiotherapy and compared and contrasted these with prostatectomy. We discussed the role of SpaceOAR in reducing the rectal toxicity associated with radiotherapy. He appears to have a good understanding of his disease and our treatment recommendations which are of curative intent.  He was encouraged to ask questions that were answered to his stated satisfaction.  At the end of the conversation, the patient is interested in moving forward with brachytherapy and use of SpaceOAR gel to reduce rectal toxicity from radiotherapy.  We will share our discussion with Dr. Alyson Ingles and move forward with scheduling his CT [facility] planning appointment in the near future.  He notes that he has a scheduled follow up visit with Dr. Alyson Ingles tomorrow morning, 04/30/20.  The patient will be contacted by Romie Jumper in our office who will be working closely with him to coordinate OR scheduling and pre and post procedure appointments.  We will contact the pharmaceutical rep to ensure that Brandon Maddox is available at the time of procedure.  We enjoyed meeting him today and look forward to continuing to participate in his care.   Given current concerns for patient exposure during the COVID-19 pandemic, this encounter was conducted via telephone. The patient was notified in advance and was offered a MyChart meeting to allow for face to face communication but unfortunately reported that he did not have the  appropriate resources/technology to support such a visit and instead preferred to proceed with telephone consult. The patient has given verbal consent for  this type of encounter. The time spent during this encounter was 60 minutes. The attendants for this meeting include Tyler Pita MD, Ashlyn Bruning PA-C, [place], and patient Brandon Maddox. During the encounter, Tyler Pita MD, Ashlyn Bruning PA-C, and scribe, Wilburn Mylar were located at [place].  Patient Brandon Maddox was located at home.    Nicholos Johns, PA-C    Tyler Pita, MD  [facility] Direct Dial: [phone]  Fax: [phone] [email or web address]  Skype  [email or web address]  This document serves as a record of services personally performed by Tyler Pita, MD and Freeman Caldron, PA-C. It was created on their behalf by Wilburn Mylar, a trained medical scribe. The creation of this record is based on the scribe's personal observations and the provider's statements to them. This document has been checked and approved by the attending provider.

## 2020-04-30 ENCOUNTER — Encounter: Payer: Self-pay | Admitting: Urology

## 2020-04-30 ENCOUNTER — Telehealth: Payer: Self-pay | Admitting: *Deleted

## 2020-04-30 ENCOUNTER — Ambulatory Visit (INDEPENDENT_AMBULATORY_CARE_PROVIDER_SITE_OTHER): Payer: PPO | Admitting: Urology

## 2020-04-30 VITALS — BP 96/66 | HR 91 | Temp 98.9°F | Ht 71.0 in | Wt 228.0 lb

## 2020-04-30 DIAGNOSIS — C61 Malignant neoplasm of prostate: Secondary | ICD-10-CM | POA: Diagnosis not present

## 2020-04-30 LAB — URINALYSIS, ROUTINE W REFLEX MICROSCOPIC
Bilirubin, UA: NEGATIVE
Glucose, UA: NEGATIVE
Leukocytes,UA: NEGATIVE
Nitrite, UA: NEGATIVE
RBC, UA: NEGATIVE
Specific Gravity, UA: >1.03 — ABNORMAL HIGH (ref 1.005–1.030)
Urobilinogen, Ur: 1 mg/dL (ref 0.2–1.0)
pH, UA: 5 (ref 5.0–7.5)

## 2020-04-30 NOTE — Progress Notes (Signed)
04/30/2020 9:52 AM   Brandon Maddox February 17, 1951 [id number]  Referring provider: [name], MD [address] [place],  [place] [address]  Followup prostate cancer  HPI: Brandon Maddox is a 6WP here for followup for prostate cancer. He met with Dr. Tammi Maddox and has elected to proceed with brachytherapy with [facility]. He has mild LUTS. Mild ED. No other complaints today   PMH: Past Medical History:  Diagnosis Date  . Acid reflux   . Atrial fibrillation ([place])   . Closed right scapular fracture   . Depression   . GERD (gastroesophageal reflux disease)   . Gout   . Low testosterone   . Polycythemia vera ([place]) 01/17/2017  . Prostate cancer ([place])   . Ribs, multiple fractures     Surgical History: Past Surgical History:  Procedure Laterality Date  . COLONOSCOPY WITH PROPOFOL N/A 06/01/2018   Procedure: COLONOSCOPY WITH PROPOFOL;  Surgeon: Brandon Dolin, MD;  [facility];  Service: Endoscopy;  Laterality: N/A;  10:45am  . HIP SURGERY Right 2010   Dr. Dennis Maddox and then replacement  . LACERATION REPAIR  05/03/2012   Procedure: REPAIR MULTIPLE LACERATIONS;  Surgeon: Brandon Belfast, MD;  Location: [place];  Service: ENT;  Laterality: N/A;  . PENILE PROSTHESIS IMPLANT    . POLYPECTOMY  06/01/2018   Procedure: POLYPECTOMY;  Surgeon: [place] Dolin, MD;  [facility];  Service: Endoscopy;;  cecum,hepatic flexure  . Pyloric stenosis     79 weeks old    Home Medications:  Allergies as of 04/30/2020      Reactions   Penicillins Other (See Comments)   Mother told him throat swelled up when he took at a young age  Did it involve swelling of the face/tongue/throat, SOB, or low BP? Yes Did it involve sudden or severe rash/hives, skin peeling, or any reaction on the inside of your mouth or nose? Unknown Did you need to seek medical attention at a hospital or doctor's office? Yes When did it last happen?Childhood reaction If all above answers are "NO", may  proceed with cephalosporin use.      Medication List       Accurate as of April 30, 2020  9:52 AM. If you have any questions, ask your nurse or doctor.        allopurinol 300 MG tablet Commonly known as: ZYLOPRIM Take 300 mg by mouth daily.   aspirin EC 81 MG tablet Take 81 mg by mouth daily.   buPROPion 150 MG 24 hr tablet Commonly known as: WELLBUTRIN XL Take 450 mg by mouth daily.   metoprolol succinate 25 MG 24 hr tablet Commonly known as: Toprol XL Take 1 tablet (25 mg total) by mouth daily.   omeprazole 20 MG tablet Commonly known as: PRILOSEC OTC Take 20 mg by mouth daily.   tamsulosin 0.4 MG Caps capsule Commonly known as: FLOMAX Take 0.4 mg by mouth daily.       Allergies:  Allergies  Allergen Reactions  . Penicillins Other (See Comments)    Mother told him throat swelled up when he took at a young age  Did it involve swelling of the face/tongue/throat, SOB, or low BP? Yes Did it involve sudden or severe rash/hives, skin peeling, or any reaction on the inside of your mouth or nose? Unknown Did you need to seek medical attention at a hospital or doctor's office? Yes When did it last happen?Childhood reaction If all above answers are "NO", may proceed  with cephalosporin use.     Family History: Family History  Problem Relation Age of Onset  . Diabetes Mother   . Breast cancer Mother   . Diabetes Father   . Colon cancer Neg Hx   . Pancreatic cancer Neg Hx   . Prostate cancer Neg Hx     Social History:  reports that he quit smoking about 17 years ago. His smoking use included cigarettes. He has a 10.00 pack-year smoking history. He has never used smokeless tobacco. He reports that he does not drink alcohol and does not use drugs.  ROS: All other review of systems were reviewed and are negative except what is noted above in HPI  Physical Exam: BP 96/66   Pulse 91   Temp 98.9 F (37.2 C)   Ht 5' 11"  (1.803 m)   Wt 228 lb (103.4 kg)    BMI 31.80 kg/m   Constitutional:  Alert and oriented, No acute distress. HEENT: [place] AT, moist mucus membranes.  Trachea midline, no masses. Cardiovascular: No clubbing, cyanosis, or edema. Respiratory: Normal respiratory effort, no increased work of breathing. GI: Abdomen is soft, nontender, nondistended, no abdominal masses GU: No CVA tenderness.  Lymph: No cervical or inguinal lymphadenopathy. Skin: No rashes, bruises or suspicious lesions. Neurologic: Grossly intact, no focal deficits, moving all 4 extremities. Psychiatric: Normal mood and affect.  Laboratory Data: Lab Results  Component Value Date   WBC 6.9 01/29/2020   HGB 16.8 01/29/2020   HCT 52.4 (H) 01/29/2020   MCV 85.2 01/29/2020   PLT 225 01/29/2020    Lab Results  Component Value Date   CREATININE 1.14 01/29/2020    No results found for: PSA  No results found for: TESTOSTERONE  Lab Results  Component Value Date   HGBA1C 5.9 (H) 04/02/2013    Urinalysis    Component Value Date/Time   COLORURINE YELLOW 09/10/2008 0630   APPEARANCEUR Clear 02/11/2020 1117   LABSPEC 1.025 09/10/2008 0630   PHURINE 5.5 09/10/2008 0630   GLUCOSEU Negative 02/11/2020 1117   HGBUR TRACE (A) 09/10/2008 0630   BILIRUBINUR Negative 02/11/2020 1117   KETONESUR NEGATIVE 09/10/2008 0630   PROTEINUR Negative 02/11/2020 1117   PROTEINUR NEGATIVE 09/10/2008 0630   UROBILINOGEN 0.2 09/10/2008 0630   NITRITE Negative 02/11/2020 1117   NITRITE NEGATIVE 09/10/2008 0630   LEUKOCYTESUR Negative 02/11/2020 1117    Lab Results  Component Value Date   LABMICR Comment 02/11/2020    Pertinent Imaging:  No results found for this or any previous visit.  No results found for this or any previous visit.  No results found for this or any previous visit.  No results found for this or any previous visit.  No results found for this or any previous visit.  No results found for this or any previous visit.  No results found for this or  any previous visit.  No results found for this or any previous visit.   Assessment & Plan:    1. Prostate cancer ([place]) -Schedule for brachytherapy with SpaceOAR. Risks/benefits/alternatives discussed   No follow-ups on file.  Brandon Bang, MD  [facility] [place]

## 2020-04-30 NOTE — Telephone Encounter (Signed)
CALLED PATIENT TO ASK QUESTIONS, SPOKE WITH PATIENT 

## 2020-04-30 NOTE — Patient Instructions (Signed)
Brachytherapy: An international perspective (pp. 71-144). [place]: [employer].">  Brachytherapy for Prostate Cancer Brachytherapy for prostate cancer is a type of radiation treatment that involves placing a source of radiation inside the prostate gland. There are several types of brachytherapy:  Low-dose rate (LDR) therapy. This involves temporary or permanent implants of radioactive seeds or pellets that give off a low dose of radiation. ? Temporary low-dose implants are left in the prostate for 1-7 days. The radioactive material is contained within a delivery tool, which may be a needle, a small, thin tube (catheter), or another type of applicator. You will need to stay in the hospital while the delivery tool and radioactive material are in place. ? Permanent low-dose implants are injected into the prostate. These give off radiation for up to 1 year after they are inserted. After the radiation is gone, they remain harmlessly in the body and are not removed.  High-dose rate (HDR) therapy. This involves inserting a material that gives off a higher dose of radiation for only a few minutes. The radioactive material is often wires or ribbons contained within a delivery tool (needle, applicator, or catheter). The delivery tool is removed after treatment, and no radioactive material is left in the prostate. In brachytherapy, the radiation does not travel far from the prostate, so healthy tissues around the prostate receive only a small dose of radiation. This helps to protect those tissues from injury. In some cases, brachytherapy may be followed by external beam radiation. Tell a health care provider about:  Any allergies you have.  All medicines you are taking, including vitamins, herbs, eye drops, creams, and over-the-counter medicines.  Any problems you or family members have had with anesthetic medicines.  Any surgeries you have had.  Any blood disorders you have.  Any medical conditions you  have. What are the risks? Generally, this is a safe procedure. However, problems may occur, including:  Inflammation of the rectum.  Problems with getting or keeping an erection (erectile dysfunction).  Trouble urinating.  Damage to nearby structures or organs.  Diarrhea.  Bleeding.  Inability to control when you urinate or have bowel movements (incontinence). What happens before the procedure? Staying hydrated Follow instructions from your health care provider about hydration, which may include:  Up to 2 hours before the procedure - you may continue to drink clear liquids, such as water, clear fruit juice, black coffee, and plain tea.   Eating and drinking restrictions Follow instructions from your health care provider about eating and drinking, which may include:  8 hours before the procedure - stop eating heavy meals or foods, such as meat, fried foods, or fatty foods.  6 hours before the procedure - stop eating light meals or foods, such as toast or cereal.  6 hours before the procedure - stop drinking milk or drinks that contain milk.  2 hours before the procedure - stop drinking clear liquids. Medicines Ask your health care provider about:  Changing or stopping your regular medicines. This is especially important if you are taking diabetes medicines or blood thinners.  Taking medicines such as aspirin and ibuprofen. These medicines can thin your blood. Do not take these medicines unless your health care provider tells you to take them.  Taking over-the-counter medicines, vitamins, herbs, and supplements. Tests You may have an exam or testing. This may include:  Imaging tests, such as an ultrasound, a CT scan, or an MRI.  Blood or urine tests.  A test to check the electrical  signals in your heart (electrocardiogram). General instructions  Do not use any products that contain nicotine or tobacco for at least 4 weeks before the procedure. These products include  cigarettes, e-cigarettes, and chewing tobacco. If you need help quitting, ask your health care provider.  If you will be going home right after the procedure: ? Plan to have someone take you home from the hospital or clinic. ? Plan to have a responsible adult care for you for at least 24 hours after you leave the hospital or clinic. This is important.  You may need to take medicine to clean out your bowel (bowel prep).  Ask your health care provider: ? How your surgery site will be marked. ? What steps will be taken to help prevent infection. These may include:  Removing hair at the surgery site.  Washing skin with a germ-killing soap.  Taking antibiotic medicine. What happens during the procedure?  An IV will be inserted into one of your veins.  You will be given one or more of the following: ? A medicine to help you relax (sedative). ? A medicine to numb the area (local anesthetic). ? A medicine to make you fall asleep (general anesthetic).  You may have a catheter inserted to drain your bladder.  Your surgeon will insert the radioactive material. The method used will vary depending on whether you are receiving temporary or permanent brachytherapy. Temporary low-dose or high-dose brachytherapy  A delivery tool (needle, applicator, or catheter) will be inserted into the prostate. It will be inserted through a body cavity, such as the rectum, or through the perineum, which is the area beneath the scrotum.  An X-ray, ultrasound, MRI, or CT scan will be used to guide the delivery tool toward the prostate.  Radioactive seeds, pellets, wires, or ribbons will be fed through the delivery tool.  If the high-dose method is used: ? The radioactive material will be left in for a few minutes and then removed. ? When the treatment is finished, the delivery tool will be removed.  If the low-dose method is used: ? The delivery tool containing the radioactive material will stay in place  for 1-7 days. ? You will remain in the hospital while the implant is in place. ? When the treatment is finished, the radioactive material and delivery tool will be removed. Permanent low-dose brachytherapy  A tube or needle will be used to inject small, radioactive seeds or pellets into your prostate.  The needle or tube will be removed, leaving the seeds or pellets in the prostate. The procedure may vary among health care providers and hospitals. What happens after the procedure?  Your blood pressure, heart rate, breathing rate, and blood oxygen level will be monitored until you leave the hospital or clinic.  If you were given a sedative during the procedure, it can affect you for several hours. Do not drive or operate machinery until your health care provider says that it is safe. Summary  Brachytherapy for prostate cancer is a type of radiation treatment that involves placing a source of radiation inside the prostate gland.  There are several types of brachytherapy for prostate cancer: low-dose temporary treatment, low-dose permanent treatment, and high-dose temporary treatment.  The amount of time that the source of radiation is left in your prostate will depend on the type of brachytherapy you are having. This information is not intended to replace advice given to you by your health care provider. Make sure you discuss any questions you have  with your health care provider. Document Revised: 04/16/2019 Document Reviewed: 02/05/2019 Elsevier Patient Education  [place].

## 2020-04-30 NOTE — Progress Notes (Signed)
Urological Symptom Review  Patient is experiencing the following symptoms: Leakage of urine   Review of Systems  Gastrointestinal (upper)  : Negative for upper GI symptoms  Gastrointestinal (lower) : Negative for lower GI symptoms  Constitutional : Fatigue  Skin: Negative for skin symptoms  Eyes: Negative for eye symptoms  Ear/Nose/Throat : Negative for Ear/Nose/Throat symptoms  Hematologic/Lymphatic: Easy bruising  Cardiovascular : Negative for cardiovascular symptoms  Respiratory : Negative for respiratory symptoms  Endocrine: Negative for endocrine symptoms  Musculoskeletal: Negative for musculoskeletal symptoms  Neurological: Negative for neurological symptoms  Psychologic: Depression

## 2020-05-21 ENCOUNTER — Telehealth: Payer: Self-pay | Admitting: *Deleted

## 2020-05-21 NOTE — Telephone Encounter (Signed)
Called patient to remind of pre-seed appts. for 05-22-20, spoke with patient and he is aware of these appts.

## 2020-05-22 ENCOUNTER — Ambulatory Visit (HOSPITAL_COMMUNITY)
Admission: RE | Admit: 2020-05-22 | Discharge: 2020-05-22 | Disposition: A | Discharge status: Home / Self Care | Payer: PPO | Source: Ambulatory Visit | Attending: Urology | Admitting: Urology

## 2020-05-22 ENCOUNTER — Encounter: Payer: Self-pay | Admitting: Medical Oncology

## 2020-05-22 ENCOUNTER — Ambulatory Visit
Admission: RE | Admit: 2020-05-22 | Discharge: 2020-05-22 | Disposition: A | Discharge status: Home / Self Care | Payer: PPO | Source: Ambulatory Visit | Attending: Urology | Admitting: Urology

## 2020-05-22 ENCOUNTER — Ambulatory Visit
Admission: RE | Admit: 2020-05-22 | Discharge: 2020-05-22 | Disposition: A | Discharge status: Home / Self Care | Payer: PPO | Source: Ambulatory Visit | Attending: Radiation Oncology | Admitting: Radiation Oncology

## 2020-05-22 ENCOUNTER — Encounter (HOSPITAL_COMMUNITY)
Admission: RE | Admit: 2020-05-22 | Discharge: 2020-05-22 | Disposition: A | Discharge status: Home / Self Care | Payer: PPO | Source: Ambulatory Visit | Attending: Urology | Admitting: Urology

## 2020-05-22 ENCOUNTER — Other Ambulatory Visit: Payer: Self-pay

## 2020-05-22 DIAGNOSIS — Z510 Encounter for antineoplastic radiation therapy: Secondary | ICD-10-CM | POA: Diagnosis not present

## 2020-05-22 DIAGNOSIS — Z01818 Encounter for other preprocedural examination: Secondary | ICD-10-CM

## 2020-05-22 DIAGNOSIS — C61 Malignant neoplasm of prostate: Secondary | ICD-10-CM | POA: Diagnosis not present

## 2020-05-22 DIAGNOSIS — I4891 Unspecified atrial fibrillation: Secondary | ICD-10-CM | POA: Diagnosis not present

## 2020-05-22 NOTE — Progress Notes (Signed)
  Radiation Oncology         [phone] ________________________________  Name: Brandon Maddox MRN: [id number]  Date: 05/22/2020  DOB: [birth year]  SIMULATION AND TREATMENT PLANNING NOTE PUBIC ARCH STUDY  HC:WCBJS, Carloyn Manner, MD  Cleon Gustin, MD  DIAGNOSIS: 70 y.o. gentleman with Stage T1c adenocarcinoma of the prostate with Gleason score of 3+3, and PSA of 5.1.  Oncology History  Prostate cancer ([place])  03/12/2020 Cancer Staging   Staging form: Prostate, AJCC 8th Edition - Clinical stage from 03/12/2020: Stage I (cT1c, cN0, cM0, PSA: 5.1, Grade Group: 1) - Signed by Freeman Caldron, PA-C on 04/29/2020   03/26/2020 Initial Diagnosis   Prostate cancer ([place])       ICD-10-CM   1. Prostate cancer ([place])  C61     COMPLEX SIMULATION:  The patient presented today for evaluation for possible prostate seed implant. He was brought to the radiation planning suite and placed supine on the CT couch. A 3-dimensional image study set was obtained in upload to the planning computer. There, on each axial slice, I contoured the prostate gland. Then, using three-dimensional radiation planning tools I reconstructed the prostate in view of the structures from the transperineal needle pathway to assess for possible pubic arch interference. In doing so, I did not appreciate any pubic arch interference. Also, the patient's prostate volume was estimated based on the drawn structure. The volume was 34 cc.  Given the pubic arch appearance and prostate volume, patient remains a good candidate to proceed with prostate seed implant.  He does have a penile prosthesis, but, this is also not in the pathway of the needles.  Today, he freely provided informed written consent to proceed.    PLAN: The patient will undergo prostate seed implant.   ________________________________  Sheral Apley. Tammi Klippel, M.D.

## 2020-06-09 DIAGNOSIS — C61 Malignant neoplasm of prostate: Secondary | ICD-10-CM | POA: Diagnosis not present

## 2020-06-09 DIAGNOSIS — I4821 Permanent atrial fibrillation: Secondary | ICD-10-CM | POA: Diagnosis not present

## 2020-06-09 DIAGNOSIS — F325 Major depressive disorder, single episode, in full remission: Secondary | ICD-10-CM | POA: Diagnosis not present

## 2020-06-17 ENCOUNTER — Telehealth: Payer: Self-pay | Admitting: *Deleted

## 2020-06-17 NOTE — Telephone Encounter (Signed)
RETURNED PATIENT'S PHONE CALL, SPOKE WITH PATIENT. ?

## 2020-06-19 ENCOUNTER — Encounter (HOSPITAL_BASED_OUTPATIENT_CLINIC_OR_DEPARTMENT_OTHER): Payer: Self-pay | Admitting: Urology

## 2020-06-19 ENCOUNTER — Other Ambulatory Visit: Payer: Self-pay

## 2020-06-19 NOTE — Progress Notes (Signed)
Spoke w/ via phone for pre-op interview---pt Lab needs dos----   none            Lab results------has lab appt 06-23-2020 945 for cbc cmp pt ptt COVID test ------06-23-2020 1100 Arrive at -------158 am 06-26-2020 NPO after MN NO Solid Food.  Clear liquids from MN until---545 am then npo Medications to take morning of surgery -----metorpolol succinate, allopurinol, bupropion, omeprazole, tamsulosin Diabetic medication -----n/a Patient Special Instructions -----fleets enema am of surgery Pre-Op special Istructions -----none Patient verbalized understanding of instructions that were given at this phone interview. Patient denies shortness of breath, chest pain, fever, cough at this phone interview.  Anesthesia Review:hx of afib, polycythemia vera, osa with cpap prostate cancer  PCP:roy fagan Cardiologist : dr Domenic Polite 05-09-2019 epic (pt stated calling for yearly follow up cardiology appointment) Chest x-ray :05-22-2020 EKG :05-22-2020 epic Echo :12-16-2015 epic Stress test:none Cardiac Cath : none Activity level: lives alone does own house and yard work Sleep Study/ CPAP :uses cpcp set on 5 to 15 lov dr Ander Slade pulmonary 05-31-2018 epic Blood Thinner/ Instructions /Last Dose:n/a ASA / Instructions/ Last Dose : pt to stay on 81 mg aspirin per dr Alyson Ingles last dose will be day before surgery Oncology lov 02-06-2020 epic dr Delton Coombes

## 2020-06-23 ENCOUNTER — Encounter (HOSPITAL_COMMUNITY)
Admission: RE | Admit: 2020-06-23 | Discharge: 2020-06-23 | Disposition: A | Discharge status: Home / Self Care | Payer: PPO | Source: Ambulatory Visit | Attending: Urology | Admitting: Urology

## 2020-06-23 ENCOUNTER — Other Ambulatory Visit (HOSPITAL_COMMUNITY): Payer: PPO

## 2020-06-23 ENCOUNTER — Other Ambulatory Visit: Payer: Self-pay

## 2020-06-23 ENCOUNTER — Other Ambulatory Visit (HOSPITAL_COMMUNITY)
Admission: RE | Admit: 2020-06-23 | Discharge: 2020-06-23 | Disposition: A | Discharge status: Home / Self Care | Payer: PPO | Source: Ambulatory Visit | Attending: Urology | Admitting: Urology

## 2020-06-23 DIAGNOSIS — Z20822 Contact with and (suspected) exposure to covid-19: Secondary | ICD-10-CM | POA: Insufficient documentation

## 2020-06-23 DIAGNOSIS — Z01812 Encounter for preprocedural laboratory examination: Secondary | ICD-10-CM | POA: Insufficient documentation

## 2020-06-23 LAB — COMPREHENSIVE METABOLIC PANEL
ALT: 16 U/L (ref 0–44)
AST: 17 U/L (ref 15–41)
Albumin: 4 g/dL (ref 3.5–5.0)
Alkaline Phosphatase: 62 U/L (ref 38–126)
Anion gap: 7 (ref 5–15)
BUN: 20 mg/dL (ref 8–23)
CO2: 23 mmol/L (ref 22–32)
Calcium: 9.3 mg/dL (ref 8.9–10.3)
Chloride: 108 mmol/L (ref 98–111)
Creatinine, Ser: 1.08 mg/dL (ref 0.61–1.24)
GFR, Estimated: >60 mL/min (ref >60)
Glucose, Bld: 87 mg/dL (ref 70–99)
Potassium: 4.4 mmol/L (ref 3.5–5.1)
Sodium: 138 mmol/L (ref 135–145)
Total Bilirubin: 0.9 mg/dL (ref 0.3–1.2)
Total Protein: 6.9 g/dL (ref 6.5–8.1)

## 2020-06-23 LAB — CBC
HCT: 48.3 % (ref 39.0–52.0)
Hemoglobin: 15.2 g/dL (ref 13.0–17.0)
MCH: 26.6 pg (ref 26.0–34.0)
MCHC: 31.5 g/dL (ref 30.0–36.0)
MCV: 84.4 fL (ref 80.0–100.0)
Platelets: 217 10*3/uL (ref 150–400)
RBC: 5.72 MIL/uL (ref 4.22–5.81)
RDW: 17.2 % — ABNORMAL HIGH (ref 11.5–15.5)
WBC: 6.3 10*3/uL (ref 4.0–10.5)
nRBC: 0 % (ref 0.0–0.2)

## 2020-06-23 LAB — APTT: aPTT: 31 seconds (ref 24–36)

## 2020-06-23 LAB — PROTIME-INR
INR: 1.1 (ref 0.8–1.2)
Prothrombin Time: 14.1 seconds (ref 11.4–15.2)

## 2020-06-23 LAB — SARS CORONAVIRUS 2 (TAT 6-24 HRS): SARS Coronavirus 2: NEGATIVE

## 2020-06-25 ENCOUNTER — Telehealth: Payer: Self-pay | Admitting: *Deleted

## 2020-06-25 NOTE — Telephone Encounter (Signed)
CALLED PATIENT TO REMIND OF IMPLANT FOR 06-26-20, LVM FOR A RETURN CALL

## 2020-06-25 NOTE — Anesthesia Preprocedure Evaluation (Addendum)
Anesthesia Evaluation  Patient identified by MRN, date of birth, ID band Patient awake    Reviewed: Allergy & Precautions, NPO status , Patient's Chart, lab work & pertinent test results  Airway Mallampati: II  TM Distance: >3 FB Neck ROM: Full    Dental  (+) Dental Advisory Given   Pulmonary sleep apnea , former smoker,    Pulmonary exam normal breath sounds clear to auscultation       Cardiovascular hypertension, Pt. on home beta blockers + dysrhythmias Atrial Fibrillation  Rhythm:Irregular Rate:Normal     Neuro/Psych PSYCHIATRIC DISORDERS Depression negative neurological ROS     GI/Hepatic Neg liver ROS, GERD  ,  Endo/Other  diabetes  Renal/GU negative Renal ROS     Musculoskeletal negative musculoskeletal ROS (+)   Abdominal (+) + obese,   Peds  Hematology negative hematology ROS (+)   Anesthesia Other Findings   Reproductive/Obstetrics                           Anesthesia Physical Anesthesia Plan  ASA: III  Anesthesia Plan: General   Post-op Pain Management:    Induction: Intravenous  PONV Risk Score and Plan: 3 and Ondansetron, Dexamethasone, Midazolam, Treatment may vary due to age or medical condition and Scopolamine patch - Pre-op  Airway Management Planned: LMA  Additional Equipment: None  Intra-op Plan:   Post-operative Plan: Extubation in OR  Informed Consent: I have reviewed the patients History and Physical, chart, labs and discussed the procedure including the risks, benefits and alternatives for the proposed anesthesia with the patient or authorized representative who has indicated his/her understanding and acceptance.     Dental advisory given  Plan Discussed with: CRNA  Anesthesia Plan Comments: (Pt > 1 yr from cardiology eval. 04/2019 given CHADS score of 1 and placed on ASA. Discussed with Dr. Terance Hart and patient at length. I stated that I cannot  quantify risk of stroke, and ideally cardiology would have seen him. I do not believe delaying surgery for anticoagulation would be indicated. The pt understands the risks and wishes to proceed.)      Anesthesia Quick Evaluation

## 2020-06-26 ENCOUNTER — Ambulatory Visit (HOSPITAL_BASED_OUTPATIENT_CLINIC_OR_DEPARTMENT_OTHER)
Admission: RE | Admit: 2020-06-26 | Discharge: 2020-06-26 | Disposition: A | Discharge status: Home / Self Care | Payer: PPO | Source: Ambulatory Visit | Attending: Urology | Admitting: Urology

## 2020-06-26 ENCOUNTER — Ambulatory Visit (HOSPITAL_BASED_OUTPATIENT_CLINIC_OR_DEPARTMENT_OTHER): Payer: PPO | Admitting: Anesthesiology

## 2020-06-26 ENCOUNTER — Encounter (HOSPITAL_BASED_OUTPATIENT_CLINIC_OR_DEPARTMENT_OTHER): Payer: Self-pay | Admitting: Urology

## 2020-06-26 ENCOUNTER — Encounter (HOSPITAL_BASED_OUTPATIENT_CLINIC_OR_DEPARTMENT_OTHER)
Admission: RE | Disposition: A | Discharge status: Home / Self Care | Payer: Self-pay | Source: Ambulatory Visit | Attending: Urology

## 2020-06-26 ENCOUNTER — Ambulatory Visit (HOSPITAL_COMMUNITY): Payer: PPO

## 2020-06-26 DIAGNOSIS — C61 Malignant neoplasm of prostate: Secondary | ICD-10-CM | POA: Diagnosis not present

## 2020-06-26 DIAGNOSIS — Z803 Family history of malignant neoplasm of breast: Secondary | ICD-10-CM | POA: Insufficient documentation

## 2020-06-26 DIAGNOSIS — Z7982 Long term (current) use of aspirin: Secondary | ICD-10-CM | POA: Insufficient documentation

## 2020-06-26 DIAGNOSIS — K219 Gastro-esophageal reflux disease without esophagitis: Secondary | ICD-10-CM | POA: Diagnosis not present

## 2020-06-26 DIAGNOSIS — Z880 Allergy status to penicillin: Secondary | ICD-10-CM | POA: Diagnosis not present

## 2020-06-26 DIAGNOSIS — Z87891 Personal history of nicotine dependence: Secondary | ICD-10-CM | POA: Insufficient documentation

## 2020-06-26 DIAGNOSIS — Z79899 Other long term (current) drug therapy: Secondary | ICD-10-CM | POA: Diagnosis not present

## 2020-06-26 DIAGNOSIS — I10 Essential (primary) hypertension: Secondary | ICD-10-CM | POA: Diagnosis not present

## 2020-06-26 DIAGNOSIS — E785 Hyperlipidemia, unspecified: Secondary | ICD-10-CM | POA: Diagnosis not present

## 2020-06-26 DIAGNOSIS — E119 Type 2 diabetes mellitus without complications: Secondary | ICD-10-CM | POA: Diagnosis not present

## 2020-06-26 DIAGNOSIS — I4891 Unspecified atrial fibrillation: Secondary | ICD-10-CM | POA: Insufficient documentation

## 2020-06-26 HISTORY — PX: CYSTOSCOPY: SHX5120

## 2020-06-26 HISTORY — PX: RADIOACTIVE SEED IMPLANT: SHX5150

## 2020-06-26 HISTORY — DX: Presence of spectacles and contact lenses: Z97.3

## 2020-06-26 HISTORY — PX: SPACE OAR INSTILLATION: SHX6769

## 2020-06-26 HISTORY — DX: Sleep apnea, unspecified: G47.30

## 2020-06-26 SURGERY — CYSTOSCOPY
Anesthesia: General | Site: Urethra

## 2020-06-26 MED ORDER — AMISULPRIDE (ANTIEMETIC) 5 MG/2ML IV SOLN: 10.0000 mg | Freq: Once | INTRAVENOUS | Status: DC | PRN

## 2020-06-26 MED ORDER — EPHEDRINE 5 MG/ML INJ
INTRAVENOUS | Status: AC
  Filled 2020-06-26: qty 10

## 2020-06-26 MED ORDER — ONDANSETRON HCL 4 MG/2ML IJ SOLN
INTRAMUSCULAR | Status: AC
  Filled 2020-06-26: qty 2

## 2020-06-26 MED ORDER — DEXAMETHASONE SODIUM PHOSPHATE 10 MG/ML IJ SOLN
INTRAMUSCULAR | Status: DC | PRN
  Administered 2020-06-26: 10 mg via INTRAVENOUS

## 2020-06-26 MED ORDER — FENTANYL CITRATE (PF) 100 MCG/2ML IJ SOLN
INTRAMUSCULAR | Status: AC
  Filled 2020-06-26: qty 2

## 2020-06-26 MED ORDER — MIDAZOLAM HCL 2 MG/2ML IJ SOLN
INTRAMUSCULAR | Status: AC
  Filled 2020-06-26: qty 2

## 2020-06-26 MED ORDER — DEXAMETHASONE SODIUM PHOSPHATE 10 MG/ML IJ SOLN
INTRAMUSCULAR | Status: AC
  Filled 2020-06-26: qty 1

## 2020-06-26 MED ORDER — MIDAZOLAM HCL 2 MG/2ML IJ SOLN
INTRAMUSCULAR | Status: DC | PRN
  Administered 2020-06-26: 2 mg via INTRAVENOUS

## 2020-06-26 MED ORDER — PHENYLEPHRINE 40 MCG/ML (10ML) SYRINGE FOR IV PUSH (FOR BLOOD PRESSURE SUPPORT)
PREFILLED_SYRINGE | INTRAVENOUS | Status: DC | PRN
  Administered 2020-06-26 (×4): 80 ug via INTRAVENOUS
  Administered 2020-06-26: 120 ug via INTRAVENOUS
  Administered 2020-06-26: 40 ug via INTRAVENOUS
  Administered 2020-06-26 (×2): 80 ug via INTRAVENOUS

## 2020-06-26 MED ORDER — EPHEDRINE SULFATE-NACL 50-0.9 MG/10ML-% IV SOSY
PREFILLED_SYRINGE | INTRAVENOUS | Status: DC | PRN
  Administered 2020-06-26 (×3): 10 mg via INTRAVENOUS
  Administered 2020-06-26: 5 mg via INTRAVENOUS

## 2020-06-26 MED ORDER — PROPOFOL 10 MG/ML IV BOLUS
INTRAVENOUS | Status: DC | PRN
  Administered 2020-06-26: 150 mg via INTRAVENOUS
  Administered 2020-06-26: 20 mg via INTRAVENOUS

## 2020-06-26 MED ORDER — KETOROLAC TROMETHAMINE 30 MG/ML IJ SOLN
INTRAMUSCULAR | Status: DC | PRN
  Administered 2020-06-26: 30 mg via INTRAVENOUS

## 2020-06-26 MED ORDER — KETOROLAC TROMETHAMINE 30 MG/ML IJ SOLN
INTRAMUSCULAR | Status: AC
  Filled 2020-06-26: qty 1

## 2020-06-26 MED ORDER — SODIUM CHLORIDE (PF) 0.9 % IJ SOLN
INTRAMUSCULAR | Status: DC | PRN
  Administered 2020-06-26: 10 mL via INTRAVENOUS

## 2020-06-26 MED ORDER — OXYCODONE HCL 5 MG PO TABS
5.0000 mg | ORAL_TABLET | Freq: Once | ORAL | Status: AC | PRN
  Administered 2020-06-26: 5 mg via ORAL

## 2020-06-26 MED ORDER — PROPOFOL 10 MG/ML IV BOLUS
INTRAVENOUS | Status: AC
  Filled 2020-06-26: qty 40

## 2020-06-26 MED ORDER — FENTANYL CITRATE (PF) 100 MCG/2ML IJ SOLN
INTRAMUSCULAR | Status: DC | PRN
  Administered 2020-06-26 (×2): 50 ug via INTRAVENOUS

## 2020-06-26 MED ORDER — CIPROFLOXACIN IN D5W 400 MG/200ML IV SOLN
INTRAVENOUS | Status: AC
  Filled 2020-06-26: qty 200

## 2020-06-26 MED ORDER — PHENYLEPHRINE 40 MCG/ML (10ML) SYRINGE FOR IV PUSH (FOR BLOOD PRESSURE SUPPORT)
PREFILLED_SYRINGE | INTRAVENOUS | Status: AC
  Filled 2020-06-26: qty 10

## 2020-06-26 MED ORDER — GENTAMICIN SULFATE 40 MG/ML IJ SOLN
5.0000 mg/kg | INTRAVENOUS | Status: AC
  Administered 2020-06-26: 430 mg via INTRAVENOUS
  Filled 2020-06-26: qty 10.75

## 2020-06-26 MED ORDER — HYDROMORPHONE HCL 1 MG/ML IJ SOLN: 0.2500 mg | INTRAMUSCULAR | Status: DC | PRN

## 2020-06-26 MED ORDER — OXYCODONE HCL 5 MG PO TABS
ORAL_TABLET | ORAL | Status: AC
  Filled 2020-06-26: qty 1

## 2020-06-26 MED ORDER — ONDANSETRON HCL 4 MG/2ML IJ SOLN
INTRAMUSCULAR | Status: DC | PRN
  Administered 2020-06-26: 4 mg via INTRAVENOUS

## 2020-06-26 MED ORDER — FLEET ENEMA 7-19 GM/118ML RE ENEM: 1.0000 | ENEMA | Freq: Once | RECTAL | Status: DC

## 2020-06-26 MED ORDER — OXYCODONE HCL 5 MG/5ML PO SOLN: 5.0000 mg | Freq: Once | ORAL | Status: AC | PRN

## 2020-06-26 MED ORDER — LACTATED RINGERS IV SOLN: INTRAVENOUS | Status: DC

## 2020-06-26 MED ORDER — LIDOCAINE 2% (20 MG/ML) 5 ML SYRINGE
INTRAMUSCULAR | Status: DC | PRN
  Administered 2020-06-26: 100 mg via INTRAVENOUS

## 2020-06-26 MED ORDER — HYDROCODONE-ACETAMINOPHEN 5-325 MG PO TABS
1.0000 | ORAL_TABLET | Freq: Four times a day (QID) | ORAL | 0 refills | Status: DC | PRN
Start: 2020-06-26 — End: 2021-04-23

## 2020-06-26 MED ORDER — SODIUM CHLORIDE 0.9 % IV SOLN
INTRAVENOUS | Status: AC | PRN
  Administered 2020-06-26: 200 mL

## 2020-06-26 MED ORDER — IOHEXOL 300 MG/ML  SOLN
INTRAMUSCULAR | Status: DC | PRN
  Administered 2020-06-26: 7 mL via URETHRAL

## 2020-06-26 MED ORDER — GENTAMICIN SULFATE 40 MG/ML IJ SOLN: 5.0000 mg/kg | INTRAVENOUS | Status: DC

## 2020-06-26 MED ORDER — CIPROFLOXACIN IN D5W 400 MG/200ML IV SOLN
400.0000 mg | INTRAVENOUS | Status: AC
  Administered 2020-06-26: 400 mg via INTRAVENOUS

## 2020-06-26 MED ORDER — LIDOCAINE 2% (20 MG/ML) 5 ML SYRINGE
INTRAMUSCULAR | Status: AC
  Filled 2020-06-26: qty 5

## 2020-06-26 SURGICAL SUPPLY — 36 items
BAG DRN RND TRDRP ANRFLXCHMBR (UROLOGICAL SUPPLIES) ×3
BAG URINE DRAIN 2000ML AR STRL (UROLOGICAL SUPPLIES) ×4 IMPLANT
BLADE CLIPPER SENSICLIP SURGIC (BLADE) ×4 IMPLANT
CATH FOLEY 2WAY SLVR  5CC 14FR (CATHETERS) ×4
CATH FOLEY 2WAY SLVR  5CC 16FR (CATHETERS) ×4
CATH FOLEY 2WAY SLVR 5CC 14FR (CATHETERS) ×1 IMPLANT
CATH FOLEY 2WAY SLVR 5CC 16FR (CATHETERS) ×6 IMPLANT
CATH ROBINSON RED A/P 20FR (CATHETERS) ×4 IMPLANT
CLOTH BEACON ORANGE TIMEOUT ST (SAFETY) ×4 IMPLANT
CNTNR URN SCR LID CUP LEK RST (MISCELLANEOUS) ×6 IMPLANT
CONT SPEC 4OZ STRL OR WHT (MISCELLANEOUS) ×8
COVER BACK TABLE 60X90IN (DRAPES) ×4 IMPLANT
COVER MAYO STAND STRL (DRAPES) ×4 IMPLANT
DRAPE C-ARM 35X43 STRL (DRAPES) ×4 IMPLANT
DRSG TEGADERM 4X4.75 (GAUZE/BANDAGES/DRESSINGS) ×5 IMPLANT
DRSG TEGADERM 8X12 (GAUZE/BANDAGES/DRESSINGS) ×7 IMPLANT
GAUZE SPONGE 4X4 12PLY STRL (GAUZE/BANDAGES/DRESSINGS) ×2 IMPLANT
GLOVE SURG ENC MOIS LTX SZ6.5 (GLOVE) ×10 IMPLANT
GLOVE SURG ENC MOIS LTX SZ8 (GLOVE) ×5 IMPLANT
GLOVE SURG ORTHO LTX SZ8.5 (GLOVE) ×6 IMPLANT
GLOVE SURG UNDER POLY LF SZ7 (GLOVE) ×2 IMPLANT
GOWN STRL REUS W/TWL XL LVL3 (GOWN DISPOSABLE) ×7 IMPLANT
HOLDER FOLEY CATH W/STRAP (MISCELLANEOUS) ×4 IMPLANT
I seed AgX100 ×192 IMPLANT
IMPL SPACEOAR VUE SYSTEM (Spacer) IMPLANT
IMPLANT SPACEOAR VUE SYSTEM (Spacer) ×4 IMPLANT
IV NS 1000ML (IV SOLUTION) ×4
IV NS 1000ML BAXH (IV SOLUTION) ×3 IMPLANT
KIT TURNOVER CYSTO (KITS) ×4 IMPLANT
MARKER SKIN DUAL TIP RULER LAB (MISCELLANEOUS) ×4 IMPLANT
PACK CYSTO (CUSTOM PROCEDURE TRAY) ×4 IMPLANT
SURGILUBE 2OZ TUBE FLIPTOP (MISCELLANEOUS) ×4 IMPLANT
SYR 10ML LL (SYRINGE) ×8 IMPLANT
TOWEL OR 17X26 10 PK STRL BLUE (TOWEL DISPOSABLE) ×7 IMPLANT
UNDERPAD 30X36 HEAVY ABSORB (UNDERPADS AND DIAPERS) ×8 IMPLANT
WATER STERILE IRR 500ML POUR (IV SOLUTION) ×4 IMPLANT

## 2020-06-26 NOTE — Op Note (Signed)
PRE-OPERATIVE DIAGNOSIS:  Adenocarcinoma of the prostate  POST-OPERATIVE DIAGNOSIS:  Same  PROCEDURE:  Procedure(s): 1. I-125 radioactive seed implantation 2. Cystoscopy 3. SpaceOAR placement  SURGEON:  Surgeon(s): Nicolette Bang, MD  Radiation oncologist: Tyler Pita, MD  ANESTHESIA:  General  EBL:  Minimal  DRAINS: 26 French Foley catheter  INDICATION: Brandon Maddox is a 70 year old with a history of T1c prostate cancer. After discussing treatment options he has elected to proceed with brachytherapy  Description of procedure: After informed consent the patient was brought to the major OR, placed on the table and administered general anesthesia. He was then moved to the modified lithotomy position with his perineum perpendicular to the floor. His perineum and genitalia were then sterilely prepped. An official timeout was then performed. A 16 French Foley catheter was then placed in the bladder and filled with dilute contrast, a rectal tube was placed in the rectum and the transrectal ultrasound probe was placed in the rectum and affixed to the stand. He was then sterilely draped.  Real time ultrasonography was used along with the seed planning software [employer] Prostate vs. 4.2.21. This was used to develop the seed plan including the number of needles as well as number of seeds required for complete and adequate coverage. Real-time ultrasonography was then used along with the previously developed plan and the Nucletron device to implant a total of 64 seeds using 18 needles. This proceeded without difficulty or complication.  We then proceeded to mix the SpaceOAR using the kit supplied from the manufacturer. Once this was complete we placed a sinal needle into the perirectal fat between the rectum and the prostate. Once this was accomplished we injected 2cc of normal saline to hydrodissect the plain. We then instilled the the SpaceOAR through the spinal needle and noted good  distribution in the perirectal fat.    A Foley catheter was then removed as well as the transrectal ultrasound probe and rectal probe. Flexible cystoscopy was then performed using the 17 French flexible scope which revealed a normal urethra throughout its length down to the sphincter which appeared intact. The prostatic urethra revealed bilobar hypertrophy but no evidence of obstruction, seeds, spacers or lesions. The bladder was then entered and fully and systematically inspected. The ureteral orifices were noted to be of normal configuration and position. The mucosa revealed no evidence of tumors. There were also no stones identified within the bladder. I noted no seeds or spacers on the floor of the bladder and retroflexion of the scope revealed no seeds protruding from the base of the prostate.  The cystoscope was then removed and a new 91 French Foley catheter was then inserted and the balloon was filled with 10 cc of sterile water. This was connected to closed system drainage and the patient was awakened and taken to recovery room in stable and satisfactory condition. He tolerated procedure well and there were no intraoperative complications.

## 2020-06-26 NOTE — Anesthesia Procedure Notes (Signed)
Procedure Name: LMA Insertion Date/Time: 06/26/2020 8:45 AM Performed by: Mechele Claude, CRNA Pre-anesthesia Checklist: Patient identified, Emergency Drugs available, Suction available and Patient being monitored Patient Re-evaluated:Patient Re-evaluated prior to induction Oxygen Delivery Method: Circle system utilized Preoxygenation: Pre-oxygenation with 100% oxygen Induction Type: IV induction Ventilation: Mask ventilation without difficulty LMA: LMA inserted LMA Size: 5.0 Number of attempts: 1 Placement Confirmation: positive ETCO2 Tube secured with: Tape Dental Injury: Teeth and Oropharynx as per pre-operative assessment

## 2020-06-26 NOTE — Progress Notes (Signed)
  Radiation Oncology         [phone] ________________________________  Name: JOVIN FESTER MRN: [id number]  Date: 06/26/2020  DOB: 28-Jul-1950       Prostate Seed Implant  HG:[id number], Carloyn Manner, MD  No ref. provider found  DIAGNOSIS:  70 y.o. gentleman with Stage T1c adenocarcinoma of the prostate with Gleason score of 3+3, and PSA of 5.1.  Oncology History  Prostate cancer ([place])  03/12/2020 Cancer Staging   Staging form: Prostate, AJCC 8th Edition - Clinical stage from 03/12/2020: Stage I (cT1c, cN0, cM0, PSA: 5.1, Grade Group: 1) - Signed by Freeman Caldron, PA-C on 04/29/2020   03/26/2020 Initial Diagnosis   Prostate cancer ([place])       ICD-10-CM   1. Prostate cancer ([place])  C61 DISCONTINUED: gentamicin (GARAMYCIN) 520 mg in dextrose 5 % 100 mL IVPB    PROCEDURE: Insertion of radioactive I-125 seeds into the prostate gland.  RADIATION DOSE: 145 Gy, definitive therapy.  TECHNIQUE: DEJON LUKAS was brought to the operating room with the urologist. He was placed in the dorsolithotomy position. He was catheterized and a rectal tube was inserted. The perineum was shaved, prepped and draped. The ultrasound probe was then introduced into the rectum to see the prostate gland.  TREATMENT DEVICE: A needle grid was attached to the ultrasound probe stand and anchor needles were placed.  3D PLANNING: The prostate was imaged in 3D using a sagittal sweep of the prostate probe. These images were transferred to the planning computer. There, the prostate, urethra and rectum were defined on each axial reconstructed image. Then, the software created an optimized 3D plan and a few seed positions were adjusted. The quality of the plan was reviewed using [facility] information for the target and the following two organs at risk:  Urethra and Rectum.  Then the accepted plan was printed and handed off to the radiation therapist.  Under my supervision, the custom loading of the seeds and spacers was carried out and  loaded into sealed vicryl sleeves.  These pre-loaded needles were then placed into the needle holder.Marland Kitchen  PROSTATE VOLUME STUDY:  Using transrectal ultrasound the volume of the prostate was verified to be 33.7 cc.  SPECIAL TREATMENT PROCEDURE/SUPERVISION AND HANDLING: The pre-loaded needles were then delivered under sagittal guidance. A total of 18 needles were used to deposit 64 seeds in the prostate gland. The individual seed activity was 0.415 mCi.  SpaceOAR:  Yes  COMPLEX SIMULATION: At the end of the procedure, an anterior radiograph of the pelvis was obtained to document seed positioning and count. Cystoscopy was performed to check the urethra and bladder.  MICRODOSIMETRY: At the end of the procedure, the patient was emitting 0.046 mR/hr at 1 meter. Accordingly, he was considered safe for hospital discharge.  PLAN: The patient will return to the radiation oncology clinic for post implant CT dosimetry in three weeks.   ________________________________  Sheral Apley Tammi Klippel, M.D.

## 2020-06-26 NOTE — Discharge Instructions (Signed)
Indwelling Urinary Catheter Care, Adult An indwelling urinary catheter is a thin tube that is put into your bladder. The tube helps to drain pee (urine) out of your body. The tube goes in through your urethra. Your urethra is where pee comes out of your body. Your pee will come out through the catheter, then it will go into a bag (drainage bag). Take good care of your catheter so it will work well. How to wear your catheter and bag Supplies needed  Sticky tape (adhesive tape) or a leg strap.  Alcohol wipe or soap and water (if you use tape).  A clean towel (if you use tape).  Large overnight bag.  Smaller bag (leg bag). Wearing your catheter Attach your catheter to your leg with tape or a leg strap.  Make sure the catheter is not pulled tight.  If a leg strap gets wet, take it off and put on a dry strap.  If you use tape to hold the bag on your leg: 1. Use an alcohol wipe or soap and water to wash your skin where the tape made it sticky before. 2. Use a clean towel to pat-dry that skin. 3. Use new tape to make the bag stay on your leg. Wearing your bags You should have been given a large overnight bag.  You may wear the overnight bag in the day or night.  Always have the overnight bag lower than your bladder.  Do not let the bag touch the floor.  Before you go to sleep, put a clean plastic bag in a wastebasket. Then hang the overnight bag inside the wastebasket. You should also have a smaller leg bag that fits under your clothes.  Always wear the leg bag below your knee.  Do not wear your leg bag at night. How to care for your skin and catheter Supplies needed  A clean washcloth.  Water and mild soap.  A clean towel. Caring for your skin and catheter  Clean the skin around your catheter every day: 1. Wash your hands with soap and water. 2. Wet a clean washcloth in warm water and mild soap. 3. Clean the skin around your urethra.  If you are male:  Gently  spread the folds of skin around your vagina (labia).  With the washcloth in your other hand, wipe the inner side of your labia on each side. Wipe from front to back.  If you are male:  Pull back any skin that covers the end of your penis (foreskin).  With the washcloth in your other hand, wipe your penis in small circles. Start wiping at the tip of your penis, then move away from the catheter.  Move the foreskin back in place, if needed. 4. With your free hand, hold the catheter close to where it goes into your body.  Keep holding the catheter during cleaning so it does not get pulled out. 5. With the washcloth in your other hand, clean the catheter.  Only wipe downward on the catheter.  Do not wipe upward toward your body. Doing this may push germs into your urethra and cause infection. 6. Use a clean towel to pat-dry the catheter and the skin around it. Make sure to wipe off all soap. 7. Wash your hands with soap and water.  Shower every day. Do not take baths.  Do not use cream, ointment, or lotion on the area where the catheter goes into your body, unless your doctor tells you to.  Do not   use powders, sprays, or lotions on your genital area.  Check your skin around the catheter every day for signs of infection. Check for: ? Redness, swelling, or pain. ? Fluid or blood. ? Warmth. ? Pus or a bad smell.      How to empty the bag Supplies needed  Rubbing alcohol.  Gauze pad or cotton ball.  Tape or a leg strap. Emptying the bag Pour the pee out of your bag when it is ?- full, or at least 2-3 times a day. Do this for your overnight bag and your leg bag. 1. Wash your hands with soap and water. 2. Separate (detach) the bag from your leg. 3. Hold the bag over the toilet or a clean pail. Keep the bag lower than your hips and bladder. This is so the pee (urine) does not go back into the tube. 4. Open the pour spout. It is at the bottom of the bag. 5. Empty the pee into the  toilet or pail. Do not let the pour spout touch any surface. 6. Put rubbing alcohol on a gauze pad or cotton ball. 7. Use the gauze pad or cotton ball to clean the pour spout. 8. Close the pour spout. 9. Attach the bag to your leg with tape or a leg strap. 10. Wash your hands with soap and water. Follow instructions for cleaning the drainage bag:  From the product maker.  As told by your doctor. How to change the bag Supplies needed  Alcohol wipes.  A clean bag.  Tape or a leg strap. Changing the bag Replace your bag when it starts to leak, smell bad, or look dirty. 1. Wash your hands with soap and water. 2. Separate the dirty bag from your leg. 3. Pinch the catheter with your fingers so that pee does not spill out. 4. Separate the catheter tube from the bag tube where these tubes connect (at the connection valve). Do not let the tubes touch any surface. 5. Clean the end of the catheter tube with an alcohol wipe. Use a different alcohol wipe to clean the end of the bag tube. 6. Connect the catheter tube to the tube of the clean bag. 7. Attach the clean bag to your leg with tape or a leg strap. Do not make the bag tight on your leg. 8. Wash your hands with soap and water. General rules  Never pull on your catheter. Never try to take it out. Doing that can hurt you.  Always wash your hands before and after you touch your catheter or bag. Use a mild, fragrance-free soap. If you do not have soap and water, use hand sanitizer.  Always make sure there are no twists or bends (kinks) in the catheter tube.  Always make sure there are no leaks in the catheter or bag.  Drink enough fluid to keep your pee pale yellow.  Do not take baths, swim, or use a hot tub.  If you are male, wipe from front to back after you poop (have a bowel movement).   Contact a doctor if:  Your pee is cloudy.  Your pee smells worse than usual.  Your catheter gets clogged.  Your catheter  leaks.  Your bladder feels full. Get help right away if:  You have redness, swelling, or pain where the catheter goes into your body.  You have fluid, blood, pus, or a bad smell coming from the area where the catheter goes into your body.  Your skin feels   warm where the catheter goes into your body.  You have a fever.  You have pain in your: ? Belly (abdomen). ? Legs. ? Lower back. ? Bladder.  You see blood in the catheter.  Your pee is pink or red.  You feel sick to your stomach (nauseous).  You throw up (vomit).  You have chills.  Your pee is not draining into the bag.  Your catheter gets pulled out. Summary  An indwelling urinary catheter is a thin tube that is placed into the bladder to help drain pee (urine) out of the body.  The catheter is placed into the part of the body that drains pee from the bladder (urethra).  Taking good care of your catheter will keep it working properly and help prevent problems.  Always wash your hands before and after touching your catheter or bag.  Never pull on your catheter or try to take it out. This information is not intended to replace advice given to you by your health care provider. Make sure you discuss any questions you have with your health care provider. Document Revised: 07/28/2018 Document Reviewed: 11/19/2016 Elsevier Patient Education  2021 [place] Instructions   Activity:    Rest for the remainder of the day.  Do not drive or operate equipment today.  You may resume normal  activities in a few days as instructed by your physician, without risk of harmful radiation exposure to those around you, provided you follow the time and distance precautions on the Radiation Oncology Instruction Sheet.   Meals: Drink plenty of lipuids and eat light foods, such as gelatin or soup this evening .  You may return to normal meal plan tomorrow.  Return To Work: You may return to work  as instructed by [employer].  Special Instruction:   If any seeds are found, use tweezers to pick up seeds and place in a glass container of any kind and bring to your physician's office.  Call your physician if any of these symptoms occur:   Persistent or heavy bleeding  Urine stream diminishes or stops completely after catheter is removed  Fever equal to or greater than 101 degrees F  Cloudy urine with a strong foul odor  Severe pain  You may feel some burning pain and/or hesitancy when you urinate after the catheter is removed.  These symptoms may increase over the next few weeks, but should diminish within forur to six weeks.  Applying moist heat to the lower abdomen or a hot tub bath may help relieve the pain.  If the discomfort becomes severe, please call your physician for additional medications.   Post Anesthesia Home Care Instructions  Activity: Get plenty of rest for the remainder of the day. A responsible individual must stay with you for 24 hours following the procedure.  For the next 24 hours, DO NOT: -Drive a car -Paediatric nurse -Drink alcoholic beverages -Take any medication unless instructed by your physician -Make any legal decisions or sign important papers.  Meals: Start with liquid foods such as gelatin or soup. Progress to regular foods as tolerated. Avoid greasy, spicy, heavy foods. If nausea and/or vomiting occur, drink only clear liquids until the nausea and/or vomiting subsides. Call your physician if vomiting continues.  Special Instructions/Symptoms: Your throat may feel dry or sore from the anesthesia or the breathing tube placed in your throat during surgery. If this causes discomfort, gargle with warm salt water. The discomfort should disappear  within 24 hours.  No ibuprofen, Advil, Aleve, Motrin, or naproxen until after 5 pm today if needed.

## 2020-06-26 NOTE — H&P (Signed)
HPI: Brandon Maddox is a 70yo here for followup for prostate cancer. He met with Dr. Tammi Klippel and has elected to proceed with brachytherapy with [facility]. He has mild LUTS. Mild ED. No other complaints today   PMH:     Past Medical History:  Diagnosis Date  . Acid reflux   . Atrial fibrillation ([place])   . Closed right scapular fracture   . Depression   . GERD (gastroesophageal reflux disease)   . Gout   . Low testosterone   . Polycythemia vera ([place]) 01/17/2017  . Prostate cancer ([place])   . Ribs, multiple fractures     Surgical History:      Past Surgical History:  Procedure Laterality Date  . COLONOSCOPY WITH PROPOFOL N/A 06/01/2018   Procedure: COLONOSCOPY WITH PROPOFOL;  Surgeon: Daneil Dolin, MD;  [facility];  Service: Endoscopy;  Laterality: N/A;  10:45am  . HIP SURGERY Right 2010   Dr. Dennis Bast and then replacement  . LACERATION REPAIR  05/03/2012   Procedure: REPAIR MULTIPLE LACERATIONS;  Surgeon: Jerrell Belfast, MD;  Location: [place];  Service: ENT;  Laterality: N/A;  . PENILE PROSTHESIS IMPLANT    . POLYPECTOMY  06/01/2018   Procedure: POLYPECTOMY;  Surgeon: Daneil Dolin, MD;  [facility];  Service: Endoscopy;;  cecum,hepatic flexure  . 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 Yes Did it involve sudden or severe rash/hives, skin peeling, or any reaction on the inside of your mouth or nose? Unknown Did you need to seek medical attention at a hospital or doctor's office? Yes When did it last happen?Childhood reaction If all above answers are "NO", may proceed with cephalosporin use.         Medication List       Accurate as of April 30, 2020  9:52 AM. If you have any  questions, ask your nurse or doctor.        allopurinol 300 MG tablet Commonly known as: ZYLOPRIM Take 300 mg by mouth daily.   aspirin EC 81 MG tablet Take 81 mg by mouth daily.   buPROPion 150 MG 24 hr tablet Commonly known as: WELLBUTRIN XL Take 450 mg by mouth daily.   metoprolol succinate 25 MG 24 hr tablet Commonly known as: Toprol XL Take 1 tablet (25 mg total) by mouth daily.   omeprazole 20 MG tablet Commonly known as: PRILOSEC OTC Take 20 mg by mouth daily.   tamsulosin 0.4 MG Caps capsule Commonly known as: FLOMAX Take 0.4 mg by mouth daily.       Allergies:       Allergies  Allergen Reactions  . Penicillins Other (See Comments)    Mother told him throat swelled up when he took at a young age  Did it involve swelling of the face/tongue/throat, SOB, or low BP? Yes Did it involve sudden or severe rash/hives, skin peeling, or any reaction on the inside of your mouth or nose? Unknown Did you need to seek medical attention at a hospital or doctor's office? Yes When did it last happen?Childhood reaction If all above answers are "NO", may proceed with cephalosporin use.     Family  History: Family History  Problem Relation Age of Onset  . Diabetes Mother   . Breast cancer Mother   . Diabetes Father   . Colon cancer Neg Hx   . Pancreatic cancer Neg Hx   . Prostate cancer Neg Hx     Social History:  reports that he quit smoking about 17 years ago. His smoking use included cigarettes. He has a 10.00 pack-year smoking history. He has never used smokeless tobacco. He reports that he does not drink alcohol and does not use drugs.  ROS: All other review of systems were reviewed and are negative except what is noted above in HPI  Physical Exam: BP 96/66   Pulse 91   Temp 98.9 F (37.2 C)   Ht 5' 11"  (1.803 m)   Wt 228 lb (103.4 kg)   BMI 31.80 kg/m   Constitutional:  Alert and oriented, No acute distress. HEENT: [place] AT,  moist mucus membranes.  Trachea midline, no masses. Cardiovascular: No clubbing, cyanosis, or edema. Respiratory: Normal respiratory effort, no increased work of breathing. GI: Abdomen is soft, nontender, nondistended, no abdominal masses GU: No CVA tenderness.  Lymph: No cervical or inguinal lymphadenopathy. Skin: No rashes, bruises or suspicious lesions. Neurologic: Grossly intact, no focal deficits, moving all 4 extremities. Psychiatric: Normal mood and affect.  Laboratory Data: Recent Labs       Lab Results  Component Value Date   WBC 6.9 01/29/2020   HGB 16.8 01/29/2020   HCT 52.4 (H) 01/29/2020   MCV 85.2 01/29/2020   PLT 225 01/29/2020      Recent Labs       Lab Results  Component Value Date   CREATININE 1.14 01/29/2020      Recent Labs  No results found for: PSA    Recent Labs  No results found for: TESTOSTERONE    Recent Labs       Lab Results  Component Value Date   HGBA1C 5.9 (H) 04/02/2013      Urinalysis Labs (Brief)          Component Value Date/Time   COLORURINE YELLOW 09/10/2008 0630   APPEARANCEUR Clear 02/11/2020 1117   LABSPEC 1.025 09/10/2008 0630   PHURINE 5.5 09/10/2008 0630   GLUCOSEU Negative 02/11/2020 1117   HGBUR TRACE (A) 09/10/2008 0630   BILIRUBINUR Negative 02/11/2020 1117   KETONESUR NEGATIVE 09/10/2008 0630   PROTEINUR Negative 02/11/2020 1117   PROTEINUR NEGATIVE 09/10/2008 0630   UROBILINOGEN 0.2 09/10/2008 0630   NITRITE Negative 02/11/2020 1117   NITRITE NEGATIVE 09/10/2008 0630   LEUKOCYTESUR Negative 02/11/2020 1117      Recent Labs       Lab Results  Component Value Date   LABMICR Comment 02/11/2020      Pertinent Imaging:  No results found for this or any previous visit.  No results found for this or any previous visit.  No results found for this or any previous visit.  No results found for this or any previous visit.  No results found for this or any  previous visit.  No results found for this or any previous visit.  No results found for this or any previous visit.  No results found for this or any previous visit.   Assessment & Plan:    1. Prostate cancer ([place]) -Schedule for brachytherapy with [facility]. Risks/benefits/alternatives discussed

## 2020-06-26 NOTE — Transfer of Care (Signed)
Immediate Anesthesia Transfer of Care Note  Patient: DACARI BECKSTRAND  Procedure(s) Performed: Procedure(s) (LRB): CYSTOSCOPY (N/A) SPACE OAR INSTILLATION (N/A) RADIOACTIVE SEED IMPLANT/BRACHYTHERAPY IMPLANT (N/A)  Patient Location: PACU  Anesthesia Type: General  Level of Consciousness: awake, alert  and oriented  Airway & Oxygen Therapy: Patient Spontanous Breathing and Patient connected to nasal cannula oxygen  Post-op Assessment: Report given to PACU RN and Post -op Vital signs reviewed and stable  Post vital signs: Reviewed and stable  Complications: No apparent anesthesia complications Last Vitals:  Vitals Value Taken Time  BP 109/94 06/26/20 1058  Temp 36.7 C 06/26/20 1100  Pulse 80 06/26/20 1100  Resp 15 06/26/20 1100  SpO2 97 % 06/26/20 1100  Vitals shown include unvalidated device data.  Last Pain:  Vitals:   06/26/20 0650  TempSrc: Oral  PainSc: 0-No pain      Patients Stated Pain Goal: 8 (72/09/47 0962)  Complications: No complications documented.

## 2020-06-27 ENCOUNTER — Encounter (HOSPITAL_BASED_OUTPATIENT_CLINIC_OR_DEPARTMENT_OTHER): Payer: Self-pay | Admitting: Urology

## 2020-06-27 NOTE — Anesthesia Postprocedure Evaluation (Signed)
Anesthesia Post Note  Patient: Brandon Maddox  Procedure(s) Performed: CYSTOSCOPY (N/A Urethra) SPACE OAR INSTILLATION (N/A Rectum) RADIOACTIVE SEED IMPLANT/BRACHYTHERAPY IMPLANT (N/A Prostate)     Patient location during evaluation: PACU Anesthesia Type: General Level of consciousness: sedated and patient cooperative Pain management: pain level controlled Vital Signs Assessment: post-procedure vital signs reviewed and stable Respiratory status: spontaneous breathing Cardiovascular status: stable Anesthetic complications: no   No complications documented.  Last Vitals:  Vitals:   06/26/20 1130 06/26/20 1220  BP: (!) 122/91 126/89  Pulse: 67 89  Resp: 19 18  Temp:  36.7 C  SpO2: 97% 97%    Last Pain:  Vitals:   06/27/20 0937  TempSrc:   PainSc: 0-No pain                 Nolon Nations

## 2020-06-30 ENCOUNTER — Other Ambulatory Visit: Payer: Self-pay

## 2020-06-30 ENCOUNTER — Ambulatory Visit (INDEPENDENT_AMBULATORY_CARE_PROVIDER_SITE_OTHER): Payer: PPO

## 2020-06-30 DIAGNOSIS — C61 Malignant neoplasm of prostate: Secondary | ICD-10-CM | POA: Diagnosis not present

## 2020-06-30 NOTE — Progress Notes (Signed)
Fill and Pull Catheter Removal  Patient is present today for a catheter removal.  Patient was cleaned and prepped in a sterile fashion 166ml of sterile water/ saline was instilled into the bladder when the patient felt the urge to urinate. 34ml of water was then drained from the balloon.  A 14FR foley cath was removed from the bladder no complications were noted .  Patient as then given some time to void on their own.  Patient can void  55ml on their own after some time.  Patient tolerated well.  Performed by: Kolbee Bogusz,lpn   Uroflow  Peak Flow: 77ml Average Flow: 32ml Voided Volume: 67ml Voiding Time: 24sec Flow Time: 20sec Time to Peak Flow: 12sec  Input was 154ml

## 2020-07-01 ENCOUNTER — Ambulatory Visit: Payer: PPO

## 2020-07-03 ENCOUNTER — Ambulatory Visit: Payer: PPO

## 2020-07-11 ENCOUNTER — Ambulatory Visit (INDEPENDENT_AMBULATORY_CARE_PROVIDER_SITE_OTHER): Payer: PPO | Admitting: Urology

## 2020-07-11 ENCOUNTER — Encounter: Payer: Self-pay | Admitting: Urology

## 2020-07-11 ENCOUNTER — Other Ambulatory Visit: Payer: Self-pay

## 2020-07-11 VITALS — BP 119/87 | HR 73 | Temp 99.0°F | Resp 14 | Ht 71.0 in | Wt 225.0 lb

## 2020-07-11 DIAGNOSIS — C61 Malignant neoplasm of prostate: Secondary | ICD-10-CM

## 2020-07-11 DIAGNOSIS — N401 Enlarged prostate with lower urinary tract symptoms: Secondary | ICD-10-CM | POA: Insufficient documentation

## 2020-07-11 DIAGNOSIS — N138 Other obstructive and reflux uropathy: Secondary | ICD-10-CM | POA: Diagnosis not present

## 2020-07-11 DIAGNOSIS — R351 Nocturia: Secondary | ICD-10-CM

## 2020-07-11 LAB — URINALYSIS, ROUTINE W REFLEX MICROSCOPIC
Bilirubin, UA: NEGATIVE
Glucose, UA: NEGATIVE
Leukocytes,UA: NEGATIVE
Nitrite, UA: NEGATIVE
Protein,UA: NEGATIVE
Specific Gravity, UA: >1.03 — ABNORMAL HIGH (ref 1.005–1.030)
Urobilinogen, Ur: 0.2 mg/dL (ref 0.2–1.0)
pH, UA: 5.5 (ref 5.0–7.5)

## 2020-07-11 LAB — MICROSCOPIC EXAMINATION
Bacteria, UA: NONE SEEN
Epithelial Cells (non renal): NONE SEEN /hpf (ref 0–10)
Renal Epithel, UA: NONE SEEN /hpf
WBC, UA: NONE SEEN /hpf (ref 0–5)

## 2020-07-11 NOTE — Patient Instructions (Signed)

## 2020-07-11 NOTE — Progress Notes (Signed)
07/11/2020 11:54 AM   Brandon Maddox 1950-07-03 [id number]  Referring provider: [name], MD [address] [place],  [place] [address]  Chief Complaint  Patient presents with  . Routine Post Op    HPI: Brandon Maddox is a 70yo here for followup for BPH and nocturia. Since brachytherapy he has noted an increase in his nocturia to 2-3x, a weaker urianry stream, urinary frequency every 2 hours, urinary urgency but no incontinence. He is not bothered by his LUTS. No dysuria or hematuria  PMH: Past Medical History:  Diagnosis Date  . Acid reflux   . Atrial fibrillation ([place])   . Closed right scapular fracture 15 yrs ago  . Depression   . GERD (gastroesophageal reflux disease)   . Gout   . Low testosterone   . Polycythemia vera ([place]) 01/17/2017  . Prostate cancer ([place])   . Ribs, multiple fractures 1988 and 15 yrs ago  . Sleep apnea    cpap 5 to 5  . Wears glasses     Surgical History: Past Surgical History:  Procedure Laterality Date  . COLONOSCOPY WITH PROPOFOL N/A 06/01/2018   Procedure: COLONOSCOPY WITH PROPOFOL;  Surgeon: Daneil Dolin, MD;  [facility];  Service: Endoscopy;  Laterality: N/A;  10:45am  . CYSTOSCOPY N/A 06/26/2020   Procedure: CYSTOSCOPY;  Surgeon: Cleon Gustin, MD;  Location: [facility];  Service: Urology;  Laterality: N/A;  . HIP SURGERY Right 2010   Dr. Dennis Bast and then replacement of right hip  . LACERATION REPAIR  05/03/2012   Procedure: REPAIR MULTIPLE LACERATIONS;  Surgeon: Jerrell Belfast, MD;  Location: [facility];  Service: ENT;  Laterality: N/A;  . PENILE PROSTHESIS IMPLANT    . PENILE PROSTHESIS IMPLANT  2014  . POLYPECTOMY  06/01/2018   Procedure: POLYPECTOMY;  Surgeon: Daneil Dolin, MD;  [facility];  Service: Endoscopy;;  cecum,hepatic flexure  . 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 RADIOACTIVE SEED IMPLANT N/A 06/26/2020   Procedure: RADIOACTIVE SEED IMPLANT/BRACHYTHERAPY IMPLANT;   Surgeon: Cleon Gustin, MD;  Location: [facility];  Service: Urology;  Laterality: N/A;  . SPACE OAR INSTILLATION N/A 06/26/2020   Procedure: SPACE OAR INSTILLATION;  Surgeon: Cleon Gustin, MD;  Location: [facility];  Service: Urology;  Laterality: N/A;    Home Medications:  Allergies as of 07/11/2020      Reactions   Penicillins Other (See Comments)   Mother told him throat swelled up when he took at a young age  Did it involve swelling of the face/tongue/throat, SOB, or low BP? Yes Did it involve sudden or severe rash/hives, skin peeling, or any reaction on the inside of your mouth or nose? Unknown Did you need to seek medical attention at a hospital or doctor's office? Yes When did it last happen?Childhood reaction If all above answers are "NO", may proceed with cephalosporin use.      Medication List       Accurate as of July 11, 2020 11:54 AM. If you have any questions, ask your nurse or doctor.        allopurinol 300 MG tablet Commonly known as: ZYLOPRIM Take 300 mg by mouth daily.   aspirin EC 81 MG tablet Take 81 mg by mouth daily.   buPROPion 150 MG 24 hr tablet Commonly known as: WELLBUTRIN XL Take 450 mg by mouth daily.   HYDROcodone-acetaminophen 5-325 MG tablet Commonly known as:  Norco Take 1 tablet by mouth every 6 (six) hours as needed for moderate pain.   metoprolol succinate 25 MG 24 hr tablet Commonly known as: Toprol XL Take 1 tablet (25 mg total) by mouth daily.   omeprazole 20 MG tablet Commonly known as: PRILOSEC OTC Take 20 mg by mouth daily.   OVER THE COUNTER MEDICATION Vitamin b 12 1000 mg daily   OVER THE COUNTER MEDICATION Vitamin d 1000 mg daily   tamsulosin 0.4 MG Caps capsule Commonly known as: FLOMAX Take 0.4 mg by mouth daily.       Allergies:  Allergies  Allergen Reactions  . Penicillins Other (See Comments)    Mother told him throat swelled up when he took at a young age   Did it involve swelling of the face/tongue/throat, SOB, or low BP? Yes Did it involve sudden or severe rash/hives, skin peeling, or any reaction on the inside of your mouth or nose? Unknown Did you need to seek medical attention at a hospital or doctor's office? Yes When did it last happen?Childhood reaction If all above answers are "NO", may proceed with cephalosporin use.     Family History: Family History  Problem Relation Age of Onset  . Diabetes Mother   . Breast cancer Mother   . Diabetes Father   . Colon cancer Neg Hx   . Pancreatic cancer Neg Hx   . Prostate cancer Neg Hx     Social History:  reports that he quit smoking about 17 years ago. His smoking use included cigarettes. He has a 10.00 pack-year smoking history. He has never used smokeless tobacco. He reports that he does not drink alcohol and does not use drugs.  ROS: All other review of systems were reviewed and are negative except what is noted above in HPI  Physical Exam: BP 119/87   Pulse 73   Temp 99 F (37.2 C) (Oral)   Resp 14   Ht 5\' 11"  (1.803 m)   Wt 225 lb (102.1 kg)   BMI 31.38 kg/m   Constitutional:  Alert and oriented, No acute distress. HEENT: [place] AT, moist mucus membranes.  Trachea midline, no masses. Cardiovascular: No clubbing, cyanosis, or edema. Respiratory: Normal respiratory effort, no increased work of breathing. GI: Abdomen is soft, nontender, nondistended, no abdominal masses GU: No CVA tenderness.  Lymph: No cervical or inguinal lymphadenopathy. Skin: No rashes, bruises or suspicious lesions. Neurologic: Grossly intact, no focal deficits, moving all 4 extremities. Psychiatric: Normal mood and affect.  Laboratory Data: Lab Results  Component Value Date   WBC 6.3 06/23/2020   HGB 15.2 06/23/2020   HCT 48.3 06/23/2020   MCV 84.4 06/23/2020   PLT 217 06/23/2020    Lab Results  Component Value Date   CREATININE 1.08 06/23/2020    No results found for: PSA  No  results found for: TESTOSTERONE  Lab Results  Component Value Date   HGBA1C 5.9 (H) 04/02/2013    Urinalysis    Component Value Date/Time   COLORURINE YELLOW 09/10/2008 0630   APPEARANCEUR Clear 04/30/2020 1004   LABSPEC 1.025 09/10/2008 0630   PHURINE 5.5 09/10/2008 0630   GLUCOSEU Negative 04/30/2020 1004   HGBUR TRACE (A) 09/10/2008 0630   BILIRUBINUR Negative 04/30/2020 1004   KETONESUR NEGATIVE 09/10/2008 0630   PROTEINUR Trace (A) 04/30/2020 1004   PROTEINUR NEGATIVE 09/10/2008 0630   UROBILINOGEN 0.2 09/10/2008 0630   NITRITE Negative 04/30/2020 1004   NITRITE NEGATIVE 09/10/2008 0630   LEUKOCYTESUR Negative 04/30/2020  1004    Lab Results  Component Value Date   LABMICR Comment 04/30/2020    Pertinent Imaging:  No results found for this or any previous visit.  No results found for this or any previous visit.  No results found for this or any previous visit.  No results found for this or any previous visit.  No results found for this or any previous visit.  No results found for this or any previous visit.  No results found for this or any previous visit.  No results found for this or any previous visit.   Assessment & Plan:    1. Prostate cancer ([place]) -RTC 3 months with PSA  2. Nocturia FLuid management 2 hour prior to bed  3. Benign prostatic hyperplasia with urinary obstruction -Patient defers therapy at this time.   No follow-ups on file.  Nicolette Bang, MD  [facility] [place]

## 2020-07-14 NOTE — Progress Notes (Signed)
Cardiology Office Note  Date: 07/15/2020   ID: Rolland, Steinert July 23, 1950, MRN [id number]  PCP:  [name], MD  Cardiologist:  Rozann Lesches, MD Electrophysiologist:  None   Chief Complaint: 1 year cardiac follow-up  History of Present Illness: Brandon Maddox is a 70 y.o. male with a history of atrial fibrillation, DM2, polycythemia vera.  Last seen by Dr. Domenic Polite via telemedicine visit 05/09/2019.  He has been doing reasonably well.  He reported fatigue with no progressive sense of palpitations or chest pain.  CHA2DS2-VASc score was 1.  He remained on low-dose aspirin along with Toprol-XL.  Following with oncology clinic for polycythemia.  Having intermittent phlebotomies.  He was tolerating the atrial fibrillation on current therapy.  He is here for 1 year follow-up.  He states in the interim he has been diagnosed with prostate cancer and has had recent seed implants.  Continues to have occasional phlebotomies for polycythemia vera.  Otherwise he denies any recent palpitations or arrhythmias, lightheadedness, dizziness,  presyncope, or syncopal episodes.  No PND, orthopnea no CVA or TIA-like symptoms.  No bleeding issues.  Denies any claudication-like symptoms, DVT or PE-like symptoms.  Heart rate is controlled at 79   Past Medical History:  Diagnosis Date  . Acid reflux   . Atrial fibrillation ([place])   . Closed right scapular fracture 15 yrs ago  . Depression   . GERD (gastroesophageal reflux disease)   . Gout   . Low testosterone   . Polycythemia vera ([place]) 01/17/2017  . Prostate cancer ([place])   . Ribs, multiple fractures 1988 and 15 yrs ago  . Sleep apnea    cpap 5 to 5  . Wears glasses     Past Surgical History:  Procedure Laterality Date  . COLONOSCOPY WITH PROPOFOL N/A 06/01/2018   Procedure: COLONOSCOPY WITH PROPOFOL;  Surgeon: Daneil Dolin, MD;  [facility];  Service: Endoscopy;  Laterality: N/A;  10:45am  . CYSTOSCOPY N/A 06/26/2020   Procedure:  CYSTOSCOPY;  Surgeon: Cleon Gustin, MD;  [facility];  Service: Urology;  Laterality: N/A;  . HIP SURGERY Right 2010   Dr. Dennis Bast and then replacement of right hip  . LACERATION REPAIR  05/03/2012   Procedure: REPAIR MULTIPLE LACERATIONS;  Surgeon: Jerrell Belfast, MD;  Location: [facility];  Service: ENT;  Laterality: N/A;  . PENILE PROSTHESIS IMPLANT    . PENILE PROSTHESIS IMPLANT  2014  . POLYPECTOMY  06/01/2018   Procedure: POLYPECTOMY;  Surgeon: Daneil Dolin, MD;  [facility];  Service: Endoscopy;;  cecum,hepatic flexure  . Pyloric stenosis     76 weeks old  . RADIOACTIVE SEED IMPLANT N/A 06/26/2020   Procedure: RADIOACTIVE SEED IMPLANT/BRACHYTHERAPY IMPLANT;  Surgeon: Cleon Gustin, MD;  Location: [facility];  Service: Urology;  Laterality: N/A;  . SPACE OAR INSTILLATION N/A 06/26/2020   Procedure: SPACE OAR INSTILLATION;  Surgeon: Cleon Gustin, MD;  Location: [facility];  Service: Urology;  Laterality: N/A;    Current Outpatient Medications  Medication Sig Dispense Refill  . allopurinol (ZYLOPRIM) 300 MG tablet Take 300 mg by mouth daily.     Marland Kitchen aspirin EC 81 MG tablet Take 81 mg by mouth daily.    Marland Kitchen buPROPion (WELLBUTRIN XL) 150 MG 24 hr tablet Take 450 mg by mouth daily.   4  . HYDROcodone-acetaminophen (NORCO) 5-325 MG tablet Take 1 tablet by mouth every 6 (six) hours as  needed for moderate pain. 30 tablet 0  . metoprolol succinate (TOPROL XL) 25 MG 24 hr tablet Take 1 tablet (25 mg total) by mouth daily. 90 tablet 3  . omeprazole (PRILOSEC OTC) 20 MG tablet Take 20 mg by mouth daily.     Marland Kitchen OVER THE COUNTER MEDICATION Vitamin b 12 1000 mg daily    . OVER THE COUNTER MEDICATION Vitamin d 1000 mg daily    . Tamsulosin HCl (FLOMAX) 0.4 MG CAPS Take 0.4 mg by mouth daily.      No current facility-administered medications for this visit.   Allergies:  Penicillins   Social History: The patient   reports that he quit smoking about 17 years ago. His smoking use included cigarettes. He has a 10.00 pack-year smoking history. He has never used smokeless tobacco. He reports that he does not drink alcohol and does not use drugs.   Family History: The patient's family history includes Breast cancer in his mother; Diabetes in his father and mother.   ROS:  Please see the history of present illness. Otherwise, complete review of systems is positive for none.  All other systems are reviewed and negative.   Physical Exam: VS:  BP 100/78   Pulse 79   Ht 5' 10.5" (1.791 m)   Wt 226 lb 9.6 oz (102.8 kg)   SpO2 97%   BMI 32.05 kg/m , BMI Body mass index is 32.05 kg/m.  Wt Readings from Last 3 Encounters:  07/15/20 226 lb 9.6 oz (102.8 kg)  07/11/20 225 lb (102.1 kg)  06/26/20 222 lb 14.4 oz (101.1 kg)    General: Patient appears comfortable at rest. Neck: Supple, no elevated JVP or carotid bruits, no thyromegaly. Lungs: Clear to auscultation, nonlabored breathing at rest. Cardiac: Regular rate and rhythm, no S3 or significant systolic murmur, no pericardial rub. Extremities: No pitting edema, distal pulses 2+. Skin: Warm and dry. Musculoskeletal: No kyphosis. Neuropsychiatric: Alert and oriented x3, affect grossly appropriate.  ECG:  EKG May 22, 2020 atrial fibrillation with a rate of 87.  Recent Labwork: 06/23/2020: ALT 16; AST 17; BUN 20; Creatinine, Ser 1.08; Hemoglobin 15.2; Platelets 217; Potassium 4.4; Sodium 138  No results found for: CHOL, TRIG, HDL, CHOLHDL, VLDL, LDLCALC, LDLDIRECT  Other Studies Reviewed Today:   Echocardiogram 12/16/2015: Study Conclusions  - Left ventricle: The cavity size was normal. Wall thickness was increased in a pattern of mild LVH. Systolic function was normal. The estimated ejection fraction was in the range of 55% to 60%. Wall motion was normal; there were no regional wall motion abnormalities. The study is not technically  sufficient to allow evaluation of LV diastolic function.   Assessment and Plan:  1. Permanent atrial fibrillation ([place])   2. Prostate cancer ([place])   3. Polycythemia vera ([place])       1. Permanent atrial fibrillation [facility]) Denies any issues associated with his atrial fibrillation.  Heart rate is controlled today at 79.  Most recent EKG on 05/22/2020 demonstrated atrial fibrillation with a heart rate of 87.  He denies any bleeding issues on anticoagulation.  Continue aspirin 81 mg daily.  Continue Toprol-XL 25 mg daily.  2. Prostate cancer ([place]) States he recently had seed implants for recent diagnosis of prostate CA.  Follows with Dr. Alyson Ingles urology  3.  Polycythemia vera Sees Dr. Delton Coombes for occasional phlebotomies.  Recent hemoglobin 15.2 and hematocrit 48.3.      Medication Adjustments/Labs and Tests Ordered: Current medicines are reviewed at length with the  patient today.  Concerns regarding medicines are outlined above.   Disposition: Follow-up with Dr. Domenic Polite or APP 1 year  Signed, Levell July, NP 07/15/2020 8:50 AM    [facility] at [place], [place], [place] [address] Phone: [phone]; Fax: [phone]

## 2020-07-15 ENCOUNTER — Ambulatory Visit: Payer: PPO | Admitting: Family Medicine

## 2020-07-15 ENCOUNTER — Encounter: Payer: Self-pay | Admitting: Family Medicine

## 2020-07-15 VITALS — BP 100/78 | HR 79 | Ht 70.5 in | Wt 226.6 lb

## 2020-07-15 DIAGNOSIS — C61 Malignant neoplasm of prostate: Secondary | ICD-10-CM

## 2020-07-15 DIAGNOSIS — I4821 Permanent atrial fibrillation: Secondary | ICD-10-CM | POA: Diagnosis not present

## 2020-07-15 DIAGNOSIS — D45 Polycythemia vera: Secondary | ICD-10-CM

## 2020-07-15 NOTE — Patient Instructions (Addendum)

## 2020-07-21 ENCOUNTER — Telehealth: Payer: Self-pay

## 2020-07-21 ENCOUNTER — Encounter: Payer: Self-pay | Admitting: Urology

## 2020-07-21 ENCOUNTER — Other Ambulatory Visit: Payer: Self-pay

## 2020-07-21 NOTE — Telephone Encounter (Signed)
Spoke with patient in regards to post seed appointment with Freeman Caldron PA on 07/23/20 @ 9:30am. Patient verbalized understanding of appointment date and time. Called to review meaningful use questions prior to post seed appointment. TM

## 2020-07-21 NOTE — Telephone Encounter (Signed)
Left patient a voicemail message in regards to post seed appointment with Freeman Caldron PA on 07/23/20 @ 9:30am. Called to review meaningful use questions prior to visit. TM

## 2020-07-22 ENCOUNTER — Telehealth: Payer: Self-pay | Admitting: *Deleted

## 2020-07-22 NOTE — Telephone Encounter (Signed)
CALLED PATIENT TO REMIND OF POST SEED APPTS. FOR 07-23-20, SPOKE WITH PATIENT AND HE IS AWARE OF THESE APPTS.

## 2020-07-23 ENCOUNTER — Ambulatory Visit
Admission: RE | Admit: 2020-07-23 | Discharge: 2020-07-23 | Disposition: A | Discharge status: Home / Self Care | Payer: PPO | Source: Ambulatory Visit | Attending: Urology | Admitting: Urology

## 2020-07-23 ENCOUNTER — Ambulatory Visit
Admission: RE | Admit: 2020-07-23 | Discharge: 2020-07-23 | Disposition: A | Discharge status: Home / Self Care | Payer: PPO | Source: Ambulatory Visit | Attending: Radiation Oncology | Admitting: Radiation Oncology

## 2020-07-23 VITALS — BP 131/84 | HR 89 | Temp 98.1°F | Resp 20 | Ht 70.5 in | Wt 223.4 lb

## 2020-07-23 DIAGNOSIS — Z7982 Long term (current) use of aspirin: Secondary | ICD-10-CM | POA: Diagnosis not present

## 2020-07-23 DIAGNOSIS — Z79899 Other long term (current) drug therapy: Secondary | ICD-10-CM | POA: Insufficient documentation

## 2020-07-23 DIAGNOSIS — Z510 Encounter for antineoplastic radiation therapy: Secondary | ICD-10-CM | POA: Insufficient documentation

## 2020-07-23 DIAGNOSIS — Z923 Personal history of irradiation: Secondary | ICD-10-CM | POA: Diagnosis not present

## 2020-07-23 DIAGNOSIS — C61 Malignant neoplasm of prostate: Secondary | ICD-10-CM | POA: Diagnosis not present

## 2020-07-23 DIAGNOSIS — R351 Nocturia: Secondary | ICD-10-CM | POA: Diagnosis not present

## 2020-07-23 NOTE — Progress Notes (Signed)
Patient here today for a post seed appointment. Patient denies having pain. Patient denies dysuria or hematuria. Patient states that he has an appointment with urologist Dr. Alyson Ingles in 2 months.

## 2020-07-23 NOTE — Progress Notes (Signed)
Radiation Oncology         [phone] ________________________________  Name: Brandon Maddox MRN: [id number]  Date: 07/23/2020  DOB: [birth year]  Post-Seed Follow-Up Visit Note  CC: [name], MD  Cleon Gustin, MD  Diagnosis:   70 y.o. gentleman with Stage T1c adenocarcinoma of the prostate with Gleason score of 3+3, and PSA of 5.1.    ICD-10-CM   1. Prostate cancer ([place])  C61     Interval Since Last Radiation:  4 weeks 06/26/20:  Insertion of radioactive I-125 seeds into the prostate gland; 145 Gy, definitive therapy with placement of SpaceOAR gel.  Narrative:  The patient returns today for routine follow-up.  He is complaining of increased urinary frequency and urinary hesitation symptoms. He filled out a questionnaire regarding urinary function today providing and overall IPSS score of 13 characterizing his symptoms as moderate.  He continues with nocturia x3, mild increased frequency and a slightly weaker flow of stream with occasional hesitancy and intermittency but reports that his LUTS continue to steadily improve.  He specifically denies dysuria, gross hematuria, incomplete bladder emptying or incontinence.  His pre-implant score was 2. He denies any abdominal pain or bowel symptoms.  He reports a healthy appetite and is maintaining his weight.  He denies any significant decrease in energy level and is overall, quite pleased with his progress to date.  ALLERGIES:  is allergic to penicillins.  Meds: Current Outpatient Medications  Medication Sig Dispense Refill  . allopurinol (ZYLOPRIM) 300 MG tablet Take 300 mg by mouth daily.     Marland Kitchen aspirin EC 81 MG tablet Take 81 mg by mouth daily.    Marland Kitchen buPROPion (WELLBUTRIN XL) 150 MG 24 hr tablet Take 450 mg by mouth daily.   4  . HYDROcodone-acetaminophen (NORCO) 5-325 MG tablet Take 1 tablet by mouth every 6 (six) hours as needed for moderate pain. 30 tablet 0  . metoprolol succinate (TOPROL XL) 25 MG 24 hr tablet Take 1 tablet (25  mg total) by mouth daily. 90 tablet 3  . omeprazole (PRILOSEC OTC) 20 MG tablet Take 20 mg by mouth daily.     Marland Kitchen OVER THE COUNTER MEDICATION Vitamin b 12 1000 mg daily    . OVER THE COUNTER MEDICATION Vitamin d 1000 mg daily    . Tamsulosin HCl (FLOMAX) 0.4 MG CAPS Take 0.4 mg by mouth daily.      No current facility-administered medications for this encounter.    Physical Findings: In general this is a well appearing Caucasian male in no acute distress. He's alert and oriented x4 and appropriate throughout the examination. Cardiopulmonary assessment is negative for acute distress and he exhibits normal effort.   Lab Findings: Lab Results  Component Value Date   WBC 6.3 06/23/2020   HGB 15.2 06/23/2020   HCT 48.3 06/23/2020   MCV 84.4 06/23/2020   PLT 217 06/23/2020    Radiographic Findings:  Patient underwent CT imaging in our clinic for post implant dosimetry. The CT will be reviewed by Dr. Tammi Klippel to confirm there is an adequate distribution of radioactive seeds throughout the prostate gland and ensure that there are no seeds in or near the rectum.  We suspect the final radiation plan and dosimetry will show appropriate coverage of the prostate gland. He understands that we will call and inform him of any unexpected findings on further review of his imaging and dosimetry.  Impression/Plan:  70 y.o. gentleman with Stage T1c adenocarcinoma of the prostate with Gleason  score of 3+3, and PSA of 5.1. The patient is recovering from the effects of radiation. His urinary symptoms should gradually improve over the next 4-6 months. We talked about this today. He is encouraged by his improvement already and is otherwise pleased with his outcome. We also talked about long-term follow-up for prostate cancer following seed implant. He understands that ongoing PSA determinations and digital rectal exams will help perform surveillance to rule out disease recurrence. He has a follow-up visit with Dr.  Alyson Ingles on 07/11/2020 and has his next follow up appointment scheduled for labs on 10/14/2020 and will see Dr. Alyson Ingles the following week. He understands what to expect with his PSA measures. Patient was also educated today about some of the long-term effects from radiation including a small risk for rectal bleeding and possibly erectile dysfunction. We talked about some of the general management approaches to these potential complications. However, I did encourage the patient to contact our office or return at any point if he has questions or concerns related to his previous radiation and prostate cancer.    Nicholos Johns, PA-C

## 2020-07-23 NOTE — Progress Notes (Signed)
  Radiation Oncology         [phone] ________________________________  Name: Brandon Maddox MRN: [id number]  Date: 07/23/2020  DOB: Mar 31, 1951  COMPLEX SIMULATION NOTE  NARRATIVE:  The patient was brought to the [facility] today following prostate seed implantation approximately one month ago.  Identity was confirmed.  All relevant records and images related to the planned course of therapy were reviewed.  Then, the patient was set-up supine.  CT images were obtained.  The CT images were loaded into the planning software.  Then the prostate and rectum were contoured.  Treatment planning then occurred.  The implanted iodine 125 seeds were identified by the physics staff for projection of radiation distribution  I have requested : 3D Simulation  I have requested a DVH of the following structures: Prostate and rectum.    ________________________________  Sheral Apley Tammi Klippel, M.D.

## 2020-07-30 ENCOUNTER — Encounter: Payer: Self-pay | Admitting: Radiation Oncology

## 2020-07-30 DIAGNOSIS — Z510 Encounter for antineoplastic radiation therapy: Secondary | ICD-10-CM | POA: Diagnosis not present

## 2020-07-30 DIAGNOSIS — C61 Malignant neoplasm of prostate: Secondary | ICD-10-CM | POA: Diagnosis not present

## 2020-08-05 ENCOUNTER — Inpatient Hospital Stay (HOSPITAL_COMMUNITY): Payer: PPO | Attending: Hematology

## 2020-08-05 ENCOUNTER — Other Ambulatory Visit: Payer: Self-pay

## 2020-08-05 DIAGNOSIS — D45 Polycythemia vera: Secondary | ICD-10-CM | POA: Diagnosis not present

## 2020-08-05 DIAGNOSIS — Z87891 Personal history of nicotine dependence: Secondary | ICD-10-CM | POA: Diagnosis not present

## 2020-08-05 DIAGNOSIS — Z7982 Long term (current) use of aspirin: Secondary | ICD-10-CM | POA: Diagnosis not present

## 2020-08-05 DIAGNOSIS — E538 Deficiency of other specified B group vitamins: Secondary | ICD-10-CM | POA: Insufficient documentation

## 2020-08-05 DIAGNOSIS — I4891 Unspecified atrial fibrillation: Secondary | ICD-10-CM | POA: Insufficient documentation

## 2020-08-05 DIAGNOSIS — K219 Gastro-esophageal reflux disease without esophagitis: Secondary | ICD-10-CM | POA: Insufficient documentation

## 2020-08-05 DIAGNOSIS — Z8546 Personal history of malignant neoplasm of prostate: Secondary | ICD-10-CM | POA: Insufficient documentation

## 2020-08-05 DIAGNOSIS — G4730 Sleep apnea, unspecified: Secondary | ICD-10-CM | POA: Insufficient documentation

## 2020-08-05 DIAGNOSIS — Z79899 Other long term (current) drug therapy: Secondary | ICD-10-CM | POA: Diagnosis not present

## 2020-08-05 DIAGNOSIS — E559 Vitamin D deficiency, unspecified: Secondary | ICD-10-CM | POA: Insufficient documentation

## 2020-08-05 DIAGNOSIS — Z803 Family history of malignant neoplasm of breast: Secondary | ICD-10-CM | POA: Diagnosis not present

## 2020-08-05 LAB — COMPREHENSIVE METABOLIC PANEL
ALT: 16 U/L (ref 0–44)
AST: 16 U/L (ref 15–41)
Albumin: 3.9 g/dL (ref 3.5–5.0)
Alkaline Phosphatase: 78 U/L (ref 38–126)
Anion gap: 9 (ref 5–15)
BUN: 22 mg/dL (ref 8–23)
CO2: 25 mmol/L (ref 22–32)
Calcium: 9 mg/dL (ref 8.9–10.3)
Chloride: 102 mmol/L (ref 98–111)
Creatinine, Ser: 1.4 mg/dL — ABNORMAL HIGH (ref 0.61–1.24)
GFR, Estimated: 55 mL/min — ABNORMAL LOW (ref >60)
Glucose, Bld: 113 mg/dL — ABNORMAL HIGH (ref 70–99)
Potassium: 4.1 mmol/L (ref 3.5–5.1)
Sodium: 136 mmol/L (ref 135–145)
Total Bilirubin: 0.4 mg/dL (ref 0.3–1.2)
Total Protein: 7.1 g/dL (ref 6.5–8.1)

## 2020-08-05 LAB — CBC WITH DIFFERENTIAL/PLATELET
Abs Immature Granulocytes: 0.05 K/uL (ref 0.00–0.07)
Basophils Absolute: 0 K/uL (ref 0.0–0.1)
Basophils Relative: 0 %
Eosinophils Absolute: 0.1 K/uL (ref 0.0–0.5)
Eosinophils Relative: 1 %
HCT: 50 % (ref 39.0–52.0)
Hemoglobin: 15.8 g/dL (ref 13.0–17.0)
Immature Granulocytes: 1 %
Lymphocytes Relative: 24 %
Lymphs Abs: 1.7 K/uL (ref 0.7–4.0)
MCH: 27.2 pg (ref 26.0–34.0)
MCHC: 31.6 g/dL (ref 30.0–36.0)
MCV: 86.1 fL (ref 80.0–100.0)
Monocytes Absolute: 0.4 K/uL (ref 0.1–1.0)
Monocytes Relative: 6 %
Neutro Abs: 5 K/uL (ref 1.7–7.7)
Neutrophils Relative %: 68 %
Platelets: 235 K/uL (ref 150–400)
RBC: 5.81 MIL/uL (ref 4.22–5.81)
RDW: 17 % — ABNORMAL HIGH (ref 11.5–15.5)
WBC: 7.3 K/uL (ref 4.0–10.5)
nRBC: 0 % (ref 0.0–0.2)

## 2020-08-05 LAB — LACTATE DEHYDROGENASE: LDH: 119 U/L (ref 98–192)

## 2020-08-05 LAB — VITAMIN D 25 HYDROXY (VIT D DEFICIENCY, FRACTURES): Vit D, 25-Hydroxy: 42.83 ng/mL (ref 30–100)

## 2020-08-05 LAB — VITAMIN B12: Vitamin B-12: 1069 pg/mL — ABNORMAL HIGH (ref 180–914)

## 2020-08-12 ENCOUNTER — Other Ambulatory Visit: Payer: Self-pay

## 2020-08-12 ENCOUNTER — Inpatient Hospital Stay (HOSPITAL_COMMUNITY): Payer: PPO

## 2020-08-12 ENCOUNTER — Inpatient Hospital Stay (HOSPITAL_COMMUNITY): Payer: PPO | Admitting: Hematology

## 2020-08-12 VITALS — BP 112/67 | HR 89 | Temp 97.5°F | Resp 21 | Wt 227.6 lb

## 2020-08-12 DIAGNOSIS — D45 Polycythemia vera: Secondary | ICD-10-CM | POA: Diagnosis not present

## 2020-08-12 NOTE — Progress Notes (Signed)
Brandon Maddox presents today for phlebotomy per MD orders. Phlebotomy procedure started at 15:28 pm and ended at 15:33 pm. HGB 15.8. HCT 50.  500 cc removed. Patient tolerated procedure well. IV needle removed intact. Phlebotomy given today per MD orders. Tolerated infusion without adverse affects. Vital signs stable. No complaints at this time. Discharged from clinic ambulatory in stable condition. Alert and oriented x 3. F/U with [facility] as scheduled.

## 2020-08-12 NOTE — Patient Instructions (Signed)
[place] at [facility] Discharge Instructions  You were seen today by Dr. Delton Coombes. He went over your recent results. You will be scheduled to have another phlebotomy. Take vitamin B12 every other day and vitamin D daily. Dr. Delton Coombes will see you back in 6 months for labs and follow up.   Thank you for choosing [place] at [facility] to provide your oncology and hematology care.  To afford each patient quality time with our provider, please arrive at least 15 minutes before your scheduled appointment time.   If you have a lab appointment with the [facility] please come in thru the Main Entrance and check in at the main information desk  You need to re-schedule your appointment should you arrive 10 or more minutes late.  We strive to give you quality time with our providers, and arriving late affects you and other patients whose appointments are after yours.  Also, if you no show three or more times for appointments you may be dismissed from the clinic at the providers discretion.     Again, thank you for choosing [facility].  Our hope is that these requests will decrease the amount of time that you wait before being seen by our physicians.       _____________________________________________________________  Should you have questions after your visit to [facility], please contact our office at [phone] between the hours of 8:00 a.m. and 4:30 p.m.  Voicemails left after 4:00 p.m. will not be returned until the following business day.  For prescription refill requests, have your pharmacy contact our office and allow 72 hours.    Cancer Center Support Programs:   > Cancer Support Group  2nd Tuesday of the month 1pm-2pm, Journey Room

## 2020-08-12 NOTE — Progress Notes (Signed)
[place], [place] [address]   CLINIC:  Medical Oncology/Hematology  PCP:  [name], MD [address] / [place] [address]  [phone]  REASON FOR VISIT:  Follow-up for polycythemia vera  PRIOR THERAPY: None  CURRENT THERAPY: Intermittent phlebotomies last on 02/12/2020  INTERVAL HISTORY:  Brandon Maddox, a 70 y.o. male, returns for routine follow-up for his polycythemia vera. Brandon Maddox was last seen on 02/06/2020.  Today he reports feeling okay. He tolerated the cystoscopy and prostate seed implants on 03/10 with Dr. Clyde Canterbury tolerated it well; he reports having difficulty urinating but denies having diarrhea or constipation. He reports that his energy levels are starting to decrease again since having his last phlebotomy on 02/12/2020, but denies having headaches, vision changes, aquagenic pruritis or Raynaud phenomenon. He is taking vitamin B12 every other day and vitamin D daily.   REVIEW OF SYSTEMS:  Review of Systems  Constitutional: Positive for fatigue (75%). Negative for appetite change.  Eyes: Negative for eye problems.  Cardiovascular: Positive for chest pain (d/t a-fib).  Genitourinary: Positive for difficulty urinating.   Skin: Negative for itching and rash.  Neurological: Negative for headaches.  All other systems reviewed and are negative.   PAST MEDICAL/SURGICAL HISTORY:  Past Medical History:  Diagnosis Date  . Acid reflux   . Atrial fibrillation ([facility])   . Closed right scapular fracture 15 yrs ago  . Depression   . GERD (gastroesophageal reflux disease)   . Gout   . Low testosterone   . Polycythemia vera ([facility]) 01/17/2017  . Prostate cancer ([facility])   . Ribs, multiple fractures 1988 and 15 yrs ago  . Sleep apnea    cpap 5 to 5  . Wears glasses    Past Surgical History:  Procedure Laterality Date  . COLONOSCOPY WITH PROPOFOL N/A 06/01/2018   Procedure: COLONOSCOPY WITH PROPOFOL;  Surgeon: Daneil Dolin, MD;  [facility];  Service: Endoscopy;  Laterality: N/A;  10:45am  . CYSTOSCOPY N/A 06/26/2020   Procedure: CYSTOSCOPY;  Surgeon: Cleon Gustin, MD;  Location: [facility];  Service: Urology;  Laterality: N/A;  . HIP SURGERY Right 2010   Dr. Dennis Bast and then replacement of right hip  . LACERATION REPAIR  05/03/2012   Procedure: REPAIR MULTIPLE LACERATIONS;  Surgeon: Jerrell Belfast, MD;  Location: [facility];  Service: ENT;  Laterality: N/A;  . PENILE PROSTHESIS IMPLANT    . PENILE PROSTHESIS IMPLANT  2014  . POLYPECTOMY  06/01/2018   Procedure: POLYPECTOMY;  Surgeon: Daneil Dolin, MD;  [facility];  Service: Endoscopy;;  cecum,hepatic flexure  . 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 RADIOACTIVE SEED IMPLANT N/A 06/26/2020   Procedure: RADIOACTIVE SEED IMPLANT/BRACHYTHERAPY IMPLANT;  Surgeon: Cleon Gustin, MD;  Location: [facility];  Service: Urology;  Laterality: N/A;  . SPACE OAR INSTILLATION N/A 06/26/2020   Procedure: SPACE OAR INSTILLATION;  Surgeon: Cleon Gustin, MD;  Location: [facility];  Service: Urology;  Laterality: N/A;    SOCIAL HISTORY:  Social History   Socioeconomic History  . Marital status: Divorced    Spouse name: Not on file  . Number of children: 1  . Years of education: Not on file  . Highest education level: Not on file  Occupational History    Employer: SELF EMPLOYED    Comment: retired  Tobacco Use  . Smoking status: Former  Smoker    Packs/day: 1.00    Years: 10.00    Pack years: 10.00    Types: Cigarettes    Quit date: 04/20/2003    Years since quitting: 17.3  . Smokeless tobacco: Never Used  Vaping Use  . Vaping Use: Never used  Substance and Sexual Activity  . Alcohol use: No    Alcohol/week: 0.0 standard drinks    Comment: History of alcohol abuse in the past; none in 7 years  . Drug use: No  . Sexual activity: Yes  Other Topics Concern  . Not on  file  Social History Narrative  . Not on file   Social Determinants of Health   Financial Resource Strain: Not on file  Food Insecurity: Not on file  Transportation Needs: Not on file  Physical Activity: Not on file  Stress: Not on file  Social Connections: Not on file  Intimate Partner Violence: Not on file    FAMILY HISTORY:  Family History  Problem Relation Age of Onset  . Diabetes Mother   . Breast cancer Mother   . Diabetes Father   . Colon cancer Neg Hx   . Pancreatic cancer Neg Hx   . Prostate cancer Neg Hx     CURRENT MEDICATIONS:  Current Outpatient Medications  Medication Sig Dispense Refill  . allopurinol (ZYLOPRIM) 300 MG tablet Take 300 mg by mouth daily.     Marland Kitchen aspirin EC 81 MG tablet Take 81 mg by mouth daily.    Marland Kitchen buPROPion (WELLBUTRIN XL) 150 MG 24 hr tablet Take 450 mg by mouth daily.   4  . HYDROcodone-acetaminophen (NORCO) 5-325 MG tablet Take 1 tablet by mouth every 6 (six) hours as needed for moderate pain. 30 tablet 0  . metoprolol succinate (TOPROL XL) 25 MG 24 hr tablet Take 1 tablet (25 mg total) by mouth daily. 90 tablet 3  . omeprazole (PRILOSEC OTC) 20 MG tablet Take 20 mg by mouth daily.     Marland Kitchen OVER THE COUNTER MEDICATION Vitamin b 12 1000 mg daily    . OVER THE COUNTER MEDICATION Vitamin d 1000 mg daily    . Tamsulosin HCl (FLOMAX) 0.4 MG CAPS Take 0.4 mg by mouth daily.      No current facility-administered medications for this visit.    ALLERGIES:  Allergies  Allergen Reactions  . Penicillins Other (See Comments)    Mother told him throat swelled up when he took at a young age  Did it involve swelling of the face/tongue/throat, SOB, or low BP? Yes Did it involve sudden or severe rash/hives, skin peeling, or any reaction on the inside of your mouth or nose? Unknown Did you need to seek medical attention at a hospital or doctor's office? Yes When did it last happen?Childhood reaction If all above answers are "NO", may proceed with  cephalosporin use.     PHYSICAL EXAM:  Performance status (ECOG): 1 - Symptomatic but completely ambulatory  Vitals:   08/12/20 1426  BP: 112/67  Pulse: 89  Resp: (!) 21  Temp: (!) 97.5 F (36.4 C)  SpO2: 99%   Wt Readings from Last 3 Encounters:  08/12/20 227 lb 9.6 oz (103.2 kg)  07/23/20 223 lb 6.4 oz (101.3 kg)  07/15/20 226 lb 9.6 oz (102.8 kg)   Physical Exam Vitals reviewed.  Constitutional:      Appearance: Normal appearance. He is obese.  Cardiovascular:     Rate and Rhythm: Normal rate and regular rhythm.  Pulses: Normal pulses.     Heart sounds: Normal heart sounds.  Pulmonary:     Effort: Pulmonary effort is normal.     Breath sounds: Normal breath sounds.  Abdominal:     Palpations: Abdomen is soft. There is no hepatomegaly, splenomegaly or mass.     Tenderness: There is no abdominal tenderness.     Hernia: No hernia is present.  Musculoskeletal:     Right lower leg: No edema.     Left lower leg: No edema.  Neurological:     General: No focal deficit present.     Mental Status: He is alert and oriented to person, place, and time.  Psychiatric:        Mood and Affect: Mood normal.        Behavior: Behavior normal.     LABORATORY DATA:  I have reviewed the labs as listed.  CBC Latest Ref Rng & Units 08/05/2020 06/23/2020 01/29/2020  WBC 4.0 - 10.5 K/uL 7.3 6.3 6.9  Hemoglobin 13.0 - 17.0 g/dL 15.8 15.2 16.8  Hematocrit 39.0 - 52.0 % 50.0 48.3 52.4(H)  Platelets 150 - 400 K/uL 235 217 225   CMP Latest Ref Rng & Units 08/05/2020 06/23/2020 01/29/2020  Glucose 70 - 99 mg/dL 113(H) 87 93  BUN 8 - 23 mg/dL 22 20 19   Creatinine 0.61 - 1.24 mg/dL 1.40(H) 1.08 1.14  Sodium 135 - 145 mmol/L 136 138 134(L)  Potassium 3.5 - 5.1 mmol/L 4.1 4.4 5.2(H)  Chloride 98 - 111 mmol/L 102 108 99  CO2 22 - 32 mmol/L 25 23 27   Calcium 8.9 - 10.3 mg/dL 9.0 9.3 9.5  Total Protein 6.5 - 8.1 g/dL 7.1 6.9 6.9  Total Bilirubin 0.3 - 1.2 mg/dL 0.4 0.9 0.9  Alkaline Phos  38 - 126 U/L 78 62 72  AST 15 - 41 U/L 16 17 16   ALT 0 - 44 U/L 16 16 18       Component Value Date/Time   RBC 5.81 08/05/2020 1322   MCV 86.1 08/05/2020 1322   MCH 27.2 08/05/2020 1322   MCHC 31.6 08/05/2020 1322   RDW 17.0 (H) 08/05/2020 1322   LYMPHSABS 1.7 08/05/2020 1322   MONOABS 0.4 08/05/2020 1322   EOSABS 0.1 08/05/2020 1322   BASOSABS 0.0 08/05/2020 1322   Lab Results  Component Value Date   LDH 119 08/05/2020   LDH 124 01/29/2020   LDH 113 09/25/2019   Lab Results  Component Value Date   VD25OH 42.83 08/05/2020   VD25OH 26.14 (L) 01/29/2020    DIAGNOSTIC IMAGING:  I have independently reviewed the scans and discussed with the patient. No results found.   ASSESSMENT:  1. Secondary polycythemia: -Jak 2 V6 30F and reflex testing was negative. -Initially thought to be secondary to testosterone supplements. He has been off of testosterone since July 2018 however he continues to have secondary polycythemia. -Last phlebotomy was in December 2019. -He does not have any vasomotor symptoms. No aquagenic pruritus. No erythromelalgia's. -BCR/ABL by PCR showed trace positivity for B2 A2 transcript. Will consider doing FISH testing at some point. -Bone marrow biopsy November 2018 showed slightly hypercellular bone marrow for age with trilineage hematopoiesis. No definite morphological evidence of myeloproliferative disorder. -Last phlebotomy was in February 2021.  2.  T1c prostate cancer: - Gleason 3+3, PSA 5.1. - Status post seed implants on 06/26/2020.   PLAN:  1. Secondary polycythemia: -He felt with improvement in energy levels after last phlebotomy. As she is complaining of  feeling tired again. - Reviewed his labs from 08/05/2020 which showed hemoglobin 15.8 and hematocrit 50.  White count and platelet count was normal. - Recommend phlebotomy x1.  Recommend follow-up in 6 months with labs.  2.  Vitamin D deficiency: -Continue vitamin D 1000 units  daily.  Vitamin D level was 42.83.  3.  Vitamin B12 deficiency: - He is taking B12 1 mg tablet daily.  B12 level was 1000. - Recommend take B12 1 mg tablet every other day.  Orders placed this encounter:  No orders of the defined types were placed in this encounter.    Derek Jack, MD [place] [phone]   I, Milinda Antis, am acting as a scribe for Dr. Sanda Linger.  I, Derek Jack MD, have reviewed the above documentation for accuracy and completeness, and I agree with the above.

## 2020-08-25 NOTE — Progress Notes (Signed)
  Radiation Oncology         [phone]) [phone] ________________________________  Name: Brandon Maddox MRN: [id number]  Date: 07/30/2020  DOB: [birth year]  3D Planning Note   Prostate Brachytherapy Post-Implant Dosimetry  Diagnosis:  70 y.o. gentleman with Stage T1c adenocarcinoma of the prostate with Gleason score of 3+3, and PSA of 5.1.  Narrative: On a previous date, Brandon Maddox returned following prostate seed implantation for post implant planning. He underwent CT scan complex simulation to delineate the three-dimensional structures of the pelvis and demonstrate the radiation distribution.  Since that time, the seed localization, and complex isodose planning with dose volume histograms have now been completed.  Results:   Prostate Coverage - The dose of radiation delivered to the 90% or more of the prostate gland (D90) was 94.45% of the prescription dose. This exceeds our goal of greater than 90%. Rectal Sparing - The volume of rectal tissue receiving the prescription dose or higher was 0.0 cc. This falls under our thresholds tolerance of 1.0 cc.  Impression: The prostate seed implant appears to show adequate target coverage and appropriate rectal sparing.  Plan:  The patient will continue to follow with urology for ongoing PSA determinations. I would anticipate a high likelihood for local tumor control with minimal risk for rectal morbidity.  ________________________________  Sheral Apley Tammi Klippel, M.D.

## 2020-10-14 ENCOUNTER — Other Ambulatory Visit: Payer: PPO

## 2020-10-14 ENCOUNTER — Other Ambulatory Visit: Payer: Self-pay

## 2020-10-14 DIAGNOSIS — C61 Malignant neoplasm of prostate: Secondary | ICD-10-CM

## 2020-10-15 LAB — PSA: Prostate Specific Ag, Serum: 2.8 ng/mL (ref 0.0–4.0)

## 2020-10-28 NOTE — Progress Notes (Signed)
Results sent via my chart 

## 2020-11-20 DIAGNOSIS — H2512 Age-related nuclear cataract, left eye: Secondary | ICD-10-CM | POA: Diagnosis not present

## 2020-11-20 DIAGNOSIS — Z01818 Encounter for other preprocedural examination: Secondary | ICD-10-CM | POA: Diagnosis not present

## 2020-12-01 DIAGNOSIS — I4821 Permanent atrial fibrillation: Secondary | ICD-10-CM | POA: Diagnosis not present

## 2020-12-01 DIAGNOSIS — Z79899 Other long term (current) drug therapy: Secondary | ICD-10-CM | POA: Diagnosis not present

## 2020-12-01 DIAGNOSIS — C61 Malignant neoplasm of prostate: Secondary | ICD-10-CM | POA: Diagnosis not present

## 2020-12-01 DIAGNOSIS — F329 Major depressive disorder, single episode, unspecified: Secondary | ICD-10-CM | POA: Diagnosis not present

## 2020-12-01 DIAGNOSIS — M109 Gout, unspecified: Secondary | ICD-10-CM | POA: Diagnosis not present

## 2020-12-05 DIAGNOSIS — H2512 Age-related nuclear cataract, left eye: Secondary | ICD-10-CM | POA: Diagnosis not present

## 2020-12-08 DIAGNOSIS — E785 Hyperlipidemia, unspecified: Secondary | ICD-10-CM | POA: Diagnosis not present

## 2020-12-08 DIAGNOSIS — M109 Gout, unspecified: Secondary | ICD-10-CM | POA: Diagnosis not present

## 2020-12-08 DIAGNOSIS — I4811 Longstanding persistent atrial fibrillation: Secondary | ICD-10-CM | POA: Diagnosis not present

## 2020-12-08 DIAGNOSIS — Z8546 Personal history of malignant neoplasm of prostate: Secondary | ICD-10-CM | POA: Diagnosis not present

## 2020-12-08 DIAGNOSIS — R7309 Other abnormal glucose: Secondary | ICD-10-CM | POA: Diagnosis not present

## 2021-02-05 ENCOUNTER — Inpatient Hospital Stay (HOSPITAL_COMMUNITY): Payer: PPO | Attending: Hematology

## 2021-02-05 ENCOUNTER — Other Ambulatory Visit: Payer: Self-pay

## 2021-02-05 DIAGNOSIS — Z803 Family history of malignant neoplasm of breast: Secondary | ICD-10-CM | POA: Insufficient documentation

## 2021-02-05 DIAGNOSIS — C61 Malignant neoplasm of prostate: Secondary | ICD-10-CM | POA: Insufficient documentation

## 2021-02-05 DIAGNOSIS — I4891 Unspecified atrial fibrillation: Secondary | ICD-10-CM | POA: Insufficient documentation

## 2021-02-05 DIAGNOSIS — R5383 Other fatigue: Secondary | ICD-10-CM | POA: Diagnosis not present

## 2021-02-05 DIAGNOSIS — M109 Gout, unspecified: Secondary | ICD-10-CM | POA: Insufficient documentation

## 2021-02-05 DIAGNOSIS — Z87891 Personal history of nicotine dependence: Secondary | ICD-10-CM | POA: Insufficient documentation

## 2021-02-05 DIAGNOSIS — D45 Polycythemia vera: Secondary | ICD-10-CM

## 2021-02-05 DIAGNOSIS — D751 Secondary polycythemia: Secondary | ICD-10-CM | POA: Diagnosis not present

## 2021-02-05 DIAGNOSIS — E538 Deficiency of other specified B group vitamins: Secondary | ICD-10-CM | POA: Insufficient documentation

## 2021-02-05 DIAGNOSIS — Z7982 Long term (current) use of aspirin: Secondary | ICD-10-CM | POA: Diagnosis not present

## 2021-02-05 DIAGNOSIS — G4730 Sleep apnea, unspecified: Secondary | ICD-10-CM | POA: Insufficient documentation

## 2021-02-05 DIAGNOSIS — Z79899 Other long term (current) drug therapy: Secondary | ICD-10-CM | POA: Insufficient documentation

## 2021-02-05 DIAGNOSIS — E559 Vitamin D deficiency, unspecified: Secondary | ICD-10-CM | POA: Diagnosis not present

## 2021-02-05 DIAGNOSIS — K219 Gastro-esophageal reflux disease without esophagitis: Secondary | ICD-10-CM | POA: Diagnosis not present

## 2021-02-05 LAB — CBC WITH DIFFERENTIAL/PLATELET
Abs Immature Granulocytes: 0.03 10*3/uL (ref 0.00–0.07)
Basophils Absolute: 0.1 10*3/uL (ref 0.0–0.1)
Basophils Relative: 1 %
Eosinophils Absolute: 0.1 10*3/uL (ref 0.0–0.5)
Eosinophils Relative: 1 %
HCT: 47.8 % (ref 39.0–52.0)
Hemoglobin: 14.8 g/dL (ref 13.0–17.0)
Immature Granulocytes: 1 %
Lymphocytes Relative: 25 %
Lymphs Abs: 1.4 10*3/uL (ref 0.7–4.0)
MCH: 26 pg (ref 26.0–34.0)
MCHC: 31 g/dL (ref 30.0–36.0)
MCV: 83.9 fL (ref 80.0–100.0)
Monocytes Absolute: 0.4 10*3/uL (ref 0.1–1.0)
Monocytes Relative: 7 %
Neutro Abs: 3.6 10*3/uL (ref 1.7–7.7)
Neutrophils Relative %: 65 %
Platelets: 229 10*3/uL (ref 150–400)
RBC: 5.7 MIL/uL (ref 4.22–5.81)
RDW: 16.7 % — ABNORMAL HIGH (ref 11.5–15.5)
WBC: 5.5 10*3/uL (ref 4.0–10.5)
nRBC: 0 % (ref 0.0–0.2)

## 2021-02-05 LAB — VITAMIN D 25 HYDROXY (VIT D DEFICIENCY, FRACTURES): Vit D, 25-Hydroxy: 34.19 ng/mL (ref 30–100)

## 2021-02-05 LAB — VITAMIN B12: Vitamin B-12: 561 pg/mL (ref 180–914)

## 2021-02-11 NOTE — Progress Notes (Signed)
[place], [place] [address]   CLINIC:  Medical Oncology/Hematology  PCP:  [name], MD [address] / [place] [address]  [phone]  REASON FOR VISIT:  Follow-up for polycythemia vera  PRIOR THERAPY: none  CURRENT THERAPY: Intermittent phlebotomies last on 02/12/2020  INTERVAL HISTORY:  Brandon Maddox, a 70 y.o. male, returns for routine follow-up for his polycythemia vera. Brandon Maddox was last seen on 08/12/2020.  Today he reports feeling well. He reports stable fatigue, and he denies itching following showers.   REVIEW OF SYSTEMS:  Review of Systems  Constitutional:  Positive for fatigue (60%). Negative for appetite change.  Respiratory:  Positive for shortness of breath (with exertion).   Cardiovascular:  Positive for chest pain (A-fib).  Skin:  Negative for itching.  Neurological:  Positive for dizziness.  All other systems reviewed and are negative.  PAST MEDICAL/SURGICAL HISTORY:  Past Medical History:  Diagnosis Date   Acid reflux    Atrial fibrillation ([facility])    Closed right scapular fracture 15 yrs ago   Depression    GERD (gastroesophageal reflux disease)    Gout    Low testosterone    Polycythemia vera ([facility]) 01/17/2017   Prostate cancer ([facility])    Ribs, multiple fractures 1988 and 15 yrs ago   Sleep apnea    cpap 5 to 5   Wears glasses    Past Surgical History:  Procedure Laterality Date   COLONOSCOPY WITH PROPOFOL N/A 06/01/2018   Procedure: COLONOSCOPY WITH PROPOFOL;  Surgeon: Daneil Dolin, MD;  [facility];  Service: Endoscopy;  Laterality: N/A;  10:45am   CYSTOSCOPY N/A 06/26/2020   Procedure: CYSTOSCOPY;  Surgeon: Cleon Gustin, MD;  Location: [facility];  Service: Urology;  Laterality: N/A;   HIP SURGERY Right 2010   Dr. Dennis Bast and then replacement of right hip   LACERATION REPAIR  05/03/2012   Procedure: REPAIR MULTIPLE LACERATIONS;  Surgeon: Jerrell Belfast, MD;  Location: [facility];  Service: ENT;  Laterality: N/A;   PENILE PROSTHESIS IMPLANT     PENILE PROSTHESIS IMPLANT  2014   POLYPECTOMY  06/01/2018   Procedure: POLYPECTOMY;  Surgeon: Daneil Dolin, MD;  [facility];  Service: Endoscopy;;  cecum,hepatic flexure   Pyloric stenosis     55 weeks old   RADIOACTIVE SEED IMPLANT N/A 06/26/2020   Procedure: RADIOACTIVE SEED IMPLANT/BRACHYTHERAPY IMPLANT;  Surgeon: Cleon Gustin, MD;  Location: [facility];  Service: Urology;  Laterality: N/A;   SPACE OAR INSTILLATION N/A 06/26/2020   Procedure: SPACE OAR INSTILLATION;  Surgeon: Cleon Gustin, MD;  Location: [facility];  Service: Urology;  Laterality: N/A;    SOCIAL HISTORY:  Social History   Socioeconomic History   Marital status: Divorced    Spouse name: Not on file   Number of children: 1   Years of education: Not on file   Highest education level: Not on file  Occupational History    Employer: SELF EMPLOYED    Comment: retired  Tobacco Use   Smoking status: Former    Packs/day: 1.00    Years: 10.00    Pack years: 10.00    Types: Cigarettes    Quit date: 04/20/2003    Years since quitting: 17.8   Smokeless tobacco: Never  Vaping Use   Vaping Use: Never used  Substance and Sexual Activity   Alcohol use: No  Alcohol/week: 0.0 standard drinks    Comment: History of alcohol abuse in the past; none in 7 years   Drug use: No   Sexual activity: Yes  Other Topics Concern   Not on file  Social History Narrative   Not on file   Social Determinants of Health   Financial Resource Strain: Not on file  Food Insecurity: Not on file  Transportation Needs: Not on file  Physical Activity: Not on file  Stress: Not on file  Social Connections: Not on file  Intimate Partner Violence: Not on file    FAMILY HISTORY:  Family History  Problem Relation Age of Onset   Diabetes Mother    Breast cancer Mother    Diabetes Father     Colon cancer Neg Hx    Pancreatic cancer Neg Hx    Prostate cancer Neg Hx     CURRENT MEDICATIONS:  Current Outpatient Medications  Medication Sig Dispense Refill   allopurinol (ZYLOPRIM) 300 MG tablet Take 300 mg by mouth daily.      aspirin EC 81 MG tablet Take 81 mg by mouth daily.     buPROPion (WELLBUTRIN XL) 150 MG 24 hr tablet Take 450 mg by mouth daily.   4   metoprolol succinate (TOPROL XL) 25 MG 24 hr tablet Take 1 tablet (25 mg total) by mouth daily. 90 tablet 3   omeprazole (PRILOSEC OTC) 20 MG tablet Take 20 mg by mouth daily.      OVER THE COUNTER MEDICATION daily. Vitamin b 12 1000 mg daily     OVER THE COUNTER MEDICATION Vitamin d 1000 mg daily     Tamsulosin HCl (FLOMAX) 0.4 MG CAPS Take 0.4 mg by mouth daily.      HYDROcodone-acetaminophen (NORCO) 5-325 MG tablet Take 1 tablet by mouth every 6 (six) hours as needed for moderate pain. (Patient not taking: Reported on 02/12/2021) 30 tablet 0   No current facility-administered medications for this visit.    ALLERGIES:  Allergies  Allergen Reactions   Penicillins Other (See Comments)    Mother told him throat swelled up when he took at a young age  Did it involve swelling of the face/tongue/throat, SOB, or low BP? Yes Did it involve sudden or severe rash/hives, skin peeling, or any reaction on the inside of your mouth or nose? Unknown Did you need to seek medical attention at a hospital or doctor's office? Yes When did it last happen?  Childhood reaction    If all above answers are "NO", may proceed with cephalosporin use.     PHYSICAL EXAM:  Performance status (ECOG): 1 - Symptomatic but completely ambulatory  Vitals:   02/12/21 1508  BP: 128/80  Pulse: 80  Resp: 18  Temp: 97.6 F (36.4 C)  SpO2: 97%   Wt Readings from Last 3 Encounters:  02/12/21 221 lb 9 oz (100.5 kg)  08/12/20 227 lb 9.6 oz (103.2 kg)  07/23/20 223 lb 6.4 oz (101.3 kg)   Physical Exam Vitals reviewed.  Constitutional:       Appearance: Normal appearance.  Cardiovascular:     Rate and Rhythm: Normal rate. Rhythm irregular.     Pulses: Normal pulses.     Heart sounds: Normal heart sounds.  Pulmonary:     Effort: Pulmonary effort is normal.     Breath sounds: Normal breath sounds.  Neurological:     General: No focal deficit present.     Mental Status: He is alert and oriented to person,  place, and time.  Psychiatric:        Mood and Affect: Mood normal.        Behavior: Behavior normal.    LABORATORY DATA:  I have reviewed the labs as listed.  CBC Latest Ref Rng & Units 02/05/2021 08/05/2020 06/23/2020  WBC 4.0 - 10.5 K/uL 5.5 7.3 6.3  Hemoglobin 13.0 - 17.0 g/dL 14.8 15.8 15.2  Hematocrit 39.0 - 52.0 % 47.8 50.0 48.3  Platelets 150 - 400 K/uL 229 235 217   CMP Latest Ref Rng & Units 08/05/2020 06/23/2020 01/29/2020  Glucose 70 - 99 mg/dL 113(H) 87 93  BUN 8 - 23 mg/dL 22 20 19   Creatinine 0.61 - 1.24 mg/dL 1.40(H) 1.08 1.14  Sodium 135 - 145 mmol/L 136 138 134(L)  Potassium 3.5 - 5.1 mmol/L 4.1 4.4 5.2(H)  Chloride 98 - 111 mmol/L 102 108 99  CO2 22 - 32 mmol/L 25 23 27   Calcium 8.9 - 10.3 mg/dL 9.0 9.3 9.5  Total Protein 6.5 - 8.1 g/dL 7.1 6.9 6.9  Total Bilirubin 0.3 - 1.2 mg/dL 0.4 0.9 0.9  Alkaline Phos 38 - 126 U/L 78 62 72  AST 15 - 41 U/L 16 17 16   ALT 0 - 44 U/L 16 16 18       Component Value Date/Time   RBC 5.70 02/05/2021 1315   MCV 83.9 02/05/2021 1315   MCH 26.0 02/05/2021 1315   MCHC 31.0 02/05/2021 1315   RDW 16.7 (H) 02/05/2021 1315   LYMPHSABS 1.4 02/05/2021 1315   MONOABS 0.4 02/05/2021 1315   EOSABS 0.1 02/05/2021 1315   BASOSABS 0.1 02/05/2021 1315    DIAGNOSTIC IMAGING:  I have independently reviewed the scans and discussed with the patient. No results found.   ASSESSMENT:  1.  Secondary polycythemia: -Jak 2 V6 21F and reflex testing was negative. - Initially thought to be secondary to testosterone supplements.  He has been off of testosterone since July 2018  however he continues to have secondary polycythemia. - Last phlebotomy was in December 2019. - He does not have any vasomotor symptoms.  No aquagenic pruritus.  No erythromelalgia's. - BCR/ABL by PCR showed trace positivity for B2 A2 transcript.  Will consider doing FISH testing at some point. -Bone marrow biopsy November 2018 showed slightly hypercellular bone marrow for age with trilineage hematopoiesis.  No definite morphological evidence of myeloproliferative disorder. -Last phlebotomy was in February 2021.   2.  T1c prostate cancer: - Gleason 3+3, PSA 5.1. - Status post seed implants on 06/26/2020.   PLAN:  1.  Secondary polycythemia: - Last phlebotomy was on 08/12/2020. - He reported energy levels improved after last phlebotomy which lasted about 4 to 5 months. - He denies any aquagenic pruritus.  Atrial fibrillation is rate controlled. - Recommend phlebotomy x1 to improve fatigue. - RTC 6 months with repeat CBC.   2.  Vitamin D deficiency: - Vitamin D is 34.  Continue vitamin D 1000 units daily.   3.  Vitamin B12 deficiency: - He is taking B12 1 mg tablet every other day.  B12 level is 561. - Continue the same regimen.  Orders placed this encounter:  No orders of the defined types were placed in this encounter.    Derek Jack, MD [place] [phone]   I, Thana Ates, am acting as a scribe for Dr. Derek Jack.  I, Derek Jack MD, have reviewed the above documentation for accuracy and completeness, and I agree with the above.

## 2021-02-12 ENCOUNTER — Other Ambulatory Visit: Payer: Self-pay

## 2021-02-12 ENCOUNTER — Inpatient Hospital Stay (HOSPITAL_COMMUNITY): Payer: PPO | Admitting: Hematology

## 2021-02-12 VITALS — BP 128/80 | HR 80 | Temp 97.6°F | Resp 18 | Wt 221.6 lb

## 2021-02-12 DIAGNOSIS — D45 Polycythemia vera: Secondary | ICD-10-CM

## 2021-02-12 DIAGNOSIS — D751 Secondary polycythemia: Secondary | ICD-10-CM | POA: Diagnosis not present

## 2021-02-12 NOTE — Patient Instructions (Addendum)
[place] at [facility] Discharge Instructions  You were seen and examined today by Dr. Delton Coombes. He has reviewed your most recent labs and recommended that you do Phlebotomy. Please follow up as scheduled.   Thank you for choosing [place] [facility] to provide your oncology and hematology care.  To afford each patient quality time with our provider, please arrive at least 15 minutes before your scheduled appointment time.   If you have a lab appointment with the [facility] please come in thru the Main Entrance and check in at the main information desk.  You need to re-schedule your appointment should you arrive 10 or more minutes late.  We strive to give you quality time with our providers, and arriving late affects you and other patients whose appointments are after yours.  Also, if you no show three or more times for appointments you may be dismissed from the clinic at the providers discretion.     Again, thank you for choosing [facility].  Our hope is that these requests will decrease the amount of time that you wait before being seen by our physicians.       _____________________________________________________________  Should you have questions after your visit to [facility], please contact our office at [phone] and follow the prompts.  Our office hours are 8:00 a.m. and 4:30 p.m. Monday - Friday.  Please note that voicemails left after 4:00 p.m. may not be returned until the following business day.  We are closed weekends and major holidays.  You do have access to a nurse 24-7, just call the main number to the clinic [phone] and do not press any options, hold on the line and a nurse will answer the phone.    For prescription refill requests, have your pharmacy contact our office and allow 72 hours.    Due to Covid, you will need to wear a mask upon entering the hospital. If you do not have a  mask, a mask will be given to you at the Main Entrance upon arrival. For doctor visits, patients may have 1 support person age 3 or older with them. For treatment visits, patients can not have anyone with them due to social distancing guidelines and our immunocompromised population.

## 2021-02-17 ENCOUNTER — Encounter (HOSPITAL_COMMUNITY): Payer: Self-pay | Admitting: *Deleted

## 2021-02-17 ENCOUNTER — Inpatient Hospital Stay (HOSPITAL_COMMUNITY): Payer: PPO | Attending: Hematology

## 2021-02-17 ENCOUNTER — Other Ambulatory Visit: Payer: Self-pay

## 2021-02-17 DIAGNOSIS — Z79899 Other long term (current) drug therapy: Secondary | ICD-10-CM | POA: Diagnosis not present

## 2021-02-17 DIAGNOSIS — D751 Secondary polycythemia: Secondary | ICD-10-CM | POA: Insufficient documentation

## 2021-02-17 NOTE — Patient Instructions (Signed)
[facility]  Discharge Instructions: Thank you for choosing [facility] to provide your oncology and hematology care.  If you have a lab appointment with the [facility], please come in thru the Main Entrance and check in at the main information desk.  Wear comfortable clothing and clothing appropriate for easy access to any Portacath or PICC line.   We strive to give you quality time with your provider. You may need to reschedule your appointment if you arrive late (15 or more minutes).  Arriving late affects you and other patients whose appointments are after yours.  Also, if you miss three or more appointments without notifying the office, you may be dismissed from the clinic at the provider's discretion.      For prescription refill requests, have your pharmacy contact our office and allow 72 hours for refills to be completed.    Today you received therapeutic phlebotomy.      BELOW ARE SYMPTOMS THAT SHOULD BE REPORTED IMMEDIATELY: *FEVER GREATER THAN 100.4 F (38 C) OR HIGHER *CHILLS OR SWEATING *NAUSEA AND VOMITING THAT IS NOT CONTROLLED WITH YOUR NAUSEA MEDICATION *UNUSUAL SHORTNESS OF BREATH *UNUSUAL BRUISING OR BLEEDING *URINARY PROBLEMS (pain or burning when urinating, or frequent urination) *BOWEL PROBLEMS (unusual diarrhea, constipation, pain near the anus) TENDERNESS IN MOUTH AND THROAT WITH OR WITHOUT PRESENCE OF ULCERS (sore throat, sores in mouth, or a toothache) UNUSUAL RASH, SWELLING OR PAIN  UNUSUAL VAGINAL DISCHARGE OR ITCHING   Items with * indicate a potential emergency and should be followed up as soon as possible or go to the Emergency Department if any problems should occur.  Please show the CHEMOTHERAPY ALERT CARD or IMMUNOTHERAPY ALERT CARD at check-in to the Emergency Department and triage nurse.  Should you have questions after your visit or need to cancel or reschedule your appointment, please contact [facility]  [phone]  and follow the prompts.  Office hours are 8:00 a.m. to 4:30 p.m. Monday - Friday. Please note that voicemails left after 4:00 p.m. may not be returned until the following business day.  We are closed weekends and major holidays. You have access to a nurse at all times for urgent questions. Please call the main number to the clinic [phone] and follow the prompts.  For any non-urgent questions, you may also contact your provider using MyChart. We now offer e-Visits for anyone 62 and older to request care online for non-urgent symptoms. For details visit [email or web address].   Also download the MyChart app! Go to the app store, search "MyChart", open the app, select [facility], and log in with your MyChart username and password.  Due to Covid, a mask is required upon entering the hospital/clinic. If you do not have a mask, one will be given to you upon arrival. For doctor visits, patients may have 1 support person aged 40 or older with them. For treatment visits, patients cannot have anyone with them due to current Covid guidelines and our immunocompromised population.

## 2021-02-17 NOTE — Progress Notes (Signed)
.  Brandon Maddox presents today for theraputic phlebotomy per MD orders. Last  hgb 14.8/ 47.8 hct on  was 02/05/21.VSS prior to procedure. Pt reports eating before arrival. Procedure started at 1448 using patients left AC. 500 grams of blood removed. Procedure ended at 1457. Gauze and coban applied to [facility], site clean and dry. VSS upon completion of procedure. Pt denies dizziness, lightheadedness, or feeling faint.Discharged in satisfactory condition with follow up instructions.

## 2021-02-25 ENCOUNTER — Encounter: Payer: Self-pay | Admitting: Cardiology

## 2021-02-25 NOTE — Progress Notes (Addendum)
Cardiology Office Note  Date: 02/26/2021   ID: Brandon BOISSONNEAULT, DOB [birth year], MRN [id number]  PCP:  [name], MD  Cardiologist:  Rozann Lesches, MD Electrophysiologist:  None   Chief Complaint  Patient presents with   Cardiac follow-up     History of Present Illness: Brandon Maddox is a 70 y.o. male last seen in March by Mr. Leonides Sake NP.  He is here for a follow-up visit.  He complains of worsening dyspnea on exertion, present for several months.  He has not had any chest tightness with exertion, no definite sense of palpitations.  He has atrial fibrillation managed as permanent atrial fibrillation with heart rate control strategy and CHA2DS2-VASc score of 1 based on current assessment.  I reviewed his medications which are listed below.  Heart rate is well controlled on current dose of Toprol-XL.  To this point he has been on aspirin 81 mg daily.  He has not undergone any recent cardiac structural or ischemic testing.  Echocardiogram done back in 2017 revealed LVEF 55 to 60%.  He is enjoying retirement.  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  Past Medical History:  Diagnosis Date   Acid reflux    Atrial fibrillation (HCC)    Closed right scapular fracture    Depression    GERD (gastroesophageal reflux disease)    Gout    Low testosterone    OSA on CPAP    Polycythemia vera ([place]) 01/17/2017   Prostate cancer ([place])    Ribs, multiple fractures    Wears glasses     Past Surgical History:  Procedure Laterality Date   COLONOSCOPY WITH PROPOFOL N/A 06/01/2018   Procedure: COLONOSCOPY WITH PROPOFOL;  Surgeon: Daneil Dolin, MD;  [facility];  Service: Endoscopy;  Laterality: N/A;  10:45am   CYSTOSCOPY N/A 06/26/2020   Procedure: CYSTOSCOPY;  Surgeon: Cleon Gustin, MD;  [facility]: [facility];  Service: Urology;  Laterality: N/A;   HIP SURGERY Right 2010   Dr. 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      apixaban (ELIQUIS) 5 MG TABS tablet Take 1 tablet (5 mg total) by mouth 2 (two) times daily. 60 tablet 6   buPROPion (WELLBUTRIN XL) 150 MG 24 hr tablet Take 450 mg by mouth daily.   4   HYDROcodone-acetaminophen (NORCO) 5-325 MG tablet Take 1 tablet by mouth every 6 (six) hours as needed for moderate pain. 30 tablet 0   metoprolol succinate (  TOPROL XL) 25 MG 24 hr tablet Take 1 tablet (25 mg total) by mouth daily. 90 tablet 3   omeprazole (PRILOSEC OTC) 20 MG tablet Take 20 mg by mouth daily.      OVER THE COUNTER MEDICATION daily. Vitamin b 12 1000 mg daily     OVER THE COUNTER MEDICATION Vitamin d 1000 mg daily     Tamsulosin HCl (FLOMAX) 0.4 MG CAPS Take 0.4 mg by mouth daily.      No current facility-administered medications for this visit.   Allergies:  Penicillins   Social History: The patient  reports that he quit smoking about 17 years ago. His smoking use included cigarettes. He has a 10.00 pack-year smoking history. He has  never used smokeless tobacco. He reports that he does not drink alcohol and does not use drugs.   Family History: The patient's family history includes Breast cancer in his mother; Diabetes in his father and mother.   ROS: No orthopnea or PND.  Physical Exam: VS:  BP 122/80 (BP Location: Left Arm, Patient Position: Sitting, Cuff Size: Normal)   Pulse 74   Ht 5\' 10"  (1.778 m)   Wt 220 lb (99.8 kg)   SpO2 98%   BMI 31.57 kg/m , BMI Body mass index is 31.57 kg/m.  Wt Readings from Last 3 Encounters:  02/26/21 220 lb (99.8 kg)  02/12/21 221 lb 9 oz (100.5 kg)  08/12/20 227 lb 9.6 oz (103.2 kg)    General: Patient appears comfortable at rest. HEENT: Conjunctiva and lids normal, wearing a mask. Lungs: Clear to auscultation, nonlabored breathing at rest. Cardiac: Irregularly irregular, no S3, 1/6 systolic murmur, no pericardial rub. Abdomen: Soft, nontender, bowel sounds present. Extremities: No pitting edema, distal pulses 2+. Skin: Warm and dry. Musculoskeletal: No kyphosis. Neuropsychiatric: Alert and oriented x3, affect grossly appropriate.  ECG:  An ECG dated 05/22/2020 was personally reviewed today and demonstrated:  Atrial fibrillation with nonspecific T wave changes.  Recent Labwork: 08/05/2020: ALT 16; AST 16; BUN 22; Creatinine, Ser 1.40; Potassium 4.1; Sodium 136 02/05/2021: Hemoglobin 14.8; Platelets 229  August 2022: Cholesterol 147, triglycerides 163, HDL 36, LDL 83, hemoglobin A1c 5.9%  Other Studies Reviewed Today:  Echocardiogram 12/16/2015: - Left ventricle: The cavity size was normal. Wall thickness was    increased in a pattern of mild LVH. Systolic function was normal.    The estimated ejection fraction was in the range of 55% to 60%.    Wall motion was normal; there were no regional wall motion    abnormalities. The study is not technically sufficient to allow    evaluation of LV diastolic function.   Assessment and Plan:  1.  Progressive dyspnea on  exertion in a 70 year old male with atrial fibrillation, remote tobacco use, OSA on CPAP, PCV, and prostate cancer.  Not entirely clear that atrial fibrillation is the cause of his symptoms, but this is a possibility to consider.  No recent cardiac structural or ischemic work-up however.  For now our plan is to take him off aspirin and initiate Eliquis 5 mg twice daily with an eye toward elective cardioversion attempt in 3 to 4 weeks.  Prior to that however we will obtain an echocardiogram and a Lexiscan Myoview as well.  Continue current dose of Toprol-XL.  2.  Atrial fibrillation, managed so far as permanent atrial fibrillation with rate control strategy.  CHA2DS2-VASc score 1 based on current work-up.  He has been on low-dose aspirin along with Toprol-XL.  Medication  Adjustments/Labs and Tests Ordered: Current medicines are reviewed at length with the patient today.  Concerns regarding medicines are outlined above.   Tests Ordered: Orders Placed This Encounter  Procedures   NM Myocar Multi W/Spect W/Wall Motion / EF   ECHOCARDIOGRAM COMPLETE     Medication Changes: Meds ordered this encounter  Medications   apixaban (ELIQUIS) 5 MG TABS tablet    Sig: Take 1 tablet (5 mg total) by mouth 2 (two) times daily.    Dispense:  60 tablet    Refill:  6    02/26/2021 NEW     Disposition:  Follow up  test results.  Signed, Satira Sark, MD, [facility] 02/26/2021 2:01 PM    [facility] [place], [place], [place] [address] Phone: [phone]; Fax: [phone]    Addendum 03/26/2021:  Follow-up echocardiogram in November revealed LVEF 55 to 60% with severely dilated left atrium.  Lexiscan Myoview was negative for ischemia.  He was started on flecainide 50 mg twice daily and continued on Eliquis with plan to schedule elective cardioversion.  Procedure has been scheduled for December 15.  Satira Sark, M.D., F.A.C.C.

## 2021-02-26 ENCOUNTER — Other Ambulatory Visit: Payer: Self-pay

## 2021-02-26 ENCOUNTER — Encounter: Payer: Self-pay | Admitting: *Deleted

## 2021-02-26 ENCOUNTER — Telehealth: Payer: Self-pay | Admitting: Cardiology

## 2021-02-26 ENCOUNTER — Encounter: Payer: Self-pay | Admitting: Cardiology

## 2021-02-26 ENCOUNTER — Ambulatory Visit: Payer: PPO | Admitting: Cardiology

## 2021-02-26 VITALS — BP 122/80 | HR 74 | Ht 70.0 in | Wt 220.0 lb

## 2021-02-26 DIAGNOSIS — R9431 Abnormal electrocardiogram [ECG] [EKG]: Secondary | ICD-10-CM

## 2021-02-26 DIAGNOSIS — R0609 Other forms of dyspnea: Secondary | ICD-10-CM

## 2021-02-26 DIAGNOSIS — I4821 Permanent atrial fibrillation: Secondary | ICD-10-CM

## 2021-02-26 MED ORDER — APIXABAN 5 MG PO TABS: 5.0000 mg | ORAL_TABLET | Freq: Two times a day (BID) | ORAL | 6 refills | Status: DC

## 2021-02-26 NOTE — Patient Instructions (Addendum)
Medication Instructions:  Your physician has recommended you make the following change in your medication:  Stop aspirin Start eliquis 5 mg twice daily-take 30 day free voucher to your pharmacy Continue other medications the same  Labwork: none  Testing/Procedures: Your physician has requested that you have an echocardiogram. Echocardiography is a painless test that uses sound waves to create images of your heart. It provides your doctor with information about the size and shape of your heart and how well your heart's chambers and valves are working. This procedure takes approximately one hour. There are no restrictions for this procedure. Your physician has requested that you have a lexiscan myoview. For further information please visit [email or web address]. Please follow instruction sheet, as given.  Follow-Up: Your physician recommends that you schedule a follow-up appointment in: pending  Any Other Special Instructions Will Be Listed Below (If Applicable).  If you need a refill on your cardiac medications before your next appointment, please call your pharmacy.

## 2021-02-26 NOTE — Telephone Encounter (Signed)
Checking percert on the following patient for testing scheduled at [facility].     LEXISCAN   03/06/2021  ECHO      02/27/2021

## 2021-02-27 ENCOUNTER — Ambulatory Visit (HOSPITAL_COMMUNITY)
Admission: RE | Admit: 2021-02-27 | Discharge: 2021-02-27 | Disposition: A | Discharge status: Home / Self Care | Payer: PPO | Source: Ambulatory Visit | Attending: Cardiology | Admitting: Cardiology

## 2021-02-27 DIAGNOSIS — R9431 Abnormal electrocardiogram [ECG] [EKG]: Secondary | ICD-10-CM | POA: Diagnosis not present

## 2021-02-27 DIAGNOSIS — R0609 Other forms of dyspnea: Secondary | ICD-10-CM | POA: Insufficient documentation

## 2021-02-27 LAB — ECHOCARDIOGRAM COMPLETE
AR max vel: 1.88 cm2
AV Area VTI: 2 cm2
AV Area mean vel: 1.77 cm2
AV Mean grad: 2.8 mmHg
AV Peak grad: 4.6 mmHg
Ao pk vel: 1.07 m/s
Area-P 1/2: 5.02 cm2
S' Lateral: 3.7 cm

## 2021-02-27 NOTE — Progress Notes (Signed)
*  PRELIMINARY RESULTS* Echocardiogram 2D Echocardiogram has been performed.  Brandon Maddox 02/27/2021, 1:42 PM

## 2021-03-06 ENCOUNTER — Encounter (HOSPITAL_COMMUNITY): Payer: Self-pay

## 2021-03-06 ENCOUNTER — Ambulatory Visit (HOSPITAL_COMMUNITY)
Admission: RE | Admit: 2021-03-06 | Discharge: 2021-03-06 | Disposition: A | Discharge status: Home / Self Care | Payer: PPO | Source: Ambulatory Visit | Attending: Cardiology | Admitting: Cardiology

## 2021-03-06 ENCOUNTER — Encounter (HOSPITAL_COMMUNITY)
Admission: RE | Admit: 2021-03-06 | Discharge: 2021-03-06 | Disposition: A | Discharge status: Home / Self Care | Payer: PPO | Source: Ambulatory Visit | Attending: Cardiology | Admitting: Cardiology

## 2021-03-06 ENCOUNTER — Other Ambulatory Visit: Payer: Self-pay

## 2021-03-06 DIAGNOSIS — R9431 Abnormal electrocardiogram [ECG] [EKG]: Secondary | ICD-10-CM | POA: Diagnosis not present

## 2021-03-06 DIAGNOSIS — R0609 Other forms of dyspnea: Secondary | ICD-10-CM | POA: Diagnosis not present

## 2021-03-06 LAB — NM MYOCAR MULTI W/SPECT W/WALL MOTION / EF
LV dias vol: 74 mL (ref 62–150)
LV sys vol: 17 mL
Nuc Stress EF: 77 %
Peak HR: 91 {beats}/min
RATE: 0.4
Rest HR: 76 {beats}/min
Rest Nuclear Isotope Dose: 7.6 mCi
SDS: 3
SRS: 0
SSS: 3
ST Depression (mm): 0 mm
Stress Nuclear Isotope Dose: 22.2 mCi
TID: 1.11

## 2021-03-06 MED ORDER — TECHNETIUM TC 99M TETROFOSMIN IV KIT
7.0000 | PACK | Freq: Once | INTRAVENOUS | Status: AC | PRN
  Administered 2021-03-06: 7.6 via INTRAVENOUS

## 2021-03-06 MED ORDER — REGADENOSON 0.4 MG/5ML IV SOLN
INTRAVENOUS | Status: AC
  Administered 2021-03-06: 0.4 mg via INTRAVENOUS
  Filled 2021-03-06: qty 5

## 2021-03-06 MED ORDER — TECHNETIUM TC 99M TETROFOSMIN IV KIT
21.0000 | PACK | Freq: Once | INTRAVENOUS | Status: AC | PRN
  Administered 2021-03-06: 22.2 via INTRAVENOUS

## 2021-03-06 MED ORDER — SODIUM CHLORIDE FLUSH 0.9 % IV SOLN
INTRAVENOUS | Status: AC
  Administered 2021-03-06: 10 mL via INTRAVENOUS
  Filled 2021-03-06: qty 10

## 2021-03-10 ENCOUNTER — Telehealth: Payer: Self-pay | Admitting: *Deleted

## 2021-03-10 MED ORDER — FLECAINIDE ACETATE 50 MG PO TABS: 50.0000 mg | ORAL_TABLET | Freq: Two times a day (BID) | ORAL | 1 refills | Status: DC

## 2021-03-10 NOTE — Telephone Encounter (Signed)
-----   Message from Satira Sark, MD sent at 03/06/2021  2:21 PM EST ----- Results reviewed.  Please let him know that the stress test looked good, no ischemia to suggest obstructive CAD and normal LVEF.  Recent echocardiogram reviewed as well.  I think at this point we should start him on flecainide 50 mg twice daily in preparation for cardioversion attempt in the next 3 to 4 weeks on Eliquis.  Have him come in for an ECG a week after starting the flecainide.  We can make plans for outpatient elective cardioversion in 3 to 4 weeks with consistent use of Eliquis.

## 2021-03-10 NOTE — Telephone Encounter (Signed)
Patient informed and verbalized understanding of plan. Will schedule DCCV after nurse visit on 03/18/21 Copy sent to PCP

## 2021-03-18 ENCOUNTER — Ambulatory Visit (INDEPENDENT_AMBULATORY_CARE_PROVIDER_SITE_OTHER): Payer: PPO | Admitting: *Deleted

## 2021-03-18 ENCOUNTER — Other Ambulatory Visit: Payer: Self-pay

## 2021-03-18 DIAGNOSIS — I4891 Unspecified atrial fibrillation: Secondary | ICD-10-CM

## 2021-03-18 NOTE — Progress Notes (Signed)
Pt reports that after starting flecainide that he gets lightheaded when standing at times. No other complaints at this time.

## 2021-03-19 ENCOUNTER — Telehealth: Payer: Self-pay | Admitting: *Deleted

## 2021-03-19 NOTE — Telephone Encounter (Signed)
-----   Message from Merlene Laughter, RN sent at 03/10/2021 12:50 PM EST ----- Regarding: SCHEDULE DCCV AFTER NURSE VISIT ON 03/18/21 MAKE SURE HE STILL NEED DCCV SCHEDULED CAN BE SCHEDULED 3-4 WEEKS AFTER ELIQUIS START (02/26/21)

## 2021-03-26 ENCOUNTER — Other Ambulatory Visit: Payer: Self-pay | Admitting: Cardiology

## 2021-03-26 ENCOUNTER — Encounter: Payer: Self-pay | Admitting: *Deleted

## 2021-03-26 DIAGNOSIS — I4819 Other persistent atrial fibrillation: Secondary | ICD-10-CM

## 2021-03-26 NOTE — Telephone Encounter (Signed)
Patient informed and verbalized understanding of plan. 

## 2021-03-26 NOTE — Telephone Encounter (Signed)
DCCV 04/02/21 @9 :30 am @[facility]  with Dr. Domenic Polite Pre-Admission @[facility]  appointment 03/31/21 @2 :45 pm Can call [phone] if he need reschedule Pre-Admission appointment per Hoyle Sauer

## 2021-03-27 NOTE — Patient Instructions (Addendum)
Brandon Maddox  03/27/2021     @PREFPERIOPPHARMACY @   Your procedure is scheduled on  04/02/2021.   Report to [facility] at  0800  A.M.   Call this number if you have problems the morning of surgery:  [phone]   Remember:  Do not eat or drink after midnight.      DO NOT miss any doses of your eliquis.     Take these medicines the morning of surgery with A SIP OF WATER                   allopurinol, wellbutrin, flecanide, prilosec, flomax.     Do not wear jewelry, make-up or nail polish.  Do not wear lotions, powders, or perfumes, or deodorant.  Do not shave 48 hours prior to surgery.  Men may shave face and neck.  Do not bring valuables to the hospital.  [facility] is not responsible for any belongings or valuables.  Contacts, dentures or bridgework may not be worn into surgery.  Leave your suitcase in the car.  After surgery it may be brought to your room.  For patients admitted to the hospital, discharge time will be determined by your treatment team.  Patients discharged the day of surgery will not be allowed to drive home and must have someone with them for 24 hours.    Special instructions:   DO NOT smoke tobacco or vape for 24 hours before your procedure.  Please read over the following fact sheets that you were given. Coughing and Deep Breathing, Anesthesia Post-op Instructions, and Care and Recovery After Surgery       Electrical Cardioversion Electrical cardioversion is the delivery of a jolt of electricity to restore a normal rhythm to the heart. A rhythm that is too fast or is not regular keeps the heart from pumping well. In this procedure, sticky patches or metal paddles are placed on the chest to deliver electricity to the heart from a device. This procedure may be done in an emergency if: There is low or no blood pressure as a result of the heart rhythm. Normal rhythm must be restored as fast as possible to protect the brain and heart  from further damage. It may save a life. This may also be a scheduled procedure for irregular or fast heart rhythms that are not immediately life-threatening. Tell a health care provider about: Any allergies you have. All medicines you are taking, including vitamins, herbs, eye drops, creams, and over-the-counter medicines. Any problems you or family members have had with anesthetic medicines. Any blood disorders you have. Any surgeries you have had. Any medical conditions you have. Whether you are pregnant or may be pregnant. What are the risks? Generally, this is a safe procedure. However, problems may occur, including: Allergic reactions to medicines. A blood clot that breaks free and travels to other parts of your body. The possible return of an abnormal heart rhythm within hours or days after the procedure. Your heart stopping (cardiac arrest). This is rare. What happens before the procedure? Medicines Your health care provider may have you start taking: Blood-thinning medicines (anticoagulants) so your blood does not clot as easily. Medicines to help stabilize your heart rate and rhythm. Ask your health care provider about: Changing or stopping your regular medicines. This is especially important if you are taking diabetes medicines or blood thinners. Taking medicines such as aspirin and ibuprofen. These medicines can thin your blood. Do not  take these medicines unless your health care provider tells you to take them. Taking over-the-counter medicines, vitamins, herbs, and supplements. General instructions Follow instructions from your health care provider about eating or drinking restrictions. Plan to have someone take you home from the hospital or clinic. If you will be going home right after the procedure, plan to have someone with you for 24 hours. Ask your health care provider what steps will be taken to help prevent infection. These may include washing your skin with a  germ-killing soap. What happens during the procedure?  An IV will be inserted into one of your veins. Sticky patches (electrodes) or metal paddles may be placed on your chest. You will be given a medicine to help you relax (sedative). An electrical shock will be delivered. The procedure may vary among health care providers and hospitals. What can I expect after the procedure? Your blood pressure, heart rate, breathing rate, and blood oxygen level will be monitored until you leave the hospital or clinic. Your heart rhythm will be watched to make sure it does not change. You may have some redness on the skin where the shocks were given. Follow these instructions at home: Do not drive for 24 hours if you were given a sedative during your procedure. Take over-the-counter and prescription medicines only as told by your health care provider. Ask your health care provider how to check your pulse. Check it often. Rest for 48 hours after the procedure or as told by your health care provider. Avoid or limit your caffeine use as told by your health care provider. Keep all follow-up visits as told by your health care provider. This is important. Contact a health care provider if: You feel like your heart is beating too quickly or your pulse is not regular. You have a serious muscle cramp that does not go away. Get help right away if: You have discomfort in your chest. You are dizzy or you feel faint. You have trouble breathing or you are short of breath. Your speech is slurred. You have trouble moving an arm or leg on one side of your body. Your fingers or toes turn cold or blue. Summary Electrical cardioversion is the delivery of a jolt of electricity to restore a normal rhythm to the heart. This procedure may be done right away in an emergency or may be a scheduled procedure if the condition is not an emergency. Generally, this is a safe procedure. After the procedure, check your pulse often  as told by your health care provider. This information is not intended to replace advice given to you by your health care provider. Make sure you discuss any questions you have with your health care provider. Document Revised: 11/06/2018 Document Reviewed: 11/06/2018 Elsevier Patient Education  [place] After This sheet gives you information about how to care for yourself after your procedure. Your health care provider may also give you more specific instructions. If you have problems or questions, contact your health care provider. What can I expect after the procedure? After the procedure, it is common to have: Tiredness. Forgetfulness about what happened after the procedure. Impaired judgment for important decisions. Nausea or vomiting. Some difficulty with balance. Follow these instructions at home: For the time period you were told by your health care provider:   Rest as needed. Do not participate in activities where you could fall or become injured. Do not drive or use machinery. Do not drink alcohol. Do not  take sleeping pills or medicines that cause drowsiness. Do not make important decisions or sign legal documents. Do not take care of children on your own. Eating and drinking Follow the diet that is recommended by your health care provider. Drink enough fluid to keep your urine pale yellow. If you vomit: Drink water, juice, or soup when you can drink without vomiting. Make sure you have little or no nausea before eating solid foods. General instructions Have a responsible adult stay with you for the time you are told. It is important to have someone help care for you until you are awake and alert. Take over-the-counter and prescription medicines only as told by your health care provider. If you have sleep apnea, surgery and certain medicines can increase your risk for breathing problems. Follow instructions from your health care  provider about wearing your sleep device: Anytime you are sleeping, including during daytime naps. While taking prescription pain medicines, sleeping medicines, or medicines that make you drowsy. Avoid smoking. Keep all follow-up visits as told by your health care provider. This is important. Contact a health care provider if: You keep feeling nauseous or you keep vomiting. You feel light-headed. You are still sleepy or having trouble with balance after 24 hours. You develop a rash. You have a fever. You have redness or swelling around the IV site. Get help right away if: You have trouble breathing. You have new-onset confusion at home. Summary For several hours after your procedure, you may feel tired. You may also be forgetful and have poor judgment. Have a responsible adult stay with you for the time you are told. It is important to have someone help care for you until you are awake and alert. Rest as told. Do not drive or operate machinery. Do not drink alcohol or take sleeping pills. Get help right away if you have trouble breathing, or if you suddenly become confused. This information is not intended to replace advice given to you by your health care provider. Make sure you discuss any questions you have with your health care provider. Document Revised: 12/20/2019 Document Reviewed: 03/08/2019 Elsevier Patient Education  2022 [employer].

## 2021-03-31 ENCOUNTER — Other Ambulatory Visit: Payer: Self-pay

## 2021-03-31 ENCOUNTER — Encounter (HOSPITAL_COMMUNITY)
Admission: RE | Admit: 2021-03-31 | Discharge: 2021-03-31 | Disposition: A | Discharge status: Home / Self Care | Payer: PPO | Source: Ambulatory Visit | Attending: Cardiology | Admitting: Cardiology

## 2021-03-31 DIAGNOSIS — I4819 Other persistent atrial fibrillation: Secondary | ICD-10-CM | POA: Diagnosis not present

## 2021-03-31 DIAGNOSIS — Z01812 Encounter for preprocedural laboratory examination: Secondary | ICD-10-CM | POA: Insufficient documentation

## 2021-03-31 LAB — BASIC METABOLIC PANEL
Anion gap: 9 (ref 5–15)
BUN: 22 mg/dL (ref 8–23)
CO2: 23 mmol/L (ref 22–32)
Calcium: 9 mg/dL (ref 8.9–10.3)
Chloride: 102 mmol/L (ref 98–111)
Creatinine, Ser: 1.43 mg/dL — ABNORMAL HIGH (ref 0.61–1.24)
GFR, Estimated: 53 mL/min — ABNORMAL LOW (ref >60)
Glucose, Bld: 125 mg/dL — ABNORMAL HIGH (ref 70–99)
Potassium: 4.7 mmol/L (ref 3.5–5.1)
Sodium: 134 mmol/L — ABNORMAL LOW (ref 135–145)

## 2021-04-02 ENCOUNTER — Encounter (HOSPITAL_COMMUNITY): Payer: Self-pay | Admitting: Cardiology

## 2021-04-02 ENCOUNTER — Other Ambulatory Visit: Payer: Self-pay

## 2021-04-02 ENCOUNTER — Ambulatory Visit (HOSPITAL_COMMUNITY): Payer: PPO | Admitting: Anesthesiology

## 2021-04-02 ENCOUNTER — Encounter (HOSPITAL_COMMUNITY)
Admission: RE | Disposition: A | Discharge status: Home / Self Care | Payer: Self-pay | Source: Home / Self Care | Attending: Cardiology

## 2021-04-02 ENCOUNTER — Ambulatory Visit (HOSPITAL_COMMUNITY)
Admission: RE | Admit: 2021-04-02 | Discharge: 2021-04-02 | Disposition: A | Discharge status: Home / Self Care | Payer: PPO | Attending: Cardiology | Admitting: Cardiology

## 2021-04-02 DIAGNOSIS — K219 Gastro-esophageal reflux disease without esophagitis: Secondary | ICD-10-CM | POA: Diagnosis not present

## 2021-04-02 DIAGNOSIS — Z79899 Other long term (current) drug therapy: Secondary | ICD-10-CM | POA: Insufficient documentation

## 2021-04-02 DIAGNOSIS — E119 Type 2 diabetes mellitus without complications: Secondary | ICD-10-CM | POA: Diagnosis not present

## 2021-04-02 DIAGNOSIS — Z7984 Long term (current) use of oral hypoglycemic drugs: Secondary | ICD-10-CM | POA: Diagnosis not present

## 2021-04-02 DIAGNOSIS — I4819 Other persistent atrial fibrillation: Secondary | ICD-10-CM | POA: Diagnosis not present

## 2021-04-02 DIAGNOSIS — G4730 Sleep apnea, unspecified: Secondary | ICD-10-CM | POA: Diagnosis not present

## 2021-04-02 DIAGNOSIS — Z87891 Personal history of nicotine dependence: Secondary | ICD-10-CM | POA: Diagnosis not present

## 2021-04-02 HISTORY — PX: CARDIOVERSION: SHX1299

## 2021-04-02 SURGERY — CARDIOVERSION
Anesthesia: General

## 2021-04-02 MED ORDER — PROPOFOL 10 MG/ML IV BOLUS
INTRAVENOUS | Status: DC | PRN
  Administered 2021-04-02: 100 mg via INTRAVENOUS

## 2021-04-02 MED ORDER — LACTATED RINGERS IV SOLN: INTRAVENOUS | Status: DC

## 2021-04-02 MED ORDER — ORAL CARE MOUTH RINSE: 15.0000 mL | Freq: Once | OROMUCOSAL | Status: DC

## 2021-04-02 MED ORDER — SODIUM CHLORIDE 0.9 % IV SOLN: INTRAVENOUS | Status: DC

## 2021-04-02 MED ORDER — CHLORHEXIDINE GLUCONATE 0.12 % MT SOLN: 15.0000 mL | Freq: Once | OROMUCOSAL | Status: DC

## 2021-04-02 NOTE — Anesthesia Preprocedure Evaluation (Signed)
Anesthesia Evaluation  Patient identified by MRN, date of birth, ID band Patient awake    Reviewed: Allergy & Precautions, NPO status , Patient's Chart, lab work & pertinent test results, reviewed documented beta blocker date and time   Airway Mallampati: III  TM Distance: >3 FB     Dental  (+) Dental Advisory Given Crowns :   Pulmonary sleep apnea and Continuous Positive Airway Pressure Ventilation , former smoker,    Pulmonary exam normal breath sounds clear to auscultation       Cardiovascular Exercise Tolerance: Good + dysrhythmias Atrial Fibrillation  Rhythm:Irregular Rate:Abnormal     Neuro/Psych PSYCHIATRIC DISORDERS Depression negative neurological ROS     GI/Hepatic GERD  Medicated,(+)     substance abuse  alcohol use,   Endo/Other  diabetes, Well Controlled, Type 2, Oral Hypoglycemic Agents  Renal/GU negative Renal ROS   Prostate cancer    Musculoskeletal negative musculoskeletal ROS (+)   Abdominal   Peds negative pediatric ROS (+)  Hematology  (+) Blood dyscrasia (polycythemia vera), ,   Anesthesia Other Findings   Reproductive/Obstetrics negative OB ROS                            Anesthesia Physical Anesthesia Plan  ASA: 3  Anesthesia Plan: General   Post-op Pain Management: Minimal or no pain anticipated   Induction: Intravenous  PONV Risk Score and Plan:   Airway Management Planned: Nasal Cannula, Natural Airway and Simple Face Mask  Additional Equipment:   Intra-op Plan:   Post-operative Plan:   Informed Consent: I have reviewed the patients History and Physical, chart, labs and discussed the procedure including the risks, benefits and alternatives for the proposed anesthesia with the patient or authorized representative who has indicated his/her understanding and acceptance.     Dental advisory given  Plan Discussed with: CRNA and  Surgeon  Anesthesia Plan Comments:         Anesthesia Quick Evaluation

## 2021-04-02 NOTE — CV Procedure (Signed)
Elective direct-current cardioversion  Indication: Persistent atrial fibrillation  Description of procedure: After informed consent was obtained the patient was taken to the PACU where a timeout was performed.  Chest pads were placed anteriorly and posteriorly in standard fashion and connected to a biphasic defibrillator.  Deep sedation was achieved via use of propofol with monitoring and dose administration as recorded in the anesthesia records.  With sandbag on the anterior chest pad, a single, synchronized 150 J shock was delivered with successful restoration of sinus rhythm, confirmed by postprocedure ECG.  Patient remained hemodynamically stable throughout and there were no immediate complications.  Disposition: After observation patient will be discharged home in the care of his significant other.  He will continue his present cardiac medications with office follow-up arranged.  Satira Sark, M.D., F.A.C.C.

## 2021-04-02 NOTE — H&P (Signed)
Cardiology Office Note   Date: 02/26/2021    ID: Brandon Maddox, DOB Sep 03, 1950, MRN [id number]   PCP:  [name], MD            Cardiologist:  Rozann Lesches, MD Electrophysiologist:  None       Chief Complaint  Patient presents with   Cardiac follow-up        History of Present Illness: Brandon Maddox is a 70 y.o. male last seen in March by Mr. Leonides Sake NP.  He is here for a follow-up visit.  He complains of worsening dyspnea on exertion, present for several months.  He has not had any chest tightness with exertion, no definite sense of palpitations.   He has atrial fibrillation managed as permanent atrial fibrillation with heart rate control strategy and CHA2DS2-VASc score of 1 based on current assessment.   I reviewed his medications which are listed below.  Heart rate is well controlled on current dose of Toprol-XL.  To this point he has been on aspirin 81 mg daily.   He has not undergone any recent cardiac structural or ischemic testing.  Echocardiogram done back in 2017 revealed LVEF 55 to 60%.   He is enjoying retirement.  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       Past Medical History:  Diagnosis Date   Acid reflux     Atrial fibrillation (HCC)     Closed right scapular fracture     Depression     GERD (gastroesophageal reflux disease)     Gout     Low testosterone     OSA on CPAP     Polycythemia vera ([place]) 01/17/2017   Prostate cancer ([place])     Ribs, multiple fractures     Wears glasses             Past Surgical History:  Procedure Laterality Date   COLONOSCOPY WITH PROPOFOL N/A 06/01/2018    Procedure: COLONOSCOPY WITH PROPOFOL;  Surgeon: Daneil Dolin, MD;  [facility];  Service: Endoscopy;  Laterality: N/A;  10:45am   CYSTOSCOPY N/A 06/26/2020    Procedure: CYSTOSCOPY;  Surgeon: Cleon Gustin, MD;  Location: [facility];  Service: Urology;  Laterality: N/A;   HIP  SURGERY Right 2010    Dr. 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  apixaban (ELIQUIS) 5 MG TABS tablet Take 1 tablet (5 mg total) by mouth 2 (two) times daily. 60 tablet 6   buPROPion (WELLBUTRIN XL) 150 MG 24 hr tablet Take 450 mg by mouth daily.    4   HYDROcodone-acetaminophen (NORCO) 5-325 MG tablet Take 1 tablet by mouth every 6 (six) hours as needed for moderate pain. 30 tablet 0   metoprolol succinate (TOPROL XL) 25 MG 24 hr tablet Take 1 tablet (25 mg total) by mouth daily. 90 tablet 3   omeprazole (PRILOSEC OTC) 20 MG tablet Take 20 mg by mouth daily.        OVER THE COUNTER MEDICATION daily. Vitamin b 12 1000 mg daily       OVER THE COUNTER MEDICATION Vitamin d 1000 mg daily       Tamsulosin HCl (FLOMAX) 0.4 MG CAPS Take 0.4 mg by mouth daily.         No current facility-administered medications for this visit.    Allergies:  Penicillins    Social History: The patient  reports that he quit smoking about 17 years ago. His  smoking use included cigarettes. He has a 10.00 pack-year smoking history. He has never used smokeless tobacco. He reports that he does not drink alcohol and does not use drugs.    Family History: The patient's family history includes Breast cancer in his mother; Diabetes in his father and mother.    ROS: No orthopnea or PND.   Physical Exam: VS:  BP 122/80 (BP Location: Left Arm, Patient Position: Sitting, Cuff Size: Normal)    Pulse 74    Ht 5\' 10"  (1.778 m)    Wt 220 lb (99.8 kg)    SpO2 98%    BMI 31.57 kg/m , BMI Body mass index is 31.57 kg/m.      Wt Readings from Last 3 Encounters:  02/26/21 220 lb (99.8 kg)  02/12/21 221 lb 9 oz (100.5 kg)  08/12/20 227 lb 9.6 oz (103.2 kg)    General: Patient appears comfortable at rest. HEENT: Conjunctiva and lids normal, wearing a mask. Lungs: Clear to auscultation, nonlabored breathing at rest. Cardiac: Irregularly irregular, no S3, 1/6 systolic murmur, no pericardial rub. Abdomen: Soft, nontender, bowel sounds present. Extremities: No pitting edema, distal pulses 2+. Skin: Warm and dry. Musculoskeletal: No kyphosis. Neuropsychiatric: Alert and oriented x3, affect grossly appropriate.   ECG:  An ECG dated 05/22/2020 was personally reviewed today and demonstrated:  Atrial fibrillation with nonspecific T wave changes.   Recent Labwork: 08/05/2020: ALT 16; AST 16; BUN 22; Creatinine, Ser 1.40; Potassium 4.1; Sodium 136 02/05/2021: Hemoglobin 14.8; Platelets 229  August 2022: Cholesterol 147, triglycerides 163, HDL 36, LDL 83, hemoglobin A1c 5.9%   Other Studies Reviewed Today:   Echocardiogram 12/16/2015: - Left ventricle: The cavity size was normal. Wall thickness was    increased in a pattern of mild LVH. Systolic function was normal.    The estimated ejection fraction was in the range of 55% to 60%.    Wall motion was normal; there were no regional wall motion    abnormalities. The study is not technically sufficient to allow     evaluation of LV diastolic function.    Assessment and Plan:   1.  Progressive dyspnea on exertion in a 70 year old male with atrial fibrillation, remote tobacco use, OSA on CPAP, PCV, and prostate cancer.  Not entirely clear that atrial fibrillation is the cause of his symptoms, but this is a possibility to  consider.  No recent cardiac structural or ischemic work-up however.  For now our plan is to take him off aspirin and initiate Eliquis 5 mg twice daily with an eye toward elective cardioversion attempt in 3 to 4 weeks.  Prior to that however we will obtain an echocardiogram and a Lexiscan Myoview as well.  Continue current dose of Toprol-XL.   2.  Atrial fibrillation, managed so far as permanent atrial fibrillation with rate control strategy.  CHA2DS2-VASc score 1 based on current work-up.  He has been on low-dose aspirin along with Toprol-XL.   Medication Adjustments/Labs and Tests Ordered: Current medicines are reviewed at length with the patient today.  Concerns regarding medicines are outlined above.    Tests Ordered:    Orders Placed This Encounter  Procedures   NM Myocar Multi W/Spect W/Wall Motion / EF   ECHOCARDIOGRAM COMPLETE        Medication Changes:     Meds ordered this encounter  Medications   apixaban (ELIQUIS) 5 MG TABS tablet      Sig: Take 1 tablet (5 mg total) by mouth 2 (two) times daily.      Dispense:  60 tablet      Refill:  6      02/26/2021 NEW        Disposition:  Follow up  test results.   Signed, Satira Sark, MD, [facility] 02/26/2021 2:01 PM    [facility] at [place], [place], [place] [address] Phone: [phone]; Fax: [phone]      Addendum 03/26/2021:   Follow-up echocardiogram in November revealed LVEF 55 to 60% with severely dilated left atrium.  Lexiscan Myoview was negative for ischemia.  He was started on flecainide 50 mg twice daily and continued on Eliquis with plan to schedule elective  cardioversion.  Procedure has been scheduled for December 15.   Satira Sark, M.D., F.A.C.C.   Addendum 04/02/2021:  Patient presents for cardioversion attempt today.  Lab work reviewed, potassium 4.7, BUN 20, creatinine 1.43. Has continued on Eliquis since initiation in November discussed above.  Satira Sark, M.D., F.A.C.C.

## 2021-04-02 NOTE — Progress Notes (Signed)
Electrical Cardioversion Procedure Note Brandon Maddox [id number] 04-26-1950  Procedure: Electrical Cardioversion Indications:  Atrial Fibrillation  Procedure Details Consent: Risks of procedure as well as the alternatives and risks of each were explained to the (patient/caregiver).  Consent for procedure obtained. Time Out: Verified patient identification, verified procedure, site/side was marked, verified correct patient position, special equipment/implants available, medications/allergies/relevent history reviewed, required imaging and test results available.  Performed  Patient placed on cardiac monitor, pulse oximetry, supplemental oxygen as necessary.  Sedation given:  propofol Pacer pads placed anterior and posterior chest.  Cardioverted 1 time(s).  Cardioverted at 150J.  Evaluation Findings: Post procedure EKG shows: NSR Complications: None Patient did tolerate procedure well.   Brandon Maddox 04/02/2021, 9:17 AM

## 2021-04-02 NOTE — Anesthesia Postprocedure Evaluation (Signed)
Anesthesia Post Note  Patient: Brandon Maddox  Procedure(s) Performed: CARDIOVERSION  Patient location during evaluation: Phase II Anesthesia Type: General Level of consciousness: awake and alert and oriented Pain management: pain level controlled Vital Signs Assessment: post-procedure vital signs reviewed and stable Respiratory status: spontaneous breathing, nonlabored ventilation and respiratory function stable Cardiovascular status: blood pressure returned to baseline and stable Postop Assessment: no apparent nausea or vomiting Anesthetic complications: no   No notable events documented.   Last Vitals:  Vitals:   04/02/21 1000 04/02/21 1014  BP: 122/82 129/85  Pulse: 74 75  Resp: 16 16  Temp:  36.9 C  SpO2: 100% 100%    Last Pain:  Vitals:   04/02/21 1014  TempSrc: Oral  PainSc: 0-No pain                 Marien Manship C Falicia Lizotte

## 2021-04-02 NOTE — Transfer of Care (Signed)
Immediate Anesthesia Transfer of Care Note  Patient: Brandon Maddox  Procedure(s) Performed: CARDIOVERSION  Patient Location: PACU  Anesthesia Type:General  Level of Consciousness: drowsy  Airway & Oxygen Therapy: Patient Spontanous Breathing and Patient connected to nasal cannula oxygen  Post-op Assessment: Report given to RN and Post -op Vital signs reviewed and stable  Post vital signs: Reviewed and stable  Last Vitals:  Vitals Value Taken Time  BP 99/71   Temp 98.7   Pulse 72   Resp 14   SpO2 99%     Last Pain:  Vitals:   04/02/21 0834  TempSrc: Oral      Patients Stated Pain Goal: 7 (67/73/73 6681)  Complications: No notable events documented.

## 2021-04-03 ENCOUNTER — Encounter (HOSPITAL_COMMUNITY): Payer: Self-pay | Admitting: Cardiology

## 2021-04-22 NOTE — Progress Notes (Signed)
Cardiology Office Note  Date: 04/23/2021   ID: Brandon Maddox, DOB April 21, 1950, MRN [id number]  PCP:  [name], MD  Cardiologist:  Rozann Lesches, MD Electrophysiologist:  None   Chief Complaint  Patient presents with   Cardiac follow-up    History of Present Illness: Brandon Maddox is a 71 y.o. male last seen in November 2022.  He presents for a routine follow-up visit. He underwent successful cardioversion in December with restoration of sinus rhythm at that time.  Since then he does report better stamina, not short of breath walking up a flight of steps as he had been before.  Still out of shape by his own admission, he plans to start walking for exercise.  I personally reviewed his follow-up ECG today which shows normal sinus rhythm with low voltage.  We went over his medications which are listed below and stable from a cardiac perspective.  He does not report any spontaneous bleeding problems on Eliquis.  Past Medical History:  Diagnosis Date   Acid reflux    Atrial fibrillation (HCC)    Closed right scapular fracture    Depression    GERD (gastroesophageal reflux disease)    Gout    Low testosterone    OSA on CPAP    Polycythemia vera ([place]) 01/17/2017   Prostate cancer ([place])    Ribs, multiple fractures    Wears glasses     Past Surgical History:  Procedure Laterality Date   CARDIOVERSION N/A 04/02/2021   Procedure: CARDIOVERSION;  Surgeon: Satira Sark, MD;  [facility];  Service: Cardiovascular;  Laterality: N/A;   COLONOSCOPY WITH PROPOFOL N/A 06/01/2018   Procedure: COLONOSCOPY WITH PROPOFOL;  Surgeon: Daneil Dolin, MD;  [facility];  Service: Endoscopy;  Laterality: N/A;  10:45am   CYSTOSCOPY N/A 06/26/2020   Procedure: CYSTOSCOPY;  Surgeon: Cleon Gustin, MD;  [facility];  Service: Urology;  Laterality: N/A;   HIP SURGERY Right 2010   Dr. Dennis Bast and then replacement of right hip   LACERATION REPAIR   05/03/2012   Procedure: REPAIR MULTIPLE LACERATIONS;  Surgeon: Jerrell Belfast, MD;  Location: [facility];  Service: ENT;  Laterality: N/A;   PENILE PROSTHESIS IMPLANT     PENILE PROSTHESIS IMPLANT  2014   POLYPECTOMY  06/01/2018   Procedure: POLYPECTOMY;  Surgeon: Daneil Dolin, MD;  [facility];  Service: Endoscopy;;  cecum,hepatic flexure   Pyloric stenosis     16 weeks old   RADIOACTIVE SEED IMPLANT N/A 06/26/2020   Procedure: RADIOACTIVE SEED IMPLANT/BRACHYTHERAPY IMPLANT;  Surgeon: Cleon Gustin, MD;  Location: [facility];  Service: Urology;  Laterality: N/A;   SPACE OAR INSTILLATION N/A 06/26/2020   Procedure: SPACE OAR INSTILLATION;  Surgeon: Cleon Gustin, MD;  Location: [facility];  Service: Urology;  Laterality: N/A;    Current Outpatient Medications  Medication Sig Dispense Refill   allopurinol (ZYLOPRIM) 300 MG tablet Take 300 mg by mouth daily.      apixaban (ELIQUIS) 5 MG TABS tablet Take 1 tablet (5 mg total) by mouth 2 (two) times daily. 60 tablet 6   buPROPion (WELLBUTRIN XL) 150 MG 24 hr tablet Take 450 mg by mouth daily.   4   cholecalciferol (VITAMIN D3) 25 MCG (1000 UNIT) tablet Take 1,000 Units by mouth daily.     flecainide (TAMBOCOR) 50 MG tablet Take 1 tablet (50 mg total) by mouth 2 (two) times  daily. 180 tablet 1   metoprolol succinate (TOPROL XL) 25 MG 24 hr tablet Take 1 tablet (25 mg total) by mouth daily. 90 tablet 3   omeprazole (PRILOSEC OTC) 20 MG tablet Take 20 mg by mouth daily.      Tamsulosin HCl (FLOMAX) 0.4 MG CAPS Take 0.4 mg by mouth in the morning and at bedtime.     vitamin B-12 (CYANOCOBALAMIN) 1000 MCG tablet Take 1,000 mcg by mouth every other day.     No current facility-administered medications for this visit.   Allergies:  Penicillins   ROS: No palpitations, no syncope.  Physical Exam: VS:  BP 104/72    Pulse 61    Ht 5\' 11"  (1.803 m)    Wt 222 lb (100.7 kg)    SpO2 99%    BMI 30.96  kg/m , BMI Body mass index is 30.96 kg/m.  Wt Readings from Last 3 Encounters:  04/23/21 222 lb (100.7 kg)  03/31/21 222 lb (100.7 kg)  02/26/21 220 lb (99.8 kg)    General: Patient appears comfortable at rest. HEENT: Conjunctiva and lids normal, mask. Neck: Supple, no elevated JVP or carotid bruits, no thyromegaly. Lungs: Clear to auscultation, nonlabored breathing at rest. Cardiac: Regular rate and rhythm, no S3, 1/6 systolic murmur. Extremities: No pitting edema.  ECG:  An ECG dated 04/02/2021 was personally reviewed today and demonstrated:  Atrial fibrillation.  Recent Labwork: 08/05/2020: ALT 16; AST 16 02/05/2021: Hemoglobin 14.8; Platelets 229 03/31/2021: BUN 22; Creatinine, Ser 1.43; Potassium 4.7; Sodium 134   Other Studies Reviewed Today:  Echocardiogram 02/27/2021:  1. Left ventricular ejection fraction, by estimation, is 55 to 60%. The  left ventricle has normal function. The left ventricle has no regional  wall motion abnormalities. Left ventricular diastolic parameters are  indeterminate.   2. Right ventricular systolic function is normal. The right ventricular  size is normal. There is normal pulmonary artery systolic pressure.   3. Left atrial size was severely dilated.   4. The mitral valve is abnormal. Mild mitral valve regurgitation. No  evidence of mitral stenosis.   5. The tricuspid valve is abnormal.   6. The aortic valve is tricuspid. Aortic valve regurgitation is not  visualized. No aortic stenosis is present.   7. The inferior vena cava is dilated in size with >50% respiratory  variability, suggesting right atrial pressure of 8 mmHg.   Lexiscan Myoview 03/06/2021:   Findings are consistent with no ischemia. The study is low risk.   No ST deviation was noted. The ECG was negative for ischemia.   LV perfusion is normal.   Left ventricular function is normal. Nuclear stress EF: 77 %.   Low risk test with no ischemia and vigorous LVEF at  77%.  Assessment and Plan:  1.  Persistent atrial fibrillation with CHA2DS2-VASc score of 1.  He is status post successful cardioversion in December 2022 and is maintaining sinus rhythm at this time.  Reports improvement in shortness of breath with activity.  LVEF normal at 55 to 60% with severely dilated left atrium.  No ischemic defects by Myoview.  Plan to continue medical therapy and observation for now.  No change in current regimen including Eliquis, flecainide, and Toprol-XL.  He will have interval lab work with PCP prior to next visit.  2.  OSA on CPAP.  Medication Adjustments/Labs and Tests Ordered: Current medicines are reviewed at length with the patient today.  Concerns regarding medicines are outlined above.   Tests Ordered: Orders  Placed This Encounter  Procedures   EKG 12-Lead    Medication Changes: No orders of the defined types were placed in this encounter.   Disposition:  Follow up  6 months.  Signed, Satira Sark, MD, [facility] 04/23/2021 8:44 AM    [facility] at [place], [place], [place] [address] Phone: [phone]; Fax: [phone]

## 2021-04-23 ENCOUNTER — Encounter: Payer: Self-pay | Admitting: Cardiology

## 2021-04-23 ENCOUNTER — Ambulatory Visit: Payer: PPO | Admitting: Cardiology

## 2021-04-23 VITALS — BP 104/72 | HR 61 | Ht 71.0 in | Wt 222.0 lb

## 2021-04-23 DIAGNOSIS — G4733 Obstructive sleep apnea (adult) (pediatric): Secondary | ICD-10-CM | POA: Diagnosis not present

## 2021-04-23 DIAGNOSIS — Z9989 Dependence on other enabling machines and devices: Secondary | ICD-10-CM

## 2021-04-23 DIAGNOSIS — I4819 Other persistent atrial fibrillation: Secondary | ICD-10-CM

## 2021-04-23 NOTE — Patient Instructions (Addendum)

## 2021-06-03 ENCOUNTER — Encounter: Payer: Self-pay | Admitting: *Deleted

## 2021-06-10 DIAGNOSIS — C61 Malignant neoplasm of prostate: Secondary | ICD-10-CM | POA: Diagnosis not present

## 2021-06-10 DIAGNOSIS — F3340 Major depressive disorder, recurrent, in remission, unspecified: Secondary | ICD-10-CM | POA: Diagnosis not present

## 2021-06-10 DIAGNOSIS — I480 Paroxysmal atrial fibrillation: Secondary | ICD-10-CM | POA: Diagnosis not present

## 2021-06-10 DIAGNOSIS — K4090 Unilateral inguinal hernia, without obstruction or gangrene, not specified as recurrent: Secondary | ICD-10-CM | POA: Diagnosis not present

## 2021-06-17 ENCOUNTER — Encounter: Payer: Self-pay | Admitting: Internal Medicine

## 2021-07-21 ENCOUNTER — Encounter: Payer: Self-pay | Admitting: Internal Medicine

## 2021-07-29 DIAGNOSIS — S42294A Other nondisplaced fracture of upper end of right humerus, initial encounter for closed fracture: Secondary | ICD-10-CM | POA: Diagnosis not present

## 2021-08-06 DIAGNOSIS — S42294A Other nondisplaced fracture of upper end of right humerus, initial encounter for closed fracture: Secondary | ICD-10-CM | POA: Diagnosis not present

## 2021-08-07 ENCOUNTER — Inpatient Hospital Stay (HOSPITAL_COMMUNITY): Payer: PPO | Attending: Hematology

## 2021-08-07 DIAGNOSIS — D45 Polycythemia vera: Secondary | ICD-10-CM | POA: Insufficient documentation

## 2021-08-07 LAB — CBC WITH DIFFERENTIAL/PLATELET
Abs Immature Granulocytes: 0.03 K/uL (ref 0.00–0.07)
Basophils Absolute: 0 K/uL (ref 0.0–0.1)
Basophils Relative: 1 %
Eosinophils Absolute: 0.1 K/uL (ref 0.0–0.5)
Eosinophils Relative: 1 %
HCT: 42.8 % (ref 39.0–52.0)
Hemoglobin: 13.1 g/dL (ref 13.0–17.0)
Immature Granulocytes: 1 %
Lymphocytes Relative: 22 %
Lymphs Abs: 1.2 K/uL (ref 0.7–4.0)
MCH: 24.8 pg — ABNORMAL LOW (ref 26.0–34.0)
MCHC: 30.6 g/dL (ref 30.0–36.0)
MCV: 81.1 fL (ref 80.0–100.0)
Monocytes Absolute: 0.5 K/uL (ref 0.1–1.0)
Monocytes Relative: 9 %
Neutro Abs: 3.7 K/uL (ref 1.7–7.7)
Neutrophils Relative %: 66 %
Platelets: 213 K/uL (ref 150–400)
RBC: 5.28 MIL/uL (ref 4.22–5.81)
RDW: 17.2 % — ABNORMAL HIGH (ref 11.5–15.5)
WBC: 5.5 K/uL (ref 4.0–10.5)
nRBC: 0 % (ref 0.0–0.2)

## 2021-08-13 ENCOUNTER — Inpatient Hospital Stay (HOSPITAL_COMMUNITY): Payer: PPO

## 2021-08-20 ENCOUNTER — Inpatient Hospital Stay (HOSPITAL_COMMUNITY): Payer: PPO | Attending: Hematology | Admitting: Hematology

## 2021-08-20 VITALS — BP 121/74 | HR 67 | Temp 97.6°F | Resp 18 | Ht 69.09 in | Wt 214.5 lb

## 2021-08-20 DIAGNOSIS — E538 Deficiency of other specified B group vitamins: Secondary | ICD-10-CM | POA: Insufficient documentation

## 2021-08-20 DIAGNOSIS — C61 Malignant neoplasm of prostate: Secondary | ICD-10-CM | POA: Diagnosis not present

## 2021-08-20 DIAGNOSIS — D45 Polycythemia vera: Secondary | ICD-10-CM | POA: Diagnosis not present

## 2021-08-20 DIAGNOSIS — E559 Vitamin D deficiency, unspecified: Secondary | ICD-10-CM | POA: Diagnosis not present

## 2021-08-20 DIAGNOSIS — Z803 Family history of malignant neoplasm of breast: Secondary | ICD-10-CM | POA: Diagnosis not present

## 2021-08-20 DIAGNOSIS — Z87891 Personal history of nicotine dependence: Secondary | ICD-10-CM | POA: Diagnosis not present

## 2021-08-20 NOTE — Patient Instructions (Addendum)
[place] at [facility] ?Discharge Instructions ? ?You were seen and examined today by Dr. Delton Coombes. ? ?Dr. Delton Coombes discussed your most recent lab work and everything looks good. Keep appointments with Dr. Willey Blade and if labs are off when he does them please call for sooner appointment. ? ?Follow-up as scheduled in 1 year. ? ? ? ?Thank you for choosing [place] at [facility] to provide your oncology and hematology care.  To afford each patient quality time with our provider, please arrive at least 15 minutes before your scheduled appointment time.  ? ?If you have a lab appointment with the [facility] please come in thru the Main Entrance and check in at the main information desk. ? ?You need to re-schedule your appointment should you arrive 10 or more minutes late.  We strive to give you quality time with our providers, and arriving late affects you and other patients whose appointments are after yours.  Also, if you no show three or more times for appointments you may be dismissed from the clinic at the providers discretion.     ?Again, thank you for choosing [facility].  Our hope is that these requests will decrease the amount of time that you wait before being seen by our physicians.       ?_____________________________________________________________ ? ?Should you have questions after your visit to [facility], please contact our office at [phone] and follow the prompts.  Our office hours are 8:00 a.m. and 4:30 p.m. Monday - Friday.  Please note that voicemails left after 4:00 p.m. may not be returned until the following business day.  We are closed weekends and major holidays.  You do have access to a nurse 24-7, just call the main number to the clinic [phone] and do not press any options, hold on the line and a nurse will answer the phone.   ? ?For prescription refill requests, have your pharmacy contact our office  and allow 72 hours.   ? ?Due to Covid, you will need to wear a mask upon entering the hospital. If you do not have a mask, a mask will be given to you at the Main Entrance upon arrival. For doctor visits, patients may have 1 support person age 54 or older with them. For treatment visits, patients can not have anyone with them due to social distancing guidelines and our immunocompromised population.  ? ?  ?

## 2021-08-20 NOTE — Progress Notes (Signed)
Brandon Maddox, [place] [address]   CLINIC:  Medical Oncology/Hematology  PCP:  [name], MD [address] / [place] [address]  [phone]  REASON FOR VISIT:  Follow-up for polycythemia vera  PRIOR THERAPY: none  CURRENT THERAPY: Intermittent phlebotomies last on 02/12/2020  INTERVAL HISTORY:  Brandon Maddox, a 71 y.o. male, returns for routine follow-up for his polycythemia vera. Brandon Maddox was last seen on 02/12/2021.  Today he reports feeling good. His energy levels have improved. He denies itching after showers, and fingers changing colors. He denies recent infections, hematuria, black stools, and hematochezia.   REVIEW OF SYSTEMS:  Review of Systems  Constitutional:  Negative for appetite change and fatigue.  Respiratory:  Positive for shortness of breath.   Gastrointestinal:  Negative for blood in stool.  Genitourinary:  Negative for hematuria.   Skin:  Negative for itching.  All other systems reviewed and are negative.  PAST MEDICAL/SURGICAL HISTORY:  Past Medical History:  Diagnosis Date   Acid reflux    Atrial fibrillation (HCC)    Closed right scapular fracture    Depression    GERD (gastroesophageal reflux disease)    Gout    Low testosterone    OSA on CPAP    Polycythemia vera ([place]) 01/17/2017   Prostate cancer ([place])    Ribs, multiple fractures    Wears glasses    Past Surgical History:  Procedure Laterality Date   CARDIOVERSION N/A 04/02/2021   Procedure: CARDIOVERSION;  Surgeon: Satira Sark, MD;  Location: [facility];  Service: Cardiovascular;  Laterality: N/A;   COLONOSCOPY WITH PROPOFOL N/A 06/01/2018   Procedure: COLONOSCOPY WITH PROPOFOL;  Surgeon: Daneil Dolin, MD;  Location: [facility];  Service: Endoscopy;  Laterality: N/A;  10:45am   CYSTOSCOPY N/A 06/26/2020   Procedure: CYSTOSCOPY;  Surgeon: Cleon Gustin, MD;  Location: [facility];  Service: Urology;   Laterality: N/A;   HIP SURGERY Right 2010   Dr. 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   RADIOACTIVE SEED IMPLANT N/A 06/26/2020   Procedure: RADIOACTIVE SEED IMPLANT/BRACHYTHERAPY IMPLANT;  Surgeon: Cleon Gustin, MD;  Location: [facility];  Service: Urology;  Laterality: N/A;   SPACE OAR INSTILLATION N/A 06/26/2020   Procedure: SPACE OAR INSTILLATION;  Surgeon: Cleon Gustin, MD;  Location: [facility];  Service: Urology;  Laterality: N/A;    SOCIAL HISTORY:  Social History   Socioeconomic History   Marital status: Divorced    Spouse name: Not on file   Number of children: 1   Years of education: Not on file   Highest education level: Not on file  Occupational History    Employer: SELF EMPLOYED    Comment: retired  Tobacco Use   Smoking status: Former    Packs/day: 1.00    Years: 10.00    Pack years: 10.00    Types: Cigarettes    Quit date: 04/20/2003    Years since quitting: 37.3  Smokeless tobacco: Never  Vaping Use   Vaping Use: Never used  Substance and Sexual Activity   Alcohol use: No    Alcohol/week: 0.0 standard drinks    Comment: History of alcohol abuse in the past; none in 7 years   Drug use: No   Sexual activity: Yes  Other Topics Concern   Not on file  Social History Narrative   Not on file   Social Determinants of Health   Financial Resource Strain: Not on file  Food Insecurity: Not on file  Transportation Needs: Not on file  Physical Activity: Not on file  Stress: Not on file  Social Connections:  Not on file  Intimate Partner Violence: Not on file    FAMILY HISTORY:  Family History  Problem Relation Age of Onset   Diabetes Mother    Breast cancer Mother    Diabetes Father    Colon cancer Neg Hx    Pancreatic cancer Neg Hx    Prostate cancer Neg Hx     CURRENT MEDICATIONS:  Current Outpatient Medications  Medication Sig Dispense Refill   allopurinol (ZYLOPRIM) 300 MG tablet Take 300 mg by mouth daily.      apixaban (ELIQUIS) 5 MG TABS tablet Take 1 tablet (5 mg total) by mouth 2 (two) times daily. 60 tablet 6   buPROPion (WELLBUTRIN XL) 150 MG 24 hr tablet Take 450 mg by mouth daily.   4   cholecalciferol (VITAMIN D3) 25 MCG (1000 UNIT) tablet Take 1,000 Units by mouth daily.     flecainide (TAMBOCOR) 50 MG tablet Take 1 tablet (50 mg total) by mouth 2 (two) times daily. 180 tablet 1   metoprolol succinate (TOPROL XL) 25 MG 24 hr tablet Take 1 tablet (25 mg total) by mouth daily. 90 tablet 3   omeprazole (PRILOSEC OTC) 20 MG tablet Take 20 mg by mouth daily.      Tamsulosin HCl (FLOMAX) 0.4 MG CAPS Take 0.4 mg by mouth in the morning and at bedtime.     vitamin B-12 (CYANOCOBALAMIN) 1000 MCG tablet Take 1,000 mcg by mouth every other day.     No current facility-administered medications for this visit.    ALLERGIES:  Allergies  Allergen Reactions   Penicillins Other (See Comments)    Mother told him throat swelled up when he took at a young age  Did it involve swelling of the face/tongue/throat, SOB, or low BP? Yes Did it involve sudden or severe rash/hives, skin peeling, or any reaction on the inside of your mouth or nose? Unknown Did you need to seek medical attention at a hospital or doctor's office? Yes When did it last happen?  Childhood reaction    If all above answers are "NO", may proceed with cephalosporin use.     PHYSICAL EXAM:  Performance status (ECOG): 1 - Symptomatic but completely ambulatory  There were no vitals filed for this visit. Wt Readings  from Last 3 Encounters:  04/23/21 222 lb (100.7 kg)  03/31/21 222 lb (100.7 kg)  02/26/21 220 lb (99.8 kg)   Physical Exam Vitals reviewed.  Constitutional:      Appearance: Normal appearance. He is obese.  Cardiovascular:     Rate and Rhythm: Normal rate and regular rhythm.     Pulses: Normal pulses.     Heart sounds: Normal heart sounds.  Pulmonary:     Effort: Pulmonary effort is normal.     Breath sounds: Normal breath sounds.  Neurological:  General: No focal deficit present.     Mental Status: He is alert and oriented to person, place, and time.  Psychiatric:        Mood and Affect: Mood normal.        Behavior: Behavior normal.    LABORATORY DATA:  I have reviewed the labs as listed.     Latest Ref Rng & Units 08/07/2021   10:07 AM 02/05/2021    1:15 PM 08/05/2020    1:22 PM  CBC  WBC 4.0 - 10.5 K/uL 5.5   5.5   7.3    Hemoglobin 13.0 - 17.0 g/dL 13.1   14.8   15.8    Hematocrit 39.0 - 52.0 % 42.8   47.8   50.0    Platelets 150 - 400 K/uL 213   229   235        Latest Ref Rng & Units 03/31/2021    3:20 PM 08/05/2020    1:22 PM 06/23/2020    9:42 AM  CMP  Glucose 70 - 99 mg/dL 125   113   87    BUN 8 - 23 mg/dL _0 Creatinine 0.61 - 1.24 mg/dL 1.43   1.40   1.08    Sodium 135 - 145 mmol/L 134   136   138    Potassium 3.5 - 5.1 mmol/L 4.7   4.1   4.4    Chloride 98 - 111 mmol/L 102   102   108    CO2 22 - 32 mmol/L _1 Calcium 8.9 - 10.3 mg/dL 9.0   9.0   9.3    Total Protein 6.5 - 8.1 g/dL  7.1   6.9    Total Bilirubin 0.3 - 1.2 mg/dL  0.4   0.9    Alkaline Phos 38 - 126 U/L  78   62    AST 15 - 41 U/L  16   17    ALT 0 - 44 U/L  16   16        Component Value Date/Time   RBC 5.28 08/07/2021 1007   MCV 81.1 08/07/2021 1007   MCH 24.8 (L) 08/07/2021 1007   MCHC 30.6 08/07/2021 1007   RDW 17.2 (H) 08/07/2021 1007   LYMPHSABS 1.2 08/07/2021 1007   MONOABS 0.5 08/07/2021 1007   EOSABS 0.1 08/07/2021 1007   BASOSABS 0.0  08/07/2021 1007    DIAGNOSTIC IMAGING:  I have independently reviewed the scans and discussed with the patient. No results found.   ASSESSMENT:  1.  Secondary polycythemia: -Jak 2 V6 5F and reflex testing was negative. - Initially thought to be secondary to testosterone supplements.  He has been off of testosterone since July 2018 however he continues to have secondary polycythemia. - Last phlebotomy was in December 2019. - He does not have any vasomotor symptoms.  No aquagenic pruritus.  No erythromelalgia's. - BCR/ABL by PCR showed trace positivity for B2 A2 transcript.  Will consider doing FISH testing at some point. -Bone marrow biopsy November 2018 showed slightly hypercellular bone marrow for age with trilineage hematopoiesis.  No definite morphological evidence of myeloproliferative disorder. -Last phlebotomy was in February 2021.   2.  T1c prostate cancer: - Gleason 3+3, PSA 5.1. - Status post seed implants on 06/26/2020.   PLAN:  1.  Secondary polycythemia: - Last phlebotomy was on 02/17/2021. - He reportedly had  cardioversion in December. - Denies any aquagenic pruritus. - Labs show hematocrit is 42.8. - RTC 1 year for follow-up.   2.  Vitamin D deficiency: - Continue vitamin D 1000 units daily.   3.  Vitamin B12 deficiency: - Continue B12 1 mg tablet daily.  Orders placed this encounter:  No orders of the defined types were placed in this encounter.    Derek Jack, MD [place] [phone]   I, Thana Ates, am acting as a scribe for Dr. Derek Jack.  I, Derek Jack MD, have reviewed the above documentation for accuracy and completeness, and I agree with the above.

## 2021-08-25 DIAGNOSIS — S42294A Other nondisplaced fracture of upper end of right humerus, initial encounter for closed fracture: Secondary | ICD-10-CM | POA: Diagnosis not present

## 2021-09-03 ENCOUNTER — Other Ambulatory Visit: Payer: Self-pay | Admitting: Cardiology

## 2021-09-15 DIAGNOSIS — S42294D Other nondisplaced fracture of upper end of right humerus, subsequent encounter for fracture with routine healing: Secondary | ICD-10-CM | POA: Diagnosis not present

## 2021-09-25 ENCOUNTER — Other Ambulatory Visit: Payer: Self-pay | Admitting: Cardiology

## 2021-09-25 DIAGNOSIS — I4819 Other persistent atrial fibrillation: Secondary | ICD-10-CM

## 2021-09-25 NOTE — Telephone Encounter (Signed)
Eliquis '5mg'$  refill request received. Patient is 71 years old, weight-97.3kg, Crea-1.43 on 03/31/21, Diagnosis-Afib, and last seen by Dr. Domenic Polite on 04/23/2021. Dose is appropriate based on dosing criteria. Will send in refill to requested pharmacy.

## 2021-09-28 ENCOUNTER — Ambulatory Visit: Payer: PPO | Admitting: Gastroenterology

## 2021-10-11 ENCOUNTER — Encounter: Payer: Self-pay | Admitting: Gastroenterology

## 2021-10-12 ENCOUNTER — Encounter: Payer: Self-pay | Admitting: Gastroenterology

## 2021-10-12 ENCOUNTER — Telehealth: Payer: Self-pay | Admitting: *Deleted

## 2021-10-12 ENCOUNTER — Ambulatory Visit (INDEPENDENT_AMBULATORY_CARE_PROVIDER_SITE_OTHER): Payer: PPO | Admitting: Gastroenterology

## 2021-10-12 VITALS — BP 131/84 | HR 69 | Temp 97.8°F | Ht 70.0 in | Wt 216.0 lb

## 2021-10-12 DIAGNOSIS — Z7901 Long term (current) use of anticoagulants: Secondary | ICD-10-CM | POA: Diagnosis not present

## 2021-10-12 DIAGNOSIS — Z86010 Personal history of colonic polyps: Secondary | ICD-10-CM | POA: Diagnosis not present

## 2021-10-12 NOTE — Telephone Encounter (Signed)
Patient with diagnosis of afib on Eliquis for anticoagulation.    Procedure: colonoscopy Date of procedure: TBD  CHA2DS2-VASc Score = 2  This indicates a 2.2% annual risk of stroke. The patient's score is based upon: CHF History: 0 HTN History: 0 Diabetes History: 1 Stroke History: 0 Vascular Disease History: 0 Age Score: 1 Gender Score: 0  CrCl 80mL/min Platelet count 213K  Per office protocol, patient can hold Eliquis for 2 days prior to procedure as requested.

## 2021-10-12 NOTE — Telephone Encounter (Signed)
Attention: Preop   We would like to request holding the following medication for patient please.  Procedure: Colonoscopy   Date: TBD  Medication to hold: Eliqus for 48 hours prior to procedure  Surgeon: Dr. Jena Gauss   Phone: [phone]  Fax:  [phone]  Type of Anesthesia:  Propofol   ASA III

## 2021-10-13 NOTE — Telephone Encounter (Signed)
Received medication clearance for pt. Copy placed on providers desk.  ?

## 2021-10-14 NOTE — Telephone Encounter (Signed)
Received approval from Nicholes Rough, PA-C that patient may hold Brandon Maddox x2 days prior to procedure.  Mindy: Please proceed with scheduling colonoscopy with Dr. [name] as planned.  He will need to hold Brandon Maddox for 2 days prior to his procedure.

## 2021-10-14 NOTE — Telephone Encounter (Signed)
Will call patient once we receive future schedule

## 2021-10-15 MED ORDER — PEG 3350-KCL-NA BICARB-NACL 420 G PO SOLR: ORAL | 0 refills | Status: DC

## 2021-10-15 NOTE — Addendum Note (Signed)
Addended by: Cheron Every on: 10/15/2021 03:39 PM   Modules accepted: Orders

## 2021-10-15 NOTE — Telephone Encounter (Signed)
Spoke with pt. Scheduled for 8/9 at 9:15am. Awre to hold eliquis 48 hrs prior. Instructions/pre-op mailed. Rx sent to pharmacy

## 2021-10-16 ENCOUNTER — Encounter: Payer: Self-pay | Admitting: *Deleted

## 2021-11-23 ENCOUNTER — Encounter (HOSPITAL_COMMUNITY)
Admission: RE | Admit: 2021-11-23 | Discharge: 2021-11-23 | Disposition: A | Discharge status: Home / Self Care | Payer: PPO | Source: Ambulatory Visit | Attending: Internal Medicine | Admitting: Internal Medicine

## 2021-11-25 ENCOUNTER — Ambulatory Visit (HOSPITAL_COMMUNITY)
Admission: RE | Admit: 2021-11-25 | Discharge: 2021-11-25 | Disposition: A | Discharge status: Home / Self Care | Payer: PPO | Attending: Internal Medicine | Admitting: Internal Medicine

## 2021-11-25 ENCOUNTER — Encounter (HOSPITAL_COMMUNITY): Payer: Self-pay | Admitting: Internal Medicine

## 2021-11-25 ENCOUNTER — Ambulatory Visit (HOSPITAL_COMMUNITY): Payer: PPO | Admitting: Anesthesiology

## 2021-11-25 ENCOUNTER — Encounter (HOSPITAL_COMMUNITY)
Admission: RE | Disposition: A | Discharge status: Home / Self Care | Payer: Self-pay | Source: Home / Self Care | Attending: Internal Medicine

## 2021-11-25 ENCOUNTER — Ambulatory Visit (HOSPITAL_BASED_OUTPATIENT_CLINIC_OR_DEPARTMENT_OTHER): Payer: PPO | Admitting: Anesthesiology

## 2021-11-25 DIAGNOSIS — K635 Polyp of colon: Secondary | ICD-10-CM

## 2021-11-25 DIAGNOSIS — Z09 Encounter for follow-up examination after completed treatment for conditions other than malignant neoplasm: Secondary | ICD-10-CM | POA: Diagnosis not present

## 2021-11-25 DIAGNOSIS — Z86010 Personal history of colonic polyps: Secondary | ICD-10-CM

## 2021-11-25 DIAGNOSIS — E119 Type 2 diabetes mellitus without complications: Secondary | ICD-10-CM | POA: Diagnosis not present

## 2021-11-25 DIAGNOSIS — Z7984 Long term (current) use of oral hypoglycemic drugs: Secondary | ICD-10-CM | POA: Diagnosis not present

## 2021-11-25 DIAGNOSIS — Z1211 Encounter for screening for malignant neoplasm of colon: Secondary | ICD-10-CM | POA: Insufficient documentation

## 2021-11-25 DIAGNOSIS — K5730 Diverticulosis of large intestine without perforation or abscess without bleeding: Secondary | ICD-10-CM | POA: Diagnosis not present

## 2021-11-25 DIAGNOSIS — Z8546 Personal history of malignant neoplasm of prostate: Secondary | ICD-10-CM | POA: Diagnosis not present

## 2021-11-25 DIAGNOSIS — D122 Benign neoplasm of ascending colon: Secondary | ICD-10-CM | POA: Diagnosis not present

## 2021-11-25 DIAGNOSIS — K219 Gastro-esophageal reflux disease without esophagitis: Secondary | ICD-10-CM | POA: Insufficient documentation

## 2021-11-25 DIAGNOSIS — G4730 Sleep apnea, unspecified: Secondary | ICD-10-CM | POA: Insufficient documentation

## 2021-11-25 DIAGNOSIS — Z87891 Personal history of nicotine dependence: Secondary | ICD-10-CM | POA: Diagnosis not present

## 2021-11-25 DIAGNOSIS — I4891 Unspecified atrial fibrillation: Secondary | ICD-10-CM | POA: Diagnosis not present

## 2021-11-25 HISTORY — PX: HEMOSTASIS CLIP PLACEMENT: SHX6857

## 2021-11-25 HISTORY — PX: COLONOSCOPY WITH PROPOFOL: SHX5780

## 2021-11-25 HISTORY — PX: POLYPECTOMY: SHX5525

## 2021-11-25 SURGERY — COLONOSCOPY WITH PROPOFOL
Anesthesia: General

## 2021-11-25 MED ORDER — LACTATED RINGERS IV SOLN
INTRAVENOUS | Status: DC
  Administered 2021-11-25: 1000 mL via INTRAVENOUS

## 2021-11-25 MED ORDER — PROPOFOL 10 MG/ML IV BOLUS
INTRAVENOUS | Status: DC | PRN
  Administered 2021-11-25: 60 mg via INTRAVENOUS

## 2021-11-25 MED ORDER — PHENYLEPHRINE HCL (PRESSORS) 10 MG/ML IV SOLN
INTRAVENOUS | Status: DC | PRN
  Administered 2021-11-25: 75 ug via INTRAVENOUS
  Administered 2021-11-25: 25 ug via INTRAVENOUS
  Administered 2021-11-25: 50 ug via INTRAVENOUS

## 2021-11-25 MED ORDER — PROPOFOL 500 MG/50ML IV EMUL
INTRAVENOUS | Status: DC | PRN
  Administered 2021-11-25: 150 ug/kg/min via INTRAVENOUS

## 2021-11-25 NOTE — Discharge Instructions (Signed)
  Colonoscopy Discharge Instructions  Read the instructions outlined below and refer to this sheet in the next few weeks. These discharge instructions provide you with general information on caring for yourself after you leave the hospital. Your doctor may also give you specific instructions. While your treatment has been planned according to the most current medical practices available, unavoidable complications occasionally occur. If you have any problems or questions after discharge, call Dr. [name] at [phone]. ACTIVITY You may resume your regular activity, but move at a slower pace for the next 24 hours.  Take frequent rest periods for the next 24 hours.  Walking will help get rid of the air and reduce the bloated feeling in your belly (abdomen).  No driving for 24 hours (because of the medicine (anesthesia) used during the test).   Do not sign any important legal documents or operate any machinery for 24 hours (because of the anesthesia used during the test).  NUTRITION Drink plenty of fluids.  You may resume your normal diet as instructed by your doctor.  Begin with a light meal and progress to your normal diet. Heavy or fried foods are harder to digest and may make you feel sick to your stomach (nauseated).  Avoid alcoholic beverages for 24 hours or as instructed.  MEDICATIONS You may resume your normal medications unless your doctor tells you otherwise.  WHAT YOU CAN EXPECT TODAY Some feelings of bloating in the abdomen.  Passage of more gas than usual.  Spotting of blood in your stool or on the toilet paper.  IF YOU HAD POLYPS REMOVED DURING THE COLONOSCOPY: No aspirin products for 7 days or as instructed.  No alcohol for 7 days or as instructed.  Eat a soft diet for the next 24 hours.  FINDING OUT THE RESULTS OF YOUR TEST Not all test results are available during your visit. If your test results are not back during the visit, make an appointment with your caregiver to find out the  results. Do not assume everything is normal if you have not heard from your caregiver or the medical facility. It is important for you to follow up on all of your test results.  SEEK IMMEDIATE MEDICAL ATTENTION IF: You have more than a spotting of blood in your stool.  Your belly is swollen (abdominal distention).  You are nauseated or vomiting.  You have a temperature over 101.  You have abdominal pain or discomfort that is severe or gets worse throughout the day.    2 polyps removed from your colon today  Further recommendations to follow pending review of pathology report colon polyp and diverticulosis information provided  Resume Eliquis today  No future MRI until clips gone  At patient request, I called Hassan Rowan at [phone] -reviewed findings and recommendations

## 2021-11-25 NOTE — Anesthesia Postprocedure Evaluation (Signed)
Anesthesia Post Note  Patient: Brandon Maddox  Procedure(s) Performed: COLONOSCOPY WITH PROPOFOL POLYPECTOMY HEMOSTASIS CLIP PLACEMENT  Patient location during evaluation: Phase II Anesthesia Type: General Level of consciousness: awake and alert and oriented Pain management: pain level controlled Vital Signs Assessment: post-procedure vital signs reviewed and stable Respiratory status: spontaneous breathing, nonlabored ventilation and respiratory function stable Cardiovascular status: blood pressure returned to baseline and stable Postop Assessment: no apparent nausea or vomiting Anesthetic complications: no   No notable events documented.   Last Vitals:  Vitals:   11/25/21 0928 11/25/21 0941  BP: (!) 84/48 (!) 89/53  Pulse:    Resp:    Temp:    SpO2: 98% 95%    Last Pain:  Vitals:   11/25/21 0941  TempSrc:   PainSc: 0-No pain                 Jw Covin C Branon Sabine

## 2021-11-25 NOTE — Anesthesia Preprocedure Evaluation (Addendum)
Anesthesia Evaluation  Patient identified by MRN, date of birth, ID band Patient awake    Reviewed: Allergy & Precautions, NPO status , Patient's Chart, lab work & pertinent test results, reviewed documented beta blocker date and time   Airway Mallampati: III  TM Distance: >3 FB Neck ROM: Full    Dental  (+) Dental Advisory Given Crowns :   Pulmonary sleep apnea and Continuous Positive Airway Pressure Ventilation , former smoker,    Pulmonary exam normal breath sounds clear to auscultation       Cardiovascular (-) hypertensionPt. on home beta blockers Normal cardiovascular exam+ dysrhythmias Atrial Fibrillation  Rhythm:Regular Rate:Normal     Neuro/Psych PSYCHIATRIC DISORDERS Depression negative neurological ROS     GI/Hepatic GERD  Medicated,(+)     substance abuse  alcohol use,   Endo/Other  diabetes, Well Controlled, Type 2, Oral Hypoglycemic Agents  Renal/GU negative Renal ROS Bladder dysfunction (prostate cancer, BPH)      Musculoskeletal negative musculoskeletal ROS (+)   Abdominal   Peds negative pediatric ROS (+)  Hematology  (+) Blood dyscrasia (polycythemia vera), ,   Anesthesia Other Findings Multiple rib fx  Reproductive/Obstetrics negative OB ROS                           Anesthesia Physical Anesthesia Plan  ASA: 3  Anesthesia Plan: General   Post-op Pain Management: Minimal or no pain anticipated   Induction: Intravenous  PONV Risk Score and Plan: Propofol infusion  Airway Management Planned: Nasal Cannula and Natural Airway  Additional Equipment:   Intra-op Plan:   Post-operative Plan:   Informed Consent: I have reviewed the patients History and Physical, chart, labs and discussed the procedure including the risks, benefits and alternatives for the proposed anesthesia with the patient or authorized representative who has indicated his/her understanding and  acceptance.     Dental advisory given  Plan Discussed with: CRNA and Surgeon  Anesthesia Plan Comments:         Anesthesia Quick Evaluation

## 2021-11-25 NOTE — Transfer of Care (Signed)
Immediate Anesthesia Transfer of Care Note  Patient: Brandon Maddox  Procedure(s) Performed: COLONOSCOPY WITH PROPOFOL POLYPECTOMY HEMOSTASIS CLIP PLACEMENT  Patient Location: PACU  Anesthesia Type:General  Level of Consciousness: awake, alert  and oriented  Airway & Oxygen Therapy: Patient Spontanous Breathing and Patient connected to nasal cannula oxygen  Post-op Assessment: Report given to RN, Patient moving all extremities X 4 and Patient able to stick tongue midline  Post vital signs: Reviewed  Last Vitals:  Vitals Value Taken Time  BP 75/34 11/25/21 0926  Temp 36.6 C 11/25/21 0926  Pulse 58 11/25/21 0926  Resp 14 11/25/21 0926  SpO2 94 % 11/25/21 0926    Last Pain:  Vitals:   11/25/21 0926  TempSrc: Oral  PainSc: Asleep      Patients Stated Pain Goal: 8 (63/84/66 5993)  Complications: No notable events documented.

## 2021-11-25 NOTE — Op Note (Signed)
[facility] Patient Name: Brandon Maddox Procedure Date: 11/25/2021 8:47 AM MRN: [id number] Date of Birth: 1950/05/28 Attending MD: Norvel Richards , MD CSN: [id number] Age: 71 Admit Type: Outpatient Procedure:                Colonoscopy Indications:              High risk colon cancer surveillance: Personal                            history of colonic polyps Providers:                Norvel Richards, MD, Tammy Vaught, RN,                            Kristine L. Risa Grill, Technician, Ladoris Gene,                            [employer]chant [employer] officer Referring MD:              Medicines:                Propofol per Anesthesia Complications:            No immediate complications. Estimated Blood Loss:     Estimated blood loss was minimal. Procedure:                Pre-Anesthesia Assessment:                           - Prior to the procedure, a History and Physical                            was performed, and patient medications and                            allergies were reviewed. The patient's tolerance of                            previous anesthesia was also reviewed. The risks                            and benefits of the procedure and the sedation                            options and risks were discussed with the patient.                            All questions were answered, and informed consent                            was obtained. Prior Anticoagulants: The patient has                            taken no previous anticoagulant or antiplatelet                            agents. ASA  Grade Assessment: III - A patient with                            severe systemic disease. After reviewing the risks                            and benefits, the patient was deemed in                            satisfactory condition to undergo the procedure.                           After obtaining informed consent, the colonoscope                            was passed under direct vision.  Throughout the                            procedure, the patient's blood pressure, pulse, and                            oxygen saturations were monitored continuously. The                            [id number]) scope was introduced through                            the anus and advanced to the the cecum, identified                            by appendiceal orifice and ileocecal valve. The                            colonoscopy was performed without difficulty. The                            patient tolerated the procedure well. The quality                            of the bowel preparation was adequate. Scope In: 9:05:04 AM Scope Out: 9:20:53 AM Scope Withdrawal Time: 0 hours 11 minutes 54 seconds  Total Procedure Duration: 0 hours 15 minutes 49 seconds  Findings:      The perianal and digital rectal examinations were normal.      A 12 mm polyp was found in the ascending colon. The polyp was sessile.       The polyp was removed with a hot snare. Resection and retrieval were       complete. Estimated blood loss: none.      A 5 mm polyp was found in the ascending colon. The polyp was sessile.       The polyp was removed with a cold snare. Resection and retrieval were       complete. Estimated blood loss was minimal.      Scattered small and large-mouthed diverticula were found in the entire  colon. 1 hemostasis clip placed on larger polypectomy site      The exam was otherwise without abnormality. Rectal mucosa seen well on       face; too small to retroflex. Impression:               - One 12 mm polyp in the ascending colon, removed                            with a hot snare. Resected and retrieved.                           - One 5 mm polyp in the ascending colon, removed                            with a cold snare. Resected and retrieved.                           - Diverticulosis in the entire examined colon.                           - The examination was otherwise  normal. Clip placed                            x 1 Moderate Sedation:      Moderate (conscious) sedation was personally administered by an       anesthesia professional. The following parameters were monitored: oxygen       saturation, heart rate, blood pressure, respiratory rate, EKG, adequacy       of pulmonary ventilation, and response to care. Recommendation:           - Patient has a contact number available for                            emergencies. The signs and symptoms of potential                            delayed complications were discussed with the                            patient. Return to normal activities tomorrow.                            Written discharge instructions were provided to the                            patient.                           - Resume previous diet.                           - Continue present medications.                           - Repeat colonoscopy at next available appointment                            (  within 3 months) for surveillance.                           - Return to GI office (date not yet determined). No                            MRI until clip gone. Resume Eliquis today. Procedure Code(s):        --- Professional ---                           [phone], Colonoscopy, flexible; with removal of                            tumor(s), polyp(s), or other lesion(s) by snare                            technique Diagnosis Code(s):        --- Professional ---                           Z86.010, Personal history of colonic polyps                           K63.5, Polyp of colon                           K57.30, Diverticulosis of large intestine without                            perforation or abscess without bleeding CPT copyright 2019 [employer]. All rights reserved. The codes documented in this report are preliminary and upon coder review may  be revised to meet current compliance requirements. Brandon Maddox. Brandon Newsom,  MD Norvel Richards, MD 11/25/2021 9:29:11 AM This report has been signed electronically. Number of Addenda: 0

## 2021-11-25 NOTE — H&P (Signed)
$'@LOGO'E$ @   Primary Care Physician:  [name], MD Primary Gastroenterologist:  Dr. [name]  Pre-Procedure History & Physical: HPI:  Brandon Maddox is a 71 y.o. male here for surveillance colonoscopy.  History of multiple colonic adenomas removed previously.  Past Medical History:  Diagnosis Date   Acid reflux    Atrial fibrillation (HCC)    Closed right scapular fracture    Depression    GERD (gastroesophageal reflux disease)    Gout    Low testosterone    OSA on CPAP    Polycythemia vera ([place]) 01/17/2017   Prostate cancer ([place])    Ribs, multiple fractures    Wears glasses     Past Surgical History:  Procedure Laterality Date   CARDIOVERSION N/A 04/02/2021   Procedure: CARDIOVERSION;  Surgeon: Satira Sark, MD;  [facility];  Service: Cardiovascular;  Laterality: N/A;   COLONOSCOPY WITH PROPOFOL N/A 06/01/2018   Surgeon: Daneil Dolin, MD; 2 tubular adenomas, benign polyps, pancolonic diverticulosis.  Surveillance in 3 years.   CYSTOSCOPY N/A 06/26/2020   Procedure: CYSTOSCOPY;  Surgeon: Cleon Gustin, MD;  Location: [facility];  Service: Urology;  Laterality: N/A;   HIP SURGERY Right 2010   Dr. Dennis Bast and then replacement of right hip   LACERATION REPAIR  05/03/2012   Procedure: REPAIR MULTIPLE LACERATIONS;  Surgeon: Jerrell Belfast, MD;  Location: [place];  Service: ENT;  Laterality: N/A;   PENILE PROSTHESIS IMPLANT     PENILE PROSTHESIS IMPLANT  2014   POLYPECTOMY  06/01/2018   Procedure: POLYPECTOMY;  Surgeon: Daneil Dolin, MD;  [facility];  Service: Endoscopy;;  cecum,hepatic flexure   Pyloric stenosis     24 weeks old   RADIOACTIVE SEED IMPLANT N/A 06/26/2020   Procedure: RADIOACTIVE SEED IMPLANT/BRACHYTHERAPY IMPLANT;  Surgeon: Cleon Gustin, MD;  Location: [facility];  Service: Urology;  Laterality: N/A;   SPACE OAR INSTILLATION N/A 06/26/2020   Procedure: SPACE OAR INSTILLATION;  Surgeon:  Cleon Gustin, MD;  [facility];  Service: Urology;  Laterality: N/A;    Prior to Admission medications   Medication Sig Start Date End Date Taking? Authorizing Provider  allopurinol (ZYLOPRIM) 300 MG tablet Take 300 mg by mouth in the morning. 04/29/15  Yes [provider]  buPROPion (WELLBUTRIN XL) 150 MG 24 hr tablet Take 450 mg by mouth in the morning. 08/26/15  Yes [provider]  cholecalciferol (VITAMIN D3) 25 MCG (1000 UNIT) tablet Take 1,000 Units by mouth in the morning.   Yes [provider]  ELIQUIS 5 MG TABS tablet TAKE ONE TABLET BY MOUTH TWICE A DAY 09/25/21  Yes Satira Sark, MD  flecainide (TAMBOCOR) 50 MG tablet TAKE ONE TABLET ('50MG'$  TOTAL) BY MOUTH TWO TIMES DAILY 09/04/21  Yes Satira Sark, MD  metoprolol succinate (TOPROL XL) 25 MG 24 hr tablet Take 1 tablet (25 mg total) by mouth daily. 08/27/15  Yes Satira Sark, MD  omeprazole (PRILOSEC OTC) 20 MG tablet Take 20 mg by mouth in the morning. 05/09/12  Yes [provider]  polyethylene glycol-electrolytes (NULYTELY) 420 g solution As directed 10/15/21  Yes Lucia Harm, Cristopher Estimable, MD  Tamsulosin HCl (FLOMAX) 0.4 MG CAPS Take 0.4 mg by mouth in the morning and at bedtime.   Yes [provider]  vitamin B-12 (CYANOCOBALAMIN) 1000 MCG tablet Take 1,000 mcg by mouth every other day. In the morning.   Yes [provider]    Allergies as of 10/15/2021 - Review Complete 10/12/2021  Allergen Reaction Noted   Penicillins Other (See Comments) 01/07/2011    Family History  Problem Relation Age of Onset   Diabetes Mother    Breast cancer Mother    Diabetes Father    Colon cancer Neg Hx    Pancreatic cancer Neg Hx    Prostate cancer Neg Hx     Social History   Socioeconomic History   Marital status: Divorced    Spouse name: Not on file   Number of children: 1   Years of education: Not on file   Highest education level: Not on file   Occupational History    Employer: SELF EMPLOYED    Comment: retired  Tobacco Use   Smoking status: Former    Packs/day: 1.00    Years: 10.00    Total pack years: 10.00    Types: Cigarettes    Quit date: 04/20/2003    Years since quitting: 18.6   Smokeless tobacco: Never  Vaping Use   Vaping Use: Never used  Substance and Sexual Activity   Alcohol use: No    Alcohol/week: 0.0 standard drinks of alcohol    Comment: History of alcohol abuse in the past; none in 7 years   Drug use: No   Sexual activity: Yes  Other Topics Concern   Not on file  Social History Narrative   Not on file   Social Determinants of Health   Financial Resource Strain: Not on file  Food Insecurity: Not on file  Transportation Needs: Not on file  Physical Activity: Not on file  Stress: Not on file  Social Connections: Not on file  Intimate Partner Violence: Not on file    Review of Systems: See HPI, otherwise negative ROS  Physical Exam: BP 113/65   Pulse 60   Temp 98.6 F (37 C) (Oral)   Resp 17   Ht '5\' 10"'$  (1.778 m)   Wt 98 kg   SpO2 96%   BMI 31.00 kg/m  General:   Alert,  Well-developed, well-nourished, pleasant and cooperative in NAD Neck:  Supple; no masses or thyromegaly. No significant cervical adenopathy. Lungs:  Clear throughout to auscultation.   No wheezes, crackles, or rhonchi. No acute distress. Heart:  Regular rate and rhythm; no murmurs, clicks, rubs,  or gallops. Abdomen: Non-distended, normal bowel sounds.  Soft and nontender without appreciable mass or hepatosplenomegaly.  Pulses:  Normal pulses noted. Extremities:  Without clubbing or edema.  Impression/Plan: 71 year old gentleman with a history of colonic adenoma; here for surveillance colonoscopy. I have offered the patient a surveillance colonoscopy today. The risks, benefits, limitations, alternatives and imponderables have been reviewed with the patient. Questions have been answered. All parties are agreeable.        Notice: This dictation was prepared with Dragon dictation along with smaller phrase technology. Any transcriptional errors that result from this process are unintentional and may not be corrected upon review.

## 2021-11-26 LAB — SURGICAL PATHOLOGY

## 2021-11-27 ENCOUNTER — Encounter: Payer: Self-pay | Admitting: Internal Medicine

## 2021-11-30 ENCOUNTER — Encounter (HOSPITAL_COMMUNITY): Payer: Self-pay | Admitting: Internal Medicine

## 2021-12-01 DIAGNOSIS — E119 Type 2 diabetes mellitus without complications: Secondary | ICD-10-CM | POA: Diagnosis not present

## 2021-12-01 DIAGNOSIS — E785 Hyperlipidemia, unspecified: Secondary | ICD-10-CM | POA: Diagnosis not present

## 2021-12-01 DIAGNOSIS — D075 Carcinoma in situ of prostate: Secondary | ICD-10-CM | POA: Diagnosis not present

## 2021-12-01 DIAGNOSIS — I4821 Permanent atrial fibrillation: Secondary | ICD-10-CM | POA: Diagnosis not present

## 2021-12-01 DIAGNOSIS — Z79899 Other long term (current) drug therapy: Secondary | ICD-10-CM | POA: Diagnosis not present

## 2021-12-01 DIAGNOSIS — M109 Gout, unspecified: Secondary | ICD-10-CM | POA: Diagnosis not present

## 2021-12-08 DIAGNOSIS — R7309 Other abnormal glucose: Secondary | ICD-10-CM | POA: Diagnosis not present

## 2021-12-08 DIAGNOSIS — Z8546 Personal history of malignant neoplasm of prostate: Secondary | ICD-10-CM | POA: Diagnosis not present

## 2021-12-08 DIAGNOSIS — I480 Paroxysmal atrial fibrillation: Secondary | ICD-10-CM | POA: Diagnosis not present

## 2021-12-08 DIAGNOSIS — M109 Gout, unspecified: Secondary | ICD-10-CM | POA: Diagnosis not present

## 2021-12-16 ENCOUNTER — Other Ambulatory Visit: Payer: PPO

## 2021-12-16 DIAGNOSIS — C61 Malignant neoplasm of prostate: Secondary | ICD-10-CM

## 2021-12-17 LAB — PSA: Prostate Specific Ag, Serum: 0.7 ng/mL (ref 0.0–4.0)

## 2021-12-18 ENCOUNTER — Other Ambulatory Visit: Payer: PPO

## 2021-12-22 ENCOUNTER — Telehealth: Payer: Self-pay

## 2021-12-22 NOTE — Telephone Encounter (Signed)
Made patient aware that his PSA was normal and patient voiced understanding.

## 2021-12-22 NOTE — Telephone Encounter (Signed)
-----   Message from Cleon Gustin, MD sent at 12/22/2021  9:48 AM EDT ----- normal ----- Message ----- From: Sherrilyn Rist, CMA Sent: 12/17/2021  12:01 PM EDT To: Cleon Gustin, MD; [facility], LPN  Please Review

## 2021-12-25 ENCOUNTER — Ambulatory Visit: Payer: PPO | Admitting: Urology

## 2021-12-28 ENCOUNTER — Ambulatory Visit: Payer: PPO | Admitting: Urology

## 2021-12-28 ENCOUNTER — Encounter: Payer: Self-pay | Admitting: Urology

## 2021-12-28 VITALS — BP 141/73 | HR 67

## 2021-12-28 DIAGNOSIS — R351 Nocturia: Secondary | ICD-10-CM

## 2021-12-28 DIAGNOSIS — N401 Enlarged prostate with lower urinary tract symptoms: Secondary | ICD-10-CM | POA: Diagnosis not present

## 2021-12-28 DIAGNOSIS — N138 Other obstructive and reflux uropathy: Secondary | ICD-10-CM

## 2021-12-28 DIAGNOSIS — C61 Malignant neoplasm of prostate: Secondary | ICD-10-CM | POA: Diagnosis not present

## 2021-12-28 LAB — URINALYSIS, ROUTINE W REFLEX MICROSCOPIC
Bilirubin, UA: NEGATIVE
Glucose, UA: NEGATIVE
Ketones, UA: NEGATIVE
Leukocytes,UA: NEGATIVE
Nitrite, UA: NEGATIVE
Protein,UA: NEGATIVE
RBC, UA: NEGATIVE
Specific Gravity, UA: >1.03 — ABNORMAL HIGH (ref 1.005–1.030)
Urobilinogen, Ur: 1 mg/dL (ref 0.2–1.0)
pH, UA: 5.5 (ref 5.0–7.5)

## 2021-12-28 MED ORDER — TAMSULOSIN HCL 0.4 MG PO CAPS
0.4000 mg | ORAL_CAPSULE | Freq: Two times a day (BID) | ORAL | 3 refills | Status: DC
Start: 2021-12-28 — End: 2022-06-28

## 2021-12-28 NOTE — Patient Instructions (Signed)

## 2021-12-28 NOTE — Progress Notes (Signed)
12/28/2021 9:28 AM   Brandon Maddox 12/20/1950 [id number]  Referring provider: [name], MD [address] [place],  [place] [address]  Followup prostate cancer and BPH   HPI: [name] is a 71yo here for followup for prostate cancer and BPH with nocturia. PSA 0.7. IPSS 3 QOL 1 on flomax 0.'4mg'$  daily. Nocturia 1x. Urine stream strong. No straining to urinate. No other complaints today   PMH: Past Medical History:  Diagnosis Date   Acid reflux    Atrial fibrillation (HCC)    Closed right scapular fracture    Depression    GERD (gastroesophageal reflux disease)    Gout    Low testosterone    OSA on CPAP    Polycythemia vera ([place]) 01/17/2017   Prostate cancer ([place])    Ribs, multiple fractures    Wears glasses     Surgical History: Past Surgical History:  Procedure Laterality Date   CARDIOVERSION N/A 04/02/2021   Procedure: CARDIOVERSION;  Surgeon: Satira Sark, MD;  [facility];  Service: Cardiovascular;  Laterality: N/A;   COLONOSCOPY WITH PROPOFOL N/A 06/01/2018   Surgeon: Daneil Dolin, MD; 2 tubular adenomas, benign polyps, pancolonic diverticulosis.  Surveillance in 3 years.   COLONOSCOPY WITH PROPOFOL N/A 11/25/2021   Procedure: COLONOSCOPY WITH PROPOFOL;  Surgeon: Daneil Dolin, MD;  [facility];  Service: Endoscopy;  Laterality: N/A;  9:15am   CYSTOSCOPY N/A 06/26/2020   Procedure: CYSTOSCOPY;  Surgeon: Cleon Gustin, MD;  Location: [facility];  Service: Urology;  Laterality: N/A;   HEMOSTASIS CLIP PLACEMENT  11/25/2021   Procedure: HEMOSTASIS CLIP PLACEMENT;  Surgeon: Daneil Dolin, MD;  [facility];  Service: Endoscopy;;   HIP SURGERY Right 2010   Dr. Dennis Bast and then replacement of right hip   LACERATION REPAIR  05/03/2012   Procedure: [place];  Surgeon: Jerrell Belfast, MD;  Location: [place];  Service: ENT;  Laterality: N/A;   PENILE PROSTHESIS IMPLANT     PENILE  PROSTHESIS IMPLANT  2014   POLYPECTOMY  06/01/2018   Procedure: POLYPECTOMY;  Surgeon: Daneil Dolin, MD;  [facility];  Service: Endoscopy;;  cecum,hepatic flexure   POLYPECTOMY  11/25/2021   Procedure: POLYPECTOMY;  Surgeon: Daneil Dolin, MD;  [facility];  Service: Endoscopy;;   Pyloric stenosis     31 weeks old   RADIOACTIVE SEED IMPLANT N/A 06/26/2020   Procedure: RADIOACTIVE SEED IMPLANT/BRACHYTHERAPY IMPLANT;  Surgeon: Cleon Gustin, MD;  [facility];  Service: Urology;  Laterality: N/A;   SPACE OAR INSTILLATION N/A 06/26/2020   Procedure: SPACE OAR INSTILLATION;  Surgeon: Cleon Gustin, MD;  Location: [facility];  Service: Urology;  Laterality: N/A;    Home Medications:  Allergies as of 12/28/2021       Reactions   Penicillins Other (See Comments)   Mother told him throat swelled up when he took at a young age  Did it involve swelling of the face/tongue/throat, SOB, or low BP? Yes Did it involve sudden or severe rash/hives, skin peeling, or any reaction on the inside of your mouth or nose? Unknown Did you need to seek medical attention at a hospital or doctor's office? Yes When did it last happen?  Childhood reaction    If all above answers are "NO", may proceed with cephalosporin use.        Medication List  Accurate as of December 28, 2021  9:28 AM. If you have any questions, ask your nurse or doctor.          allopurinol 300 MG tablet Commonly known as: ZYLOPRIM Take 300 mg by mouth in the morning.   buPROPion 150 MG 24 hr tablet Commonly known as: WELLBUTRIN XL Take 450 mg by mouth in the morning.   cholecalciferol 25 MCG (1000 UNIT) tablet Commonly known as: VITAMIN D3 Take 1,000 Units by mouth in the morning.   cyanocobalamin 1000 MCG tablet Commonly known as: VITAMIN B12 Take 1,000 mcg by mouth every other day. In the morning.   Eliquis 5 MG Tabs tablet Generic  drug: apixaban TAKE ONE TABLET BY MOUTH TWICE A DAY   flecainide 50 MG tablet Commonly known as: TAMBOCOR TAKE ONE TABLET ('50MG'$  TOTAL) BY MOUTH TWO TIMES DAILY   metoprolol succinate 25 MG 24 hr tablet Commonly known as: Toprol XL Take 1 tablet (25 mg total) by mouth daily.   omeprazole 20 MG tablet Commonly known as: PRILOSEC OTC Take 20 mg by mouth in the morning.   polyethylene glycol-electrolytes 420 g solution Commonly known as: NuLYTELY As directed   tamsulosin 0.4 MG Caps capsule Commonly known as: FLOMAX Take 0.4 mg by mouth in the morning and at bedtime.        Allergies:  Allergies  Allergen Reactions   Penicillins Other (See Comments)    Mother told him throat swelled up when he took at a young age  Did it involve swelling of the face/tongue/throat, SOB, or low BP? Yes Did it involve sudden or severe rash/hives, skin peeling, or any reaction on the inside of your mouth or nose? Unknown Did you need to seek medical attention at a hospital or doctor's office? Yes When did it last happen?  Childhood reaction    If all above answers are "NO", may proceed with cephalosporin use.     Family History: Family History  Problem Relation Age of Onset   Diabetes Mother    Breast cancer Mother    Diabetes Father    Colon cancer Neg Hx    Pancreatic cancer Neg Hx    Prostate cancer Neg Hx     Social History:  reports that he quit smoking about 18 years ago. His smoking use included cigarettes. He has a 10.00 pack-year smoking history. He has never used smokeless tobacco. He reports that he does not drink alcohol and does not use drugs.  ROS: All other review of systems were reviewed and are negative except what is noted above in HPI  Physical Exam: BP (!) 141/73   Pulse 67   Constitutional:  Alert and oriented, No acute distress. HEENT: [place] AT, moist mucus membranes.  Trachea midline, no masses. Cardiovascular: No clubbing, cyanosis, or edema. Respiratory:  Normal respiratory effort, no increased work of breathing. GI: Abdomen is soft, nontender, nondistended, no abdominal masses GU: No CVA tenderness.  Lymph: No cervical or inguinal lymphadenopathy. Skin: No rashes, bruises or suspicious lesions. Neurologic: Grossly intact, no focal deficits, moving all 4 extremities. Psychiatric: Normal mood and affect.  Laboratory Data: Lab Results  Component Value Date   WBC 5.5 08/07/2021   HGB 13.1 08/07/2021   HCT 42.8 08/07/2021   MCV 81.1 08/07/2021   PLT 213 08/07/2021    Lab Results  Component Value Date   CREATININE 1.43 (H) 03/31/2021    No results found for: "PSA"  No results found for: "TESTOSTERONE"  Lab Results  Component Value Date   HGBA1C 5.9 (H) 04/02/2013    Urinalysis    Component Value Date/Time   COLORURINE YELLOW 09/10/2008 0630   APPEARANCEUR Clear 07/11/2020 1349   LABSPEC 1.025 09/10/2008 0630   PHURINE 5.5 09/10/2008 0630   GLUCOSEU Negative 07/11/2020 1349   HGBUR TRACE (A) 09/10/2008 0630   BILIRUBINUR Negative 07/11/2020 1349   KETONESUR NEGATIVE 09/10/2008 0630   PROTEINUR Negative 07/11/2020 1349   PROTEINUR NEGATIVE 09/10/2008 0630   UROBILINOGEN 0.2 09/10/2008 0630   NITRITE Negative 07/11/2020 1349   NITRITE NEGATIVE 09/10/2008 0630   LEUKOCYTESUR Negative 07/11/2020 1349    Lab Results  Component Value Date   LABMICR See below: 07/11/2020   WBCUA None seen 07/11/2020   LABEPIT None seen 07/11/2020   BACTERIA None seen 07/11/2020    Pertinent Imaging:  No results found for this or any previous visit.  No results found for this or any previous visit.  No results found for this or any previous visit.  No results found for this or any previous visit.  No results found for this or any previous visit.  No results found for this or any previous visit.  No results found for this or any previous visit.  No results found for this or any previous visit.   Assessment & Plan:    1.  Prostate cancer ([place]) -RTC 6 months with PSA - Urinalysis, Routine w reflex microscopic  2. Nocturia Continue flomax 0.'4mg'$  daily  3. Benign prostatic hyperplasia with urinary obstruction Continue flomax 0.'4mg'$  daily   No follow-ups on file.  Nicolette Bang, MD  [facility] [place]

## 2022-01-27 ENCOUNTER — Encounter: Payer: Self-pay | Admitting: Cardiology

## 2022-01-27 ENCOUNTER — Ambulatory Visit: Payer: PPO | Attending: Cardiology | Admitting: Cardiology

## 2022-01-27 VITALS — BP 114/72 | HR 64 | Ht 70.5 in | Wt 214.0 lb

## 2022-01-27 DIAGNOSIS — I4819 Other persistent atrial fibrillation: Secondary | ICD-10-CM

## 2022-01-27 DIAGNOSIS — G4733 Obstructive sleep apnea (adult) (pediatric): Secondary | ICD-10-CM | POA: Diagnosis not present

## 2022-01-27 NOTE — Patient Instructions (Signed)

## 2022-01-27 NOTE — Progress Notes (Signed)
Cardiology Office Note  Date: 01/27/2022   ID: Brandon Maddox, DOB [birth year], MRN [id number]  PCP:  [name], MD  Cardiologist:  Rozann Lesches, MD Electrophysiologist:  None   Chief Complaint  Patient presents with   Cardiac follow-up    History of Present Illness: Brandon Maddox is a 71 y.o. male last seen in January.  He is here for a routine visit.  States that he feels well, good energy and no sense of palpitations or chest pain.  We went over his medications which are stable.  He does not report any spontaneous bleeding problems on Eliquis.  Rhythm has been well controlled on flecainide and Toprol-XL since cardioversion back in December 2022.  He continues to follow regular with Dr. Willey Blade for routine lab work.  Past Medical History:  Diagnosis Date   Acid reflux    Atrial fibrillation (HCC)    Closed right scapular fracture    Depression    GERD (gastroesophageal reflux disease)    Gout    Low testosterone    OSA on CPAP    Polycythemia vera ([place]) 01/17/2017   Prostate cancer ([place])    Ribs, multiple fractures    Wears glasses     Past Surgical History:  Procedure Laterality Date   CARDIOVERSION N/A 04/02/2021   Procedure: CARDIOVERSION;  Surgeon: Satira Sark, MD;  [facility];  Service: Cardiovascular;  Laterality: N/A;   COLONOSCOPY WITH PROPOFOL N/A 06/01/2018   Surgeon: Daneil Dolin, MD; 2 tubular adenomas, benign polyps, pancolonic diverticulosis.  Surveillance in 3 years.   COLONOSCOPY WITH PROPOFOL N/A 11/25/2021   Procedure: COLONOSCOPY WITH PROPOFOL;  Surgeon: Daneil Dolin, MD;  [facility];  Service: Endoscopy;  Laterality: N/A;  9:15am   CYSTOSCOPY N/A 06/26/2020   Procedure: CYSTOSCOPY;  Surgeon: Cleon Gustin, MD;  Location: [facility];  Service: Urology;  Laterality: N/A;   HEMOSTASIS CLIP PLACEMENT  11/25/2021   Procedure: HEMOSTASIS CLIP PLACEMENT;  Surgeon: Daneil Dolin, MD;  [facility];  Service: Endoscopy;;   HIP SURGERY Right 2010   Dr. Dennis Bast and then replacement of right hip   LACERATION REPAIR  05/03/2012   Procedure: [place];  Surgeon: Jerrell Belfast, MD;  Location: [place];  Service: ENT;  Laterality: N/A;   PENILE PROSTHESIS IMPLANT     PENILE PROSTHESIS IMPLANT  2014   POLYPECTOMY  06/01/2018   Procedure: POLYPECTOMY;  Surgeon: Daneil Dolin, MD;  [facility];  Service: Endoscopy;;  cecum,hepatic flexure   POLYPECTOMY  11/25/2021   Procedure: POLYPECTOMY;  Surgeon: Daneil Dolin, MD;  [facility];  Service: Endoscopy;;   Pyloric stenosis     89 weeks old   RADIOACTIVE SEED IMPLANT N/A 06/26/2020   Procedure: RADIOACTIVE SEED IMPLANT/BRACHYTHERAPY IMPLANT;  Surgeon: Cleon Gustin, MD;  Location: [facility];  Service: Urology;  Laterality: N/A;   SPACE OAR INSTILLATION N/A 06/26/2020   Procedure: SPACE OAR INSTILLATION;  Surgeon: Cleon Gustin, MD;  Location: [facility];  Service: Urology;  Laterality: N/A;    Current Outpatient Medications  Medication Sig Dispense Refill   allopurinol (ZYLOPRIM) 300 MG tablet Take 300 mg by mouth in the morning.     buPROPion (WELLBUTRIN XL) 150 MG 24 hr tablet Take 450 mg by mouth in the morning.  4   cholecalciferol (VITAMIN D3) 25 MCG (1000 UNIT) tablet Take 1,000 Units  by mouth in the morning.     ELIQUIS 5 MG TABS tablet TAKE ONE TABLET BY MOUTH TWICE A DAY 60 tablet 6   flecainide (TAMBOCOR) 50 MG tablet TAKE ONE TABLET ('50MG'$  TOTAL) BY MOUTH TWO TIMES DAILY 180 tablet 1   metoprolol succinate (TOPROL XL) 25 MG 24 hr tablet Take 1 tablet (25 mg total) by mouth daily. 90 tablet 3   omeprazole (PRILOSEC OTC) 20 MG tablet Take 20 mg by mouth in the morning.     tamsulosin (FLOMAX) 0.4 MG CAPS capsule Take 1 capsule (0.4 mg total) by mouth in the morning and at bedtime. 90 capsule 3   vitamin B-12 (CYANOCOBALAMIN) 1000 MCG  tablet Take 1,000 mcg by mouth every other day. In the morning.     No current facility-administered medications for this visit.   Allergies:  Penicillins   ROS: No syncope.  Physical Exam: VS:  BP 114/72   Pulse 64   Ht 5' 10.5" (1.791 m)   Wt 214 lb (97.1 kg)   SpO2 98%   BMI 30.27 kg/m , BMI Body mass index is 30.27 kg/m.  Wt Readings from Last 3 Encounters:  01/27/22 214 lb (97.1 kg)  11/25/21 216 lb 0.8 oz (98 kg)  11/23/21 216 lb 0.8 oz (98 kg)    General: Patient appears comfortable at rest. HEENT: Conjunctiva and lids normal. Neck: Supple, no elevated JVP or carotid bruits. Lungs: Clear to auscultation, nonlabored breathing at rest. Cardiac: Regular rate and rhythm, no S3, 1/6 systolic murmur. Extremities: No pitting edema.  ECG:  An ECG dated 04/23/2021 was personally reviewed today and demonstrated:  Sinus rhythm with low voltage.  Recent Labwork: 03/31/2021: BUN 22; Creatinine, Ser 1.43; Potassium 4.7; Sodium 134 08/07/2021: Hemoglobin 13.1; Platelets 213   Other Studies Reviewed Today:  Echocardiogram 02/27/2021:  1. Left ventricular ejection fraction, by estimation, is 55 to 60%. The  left ventricle has normal function. The left ventricle has no regional  wall motion abnormalities. Left ventricular diastolic parameters are  indeterminate.   2. Right ventricular systolic function is normal. The right ventricular  size is normal. There is normal pulmonary artery systolic pressure.   3. Left atrial size was severely dilated.   4. The mitral valve is abnormal. Mild mitral valve regurgitation. No  evidence of mitral stenosis.   5. The tricuspid valve is abnormal.   6. The aortic valve is tricuspid. Aortic valve regurgitation is not  visualized. No aortic stenosis is present.   7. The inferior vena cava is dilated in size with >50% respiratory  variability, suggesting right atrial pressure of 8 mmHg.    Lexiscan Myoview 03/06/2021:   Findings are consistent  with no ischemia. The study is low risk.   No ST deviation was noted. The ECG was negative for ischemia.   LV perfusion is normal.   Left ventricular function is normal. Nuclear stress EF: 77 %.   Low risk test with no ischemia and vigorous LVEF at 77%.  Assessment and Plan:  1.  Persistent atrial fibrillation with CHA2DS2-VASc score of at least 1.  He is doing quite well after cardioversion in December 2022, no obvious recurring arrhythmia.  He continues on Eliquis for stroke prophylaxis, also flecainide and Toprol-XL.  Keep follow-up with Dr. Willey Blade for routine lab work.  2.  OSA on CPAP, he reports compliance.  Medication Adjustments/Labs and Tests Ordered: Current medicines are reviewed at length with the patient today.  Concerns regarding medicines are outlined above.  Tests Ordered: No orders of the defined types were placed in this encounter.   Medication Changes: No orders of the defined types were placed in this encounter.   Disposition:  Follow up  6 months.  Signed, Satira Sark, MD, [facility] 01/27/2022 3:12 PM    [address] [facility] [place], [place], [place] [address] Phone: [phone]; Fax: [phone]

## 2022-02-26 ENCOUNTER — Other Ambulatory Visit: Payer: Self-pay | Admitting: Cardiology

## 2022-04-29 ENCOUNTER — Other Ambulatory Visit: Payer: Self-pay | Admitting: Cardiology

## 2022-04-29 DIAGNOSIS — I4819 Other persistent atrial fibrillation: Secondary | ICD-10-CM

## 2022-04-29 NOTE — Telephone Encounter (Signed)
Prescription refill request for Eliquis received. Indication: AF Last office visit: 01/27/22  Myles Gip MD Scr: 1.43 on 03/31/21 Age: 72 Weight: 97.1kg  Based on above findings Eliquis '5mg'$  twice daily is the appropriate dose.  Refill approved.

## 2022-06-10 DIAGNOSIS — I4821 Permanent atrial fibrillation: Secondary | ICD-10-CM | POA: Diagnosis not present

## 2022-06-10 DIAGNOSIS — F325 Major depressive disorder, single episode, in full remission: Secondary | ICD-10-CM | POA: Diagnosis not present

## 2022-06-21 ENCOUNTER — Other Ambulatory Visit: Payer: PPO

## 2022-06-21 ENCOUNTER — Other Ambulatory Visit: Payer: Self-pay

## 2022-06-21 DIAGNOSIS — C61 Malignant neoplasm of prostate: Secondary | ICD-10-CM

## 2022-06-22 LAB — PSA: Prostate Specific Ag, Serum: 1.1 ng/mL (ref 0.0–4.0)

## 2022-06-28 ENCOUNTER — Encounter: Payer: Self-pay | Admitting: Urology

## 2022-06-28 ENCOUNTER — Ambulatory Visit (INDEPENDENT_AMBULATORY_CARE_PROVIDER_SITE_OTHER): Payer: PPO | Admitting: Urology

## 2022-06-28 VITALS — BP 117/79 | HR 3

## 2022-06-28 DIAGNOSIS — N138 Other obstructive and reflux uropathy: Secondary | ICD-10-CM | POA: Diagnosis not present

## 2022-06-28 DIAGNOSIS — C61 Malignant neoplasm of prostate: Secondary | ICD-10-CM | POA: Diagnosis not present

## 2022-06-28 DIAGNOSIS — R351 Nocturia: Secondary | ICD-10-CM

## 2022-06-28 DIAGNOSIS — N401 Enlarged prostate with lower urinary tract symptoms: Secondary | ICD-10-CM

## 2022-06-28 LAB — URINALYSIS, ROUTINE W REFLEX MICROSCOPIC
Bilirubin, UA: NEGATIVE
Glucose, UA: NEGATIVE
Leukocytes,UA: NEGATIVE
Nitrite, UA: NEGATIVE
RBC, UA: NEGATIVE
Specific Gravity, UA: 1.025 (ref 1.005–1.030)
Urobilinogen, Ur: 1 mg/dL (ref 0.2–1.0)
pH, UA: 5.5 (ref 5.0–7.5)

## 2022-06-28 LAB — MICROSCOPIC EXAMINATION
Bacteria, UA: NONE SEEN
Epithelial Cells (non renal): >10 /hpf — AB (ref 0–10)

## 2022-06-28 LAB — BLADDER SCAN AMB NON-IMAGING: Scan Result: 58

## 2022-06-28 MED ORDER — SILODOSIN 8 MG PO CAPS: 8.0000 mg | ORAL_CAPSULE | Freq: Every day | ORAL | 11 refills | Status: DC

## 2022-06-28 NOTE — Progress Notes (Unsigned)
06/28/2022 9:20 AM   Brandon Maddox 08/15/1950 [id number]  Referring provider: [name], MD [address] [place],  [place] [address]  Followup prostate cancer and BPH   HPI: Brandon Maddox is a 72yo here for followup for prostate cancer and BPH. PSA increased to 1.1 from 0.7. IPSS 12 QOl 5. Nocturia 3x. Urine stream is fair. He has urinary frequency every 60-90 minutes. He has a feeling of incomplete emptying. No dysuria or hematuria   PMH: Past Medical History:  Diagnosis Date   Acid reflux    Atrial fibrillation (HCC)    Closed right scapular fracture    Depression    GERD (gastroesophageal reflux disease)    Gout    Low testosterone    OSA on CPAP    Polycythemia vera ([place]) 01/17/2017   Prostate cancer ([place])    Ribs, multiple fractures    Wears glasses     Surgical History: Past Surgical History:  Procedure Laterality Date   CARDIOVERSION N/A 04/02/2021   Procedure: CARDIOVERSION;  Surgeon: Satira Sark, MD;  Location: [facility];  Service: Cardiovascular;  Laterality: N/A;   COLONOSCOPY WITH PROPOFOL N/A 06/01/2018   Surgeon: Daneil Dolin, MD; 2 tubular adenomas, benign polyps, pancolonic diverticulosis.  Surveillance in 3 years.   COLONOSCOPY WITH PROPOFOL N/A 11/25/2021   Procedure: COLONOSCOPY WITH PROPOFOL;  Surgeon: Daneil Dolin, MD;  [facility];  Service: Endoscopy;  Laterality: N/A;  9:15am   CYSTOSCOPY N/A 06/26/2020   Procedure: CYSTOSCOPY;  Surgeon: Cleon Gustin, MD;  Location: [facility];  Service: Urology;  Laterality: N/A;   HEMOSTASIS CLIP PLACEMENT  11/25/2021   Procedure: HEMOSTASIS CLIP PLACEMENT;  Surgeon: Daneil Dolin, MD;  [facility];  Service: Endoscopy;;   HIP SURGERY Right 2010   Dr. 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   RADIOACTIVE SEED IMPLANT N/A 06/26/2020   Procedure: RADIOACTIVE SEED IMPLANT/BRACHYTHERAPY IMPLANT;  Surgeon: Cleon Gustin, MD;  Location: [facility];  Service: Urology;  Laterality: N/A;   SPACE OAR INSTILLATION N/A 06/26/2020   Procedure: SPACE OAR INSTILLATION;  Surgeon: Cleon Gustin, MD;  Location: [facility];  Service: Urology;  Laterality: N/A;    Home Medications:  Allergies as of 06/28/2022       Reactions   Penicillins Other (See Comments)   Mother told him throat swelled up when he took at a young age  Did it involve swelling of the face/tongue/throat, SOB, or low BP? Yes Did it involve sudden or severe rash/hives, skin peeling, or any reaction on the inside of your mouth or nose? Unknown Did you need to seek medical attention at a hospital or doctor's office? Yes When did it last happen?  Childhood reaction    If all above answers are "NO", may proceed with cephalosporin use.  Medication List        Accurate as of June 28, 2022  9:20 AM. If you have any questions, ask your nurse or doctor.          allopurinol 300 MG tablet Commonly known as: ZYLOPRIM Take 300 mg by mouth in the morning.   buPROPion 150 MG 24 hr tablet Commonly known as: WELLBUTRIN XL Take 450 mg by mouth in the morning.   cholecalciferol 25 MCG (1000 UNIT) tablet Commonly known as: VITAMIN D3 Take 1,000 Units by mouth in the morning.   cyanocobalamin 1000 MCG tablet Commonly known as: VITAMIN B12 Take 1,000 mcg by mouth every other day. In the morning.    Eliquis 5 MG Tabs tablet Generic drug: apixaban TAKE ONE TABLET BY MOUTH TWICE A DAY   flecainide 50 MG tablet Commonly known as: TAMBOCOR TAKE ONE TABLET ('50MG'$  TOTAL) BY MOUTH TWO TIMES DAILY   metoprolol succinate 25 MG 24 hr tablet Commonly known as: Toprol XL Take 1 tablet (25 mg total) by mouth daily.   omeprazole 20 MG tablet Commonly known as: PRILOSEC OTC Take 20 mg by mouth in the morning.   tamsulosin 0.4 MG Caps capsule Commonly known as: FLOMAX Take 1 capsule (0.4 mg total) by mouth in the morning and at bedtime.        Allergies:  Allergies  Allergen Reactions   Penicillins Other (See Comments)    Mother told him throat swelled up when he took at a young age  Did it involve swelling of the face/tongue/throat, SOB, or low BP? Yes Did it involve sudden or severe rash/hives, skin peeling, or any reaction on the inside of your mouth or nose? Unknown Did you need to seek medical attention at a hospital or doctor's office? Yes When did it last happen?  Childhood reaction    If all above answers are "NO", may proceed with cephalosporin use.     Family History: Family History  Problem Relation Age of Onset   Diabetes Mother    Breast cancer Mother    Diabetes Father    Colon cancer Neg Hx    Pancreatic cancer Neg Hx    Prostate cancer Neg Hx     Social History:  reports that he quit smoking about 19 years ago. His smoking use included cigarettes. He has a 10.00 pack-year smoking history. He has never used smokeless tobacco. He reports that he does not drink alcohol and does not use drugs.  ROS: All other review of systems were reviewed and are negative except what is noted above in HPI  Physical Exam: BP 117/79   Pulse (!) 3   Constitutional:  Alert and oriented, No acute distress. HEENT:  AT, moist mucus membranes.  Trachea midline, no masses. Cardiovascular: No clubbing, cyanosis, or edema. Respiratory: Normal respiratory effort, no increased work  of breathing. GI: Abdomen is soft, nontender, nondistended, no abdominal masses GU: No CVA tenderness.  Lymph: No cervical or inguinal lymphadenopathy. Skin: No rashes, bruises or suspicious lesions. Neurologic: Grossly intact, no focal deficits, moving all 4 extremities. Psychiatric: Normal mood and affect.  Laboratory Data: Lab Results  Component Value Date   WBC 5.5 08/07/2021   HGB 13.1 08/07/2021   HCT 42.8 08/07/2021   MCV 81.1 08/07/2021   PLT 213 08/07/2021    Lab Results  Component Value Date   CREATININE 1.43 (H) 03/31/2021    No results found for: "PSA"  No results found for: "TESTOSTERONE"  Lab Results  Component Value Date   HGBA1C 5.9 (H) 04/02/2013    Urinalysis    Component Value Date/Time   COLORURINE YELLOW 09/10/2008 0630   APPEARANCEUR Clear 12/28/2021 1010   LABSPEC 1.025 09/10/2008 0630   PHURINE 5.5 09/10/2008 0630   GLUCOSEU Negative 12/28/2021 1010   HGBUR TRACE (A) 09/10/2008 0630   BILIRUBINUR Negative 12/28/2021 1010   KETONESUR NEGATIVE 09/10/2008 0630   PROTEINUR Negative 12/28/2021 1010   PROTEINUR NEGATIVE 09/10/2008 0630   UROBILINOGEN 0.2 09/10/2008 0630   NITRITE Negative 12/28/2021 1010   NITRITE NEGATIVE 09/10/2008 0630   LEUKOCYTESUR Negative 12/28/2021 1010    Lab Results  Component Value Date   LABMICR Comment 12/28/2021   WBCUA None seen 07/11/2020   LABEPIT None seen 07/11/2020   BACTERIA None seen 07/11/2020    Pertinent Imaging:  No results found for this or any previous visit.  No results found for this or any previous visit.  No results found for this or any previous visit.  No results found for this or any previous visit.  No results found for this or any previous visit.  No valid procedures specified. No results found for this or any previous visit.  No results found for this or any previous visit.   Assessment & Plan:    1. Prostate cancer ([place]) Followup 6 months with PSA - Urinalysis,  Routine w reflex microscopic  2. Nocturia -rapaflo '8mg'$  daily  3. Benign prostatic hyperplasia with urinary obstruction Rapaflo '8mg'$  daily   Return in about 6 months (around 12/29/2022) for PSA.  Nicolette Bang, MD  [facility] [place]

## 2022-06-28 NOTE — Patient Instructions (Signed)

## 2022-06-28 NOTE — Progress Notes (Unsigned)
post void residual=58 

## 2022-07-07 ENCOUNTER — Encounter (HOSPITAL_COMMUNITY): Payer: Self-pay | Admitting: *Deleted

## 2022-07-07 ENCOUNTER — Emergency Department (HOSPITAL_COMMUNITY): Payer: PPO

## 2022-07-07 ENCOUNTER — Other Ambulatory Visit: Payer: Self-pay

## 2022-07-07 ENCOUNTER — Ambulatory Visit
Admission: EM | Admit: 2022-07-07 | Discharge: 2022-07-07 | Disposition: A | Discharge status: Home / Self Care | Payer: PPO

## 2022-07-07 ENCOUNTER — Emergency Department (HOSPITAL_COMMUNITY)
Admission: EM | Admit: 2022-07-07 | Discharge: 2022-07-07 | Disposition: A | Discharge status: Home / Self Care | Payer: PPO | Attending: Emergency Medicine | Admitting: Emergency Medicine

## 2022-07-07 DIAGNOSIS — S61211A Laceration without foreign body of left index finger without damage to nail, initial encounter: Secondary | ICD-10-CM | POA: Diagnosis not present

## 2022-07-07 DIAGNOSIS — Z79899 Other long term (current) drug therapy: Secondary | ICD-10-CM | POA: Diagnosis not present

## 2022-07-07 DIAGNOSIS — I4891 Unspecified atrial fibrillation: Secondary | ICD-10-CM | POA: Diagnosis not present

## 2022-07-07 DIAGNOSIS — S6992XA Unspecified injury of left wrist, hand and finger(s), initial encounter: Secondary | ICD-10-CM | POA: Diagnosis not present

## 2022-07-07 DIAGNOSIS — Z7901 Long term (current) use of anticoagulants: Secondary | ICD-10-CM | POA: Insufficient documentation

## 2022-07-07 DIAGNOSIS — S6710XA Crushing injury of unspecified finger(s), initial encounter: Secondary | ICD-10-CM

## 2022-07-07 DIAGNOSIS — E119 Type 2 diabetes mellitus without complications: Secondary | ICD-10-CM | POA: Insufficient documentation

## 2022-07-07 DIAGNOSIS — S61213A Laceration without foreign body of left middle finger without damage to nail, initial encounter: Secondary | ICD-10-CM | POA: Diagnosis not present

## 2022-07-07 DIAGNOSIS — W231XXA Caught, crushed, jammed, or pinched between stationary objects, initial encounter: Secondary | ICD-10-CM | POA: Diagnosis not present

## 2022-07-07 DIAGNOSIS — M7989 Other specified soft tissue disorders: Secondary | ICD-10-CM | POA: Diagnosis not present

## 2022-07-07 MED ORDER — OXYCODONE-ACETAMINOPHEN 5-325 MG PO TABS
1.0000 | ORAL_TABLET | Freq: Once | ORAL | Status: AC
  Administered 2022-07-07: 1 via ORAL
  Filled 2022-07-07: qty 1

## 2022-07-07 MED ORDER — OXYCODONE-ACETAMINOPHEN 5-325 MG PO TABS
2.0000 | ORAL_TABLET | Freq: Once | ORAL | Status: AC
  Administered 2022-07-07: 2 via ORAL
  Filled 2022-07-07: qty 2

## 2022-07-07 MED ORDER — CLINDAMYCIN HCL 150 MG PO CAPS: 300.0000 mg | ORAL_CAPSULE | Freq: Three times a day (TID) | ORAL | 0 refills | Status: AC

## 2022-07-07 MED ORDER — CLINDAMYCIN HCL 150 MG PO CAPS: 300.0000 mg | ORAL_CAPSULE | Freq: Once | ORAL | Status: DC

## 2022-07-07 MED ORDER — CLINDAMYCIN HCL 150 MG PO CAPS
300.0000 mg | ORAL_CAPSULE | Freq: Once | ORAL | Status: AC
  Administered 2022-07-07: 300 mg via ORAL
  Filled 2022-07-07: qty 2

## 2022-07-07 MED ORDER — BUPIVACAINE HCL (PF) 0.25 % IJ SOLN
20.0000 mL | Freq: Once | INTRAMUSCULAR | Status: AC
  Administered 2022-07-07: 20 mL
  Filled 2022-07-07: qty 30

## 2022-07-07 MED ORDER — OXYCODONE-ACETAMINOPHEN 5-325 MG PO TABS: 1.0000 | ORAL_TABLET | Freq: Four times a day (QID) | ORAL | 0 refills | Status: DC | PRN

## 2022-07-07 NOTE — ED Triage Notes (Signed)
Pt reports s he cut the left index finger, left middle finger and left ring finger 30-40 min ago with a piece of equipment when working on his tractor. Pt reports tingling, numbness and change color in the left index finger, left middle finger and left ring finger.  Reoports he was told by his PCP couple weeks ago his Tdap is good for 3 more years.

## 2022-07-07 NOTE — Discharge Instructions (Signed)
There were prescriptions sent to your pharmacy.  Clindamycin is an antibiotic to avoid infection for open fracture.  Take as prescribed.  Percocet is a narcotic medication to treat pain.  Take only as needed.  Call the number below in the morning to set up a follow-up appointment with the hand doctor.  Return to the emergency department for any new or worsening symptoms of concern.

## 2022-07-07 NOTE — ED Provider Notes (Signed)
[facility] Provider Note   CSN: [id number] Arrival date & time: 07/07/22  1637     History {Add pertinent medical, surgical, social history, OB history to HPI:1} Chief Complaint  Patient presents with   Finger Injury    Brandon Maddox is a 72 y.o. male.  HPI Patient presents for hand injury.  Medical history includes T2DM, BPH, HLD, gout, atrial fibrillation.  Eliquis?  Earlier this afternoon, patient was feeling a piece onto the front end of his tractor.  He is left-handed caught between the pieces of metal.  He sustained wounds to fingers 2 through 4 on his left hand.  He initially went to urgent care but was directed to come to the ED.  He has not taken anything yet for pain.  He reports that his tetanus was updated within the past 2 to 3 years.  He does have a childhood allergy to penicillin and was told that his throat swelled up.  He has not taken any penicillins since that time.  He is right-handed.    Home Medications Prior to Admission medications   Medication Sig Start Date End Date Taking? Authorizing Provider  allopurinol (ZYLOPRIM) 300 MG tablet Take 300 mg by mouth in the morning. 04/29/15   [provider]  apixaban (ELIQUIS) 5 MG TABS tablet TAKE ONE TABLET BY MOUTH TWICE A DAY 04/29/22   Satira Sark, MD  buPROPion (WELLBUTRIN XL) 150 MG 24 hr tablet Take 450 mg by mouth in the morning. 08/26/15   [provider]  cholecalciferol (VITAMIN D3) 25 MCG (1000 UNIT) tablet Take 1,000 Units by mouth in the morning.    [provider]  flecainide (TAMBOCOR) 50 MG tablet TAKE ONE TABLET (50MG  TOTAL) BY MOUTH TWO TIMES DAILY 02/26/22   Satira Sark, MD  metoprolol succinate (TOPROL XL) 25 MG 24 hr tablet Take 1 tablet (25 mg total) by mouth daily. 08/27/15   Satira Sark, MD  omeprazole (PRILOSEC OTC) 20 MG tablet Take 20 mg by mouth in the morning. 05/09/12   [provider]  silodosin  (RAPAFLO) 8 MG CAPS capsule Take 1 capsule (8 mg total) by mouth at bedtime. 06/28/22   McKenzie, Candee Furbish, MD  vitamin B-12 (CYANOCOBALAMIN) 1000 MCG tablet Take 1,000 mcg by mouth every other day. In the morning.    [provider]      Allergies    Penicillins    Review of Systems   Review of Systems  Skin:  Positive for wound.  All other systems reviewed and are negative.   Physical Exam Updated Vital Signs BP (!) 128/98 (BP Location: Right Arm)   Pulse (!) 56   Temp 98 F (36.7 C) (Oral)   Resp 20   Ht 5\' 11"  (1.803 m)   Wt 96.6 kg   SpO2 99%   BMI 29.71 kg/m  Physical Exam Vitals and nursing note reviewed.  Constitutional:      General: He is not in acute distress.    Appearance: Normal appearance. He is well-developed. He is not ill-appearing, toxic-appearing or diaphoretic.  HENT:     Head: Normocephalic and atraumatic.     Right Ear: External ear normal.     Left Ear: External ear normal.     Nose: Nose normal.     Mouth/Throat:     Mouth: Mucous membranes are moist.  Eyes:     Extraocular Movements: Extraocular movements intact.  Conjunctiva/sclera: Conjunctivae normal.  Cardiovascular:     Rate and Rhythm: Normal rate and regular rhythm.  Pulmonary:     Effort: Pulmonary effort is normal. No respiratory distress.  Abdominal:     General: There is no distension.     Palpations: Abdomen is soft.  Musculoskeletal:        General: Signs of injury present. No swelling. Normal range of motion.     Cervical back: Normal range of motion and neck supple.  Skin:    General: Skin is warm and dry.     Coloration: Skin is not jaundiced or pale.     Comments: Lacerations to palmar surface of digits 2-4 on left hand.  Third digit is oozing blood.  Neurological:     General: No focal deficit present.     Mental Status: He is alert and oriented to person, place, and time.     Cranial Nerves: No cranial nerve deficit.     Motor: No weakness.      Coordination: Coordination normal.  Psychiatric:        Mood and Affect: Mood normal.        Behavior: Behavior normal.        ED Results / Procedures / Treatments   Labs (all labs ordered are listed, but only abnormal results are displayed) Labs Reviewed - No data to display  EKG None  Radiology No results found.  Procedures .Marland KitchenLaceration Repair  Date/Time: 07/07/2022 9:24 PM  Performed by: Godfrey Pick, MD Authorized by: Godfrey Pick, MD   Consent:    Consent obtained:  Verbal   Consent given by:  Patient   Risks, benefits, and alternatives were discussed: yes     Risks discussed:  Infection, pain and poor wound healing   Alternatives discussed:  No treatment and delayed treatment Universal protocol:    Procedure explained and questions answered to patient or proxy's satisfaction: yes     Imaging studies available: yes     Patient identity confirmed:  Verbally with patient Anesthesia:    Anesthesia method:  Local infiltration and nerve block   Local anesthetic:  Bupivacaine 0.25% w/o epi   Block location:  Digital, digits 2 and 3   Block needle gauge:  25 G   Block anesthetic:  Bupivacaine 0.25% w/o epi   Block technique:  Digital   Block injection procedure:  Anatomic landmarks identified, negative aspiration for blood and incremental injection   Block outcome:  Anesthesia achieved Laceration details:    Location:  Finger   Finger location:  L long finger (Left digits 2, 3 and 4)   Length (cm):  10 (10 cm total)   Depth (mm):  5 Pre-procedure details:    Preparation:  Patient was prepped and draped in usual sterile fashion and imaging obtained to evaluate for foreign bodies Exploration:    Imaging obtained: x-ray     Imaging outcome: foreign body not noted     Wound exploration: wound explored through full range of motion and entire depth of wound visualized     Contaminated: no   Treatment:    Area cleansed with:  Saline   Amount of cleaning:  Extensive    Irrigation solution:  Sterile saline   Irrigation volume:  2L   Irrigation method:  Syringe   Visualized foreign bodies/material removed: no   Skin repair:    Repair method:  Sutures   Suture size:  4-0   Suture material:  Nylon   Suture technique:  Simple interrupted   Number of sutures:  12 Approximation:    Approximation:  Loose Repair type:    Repair type:  Intermediate Post-procedure details:    Dressing: Xeroform Kerlix, and Coban.   Procedure completion:  Tolerated well, no immediate complications   {Document cardiac monitor, telemetry assessment procedure when appropriate:1}  Medications Ordered in ED Medications - No data to display  ED Course/ Medical Decision Making/ A&P   {   Click here for ABCD2, HEART and other calculatorsREFRESH Note before signing :1}                          Medical Decision Making Amount and/or Complexity of Data Reviewed Radiology: ordered.  Risk Prescription drug management.   This patient presents to the ED for concern of ***, this involves an extensive number of treatment options, and is a complaint that carries with it a high risk of complications and morbidity.  The differential diagnosis includes ***   Co morbidities that complicate the patient evaluation  ***   Additional history obtained:  Additional history obtained from *** External records from outside source obtained and reviewed including ***   Lab Tests:  I Ordered, and personally interpreted labs.  The pertinent results include:  ***   Imaging Studies ordered:  I ordered imaging studies including ***  I independently visualized and interpreted imaging which showed *** I agree with the radiologist interpretation   Cardiac Monitoring: / EKG:  The patient was maintained on a cardiac monitor.  I personally viewed and interpreted the cardiac monitored which showed an underlying rhythm of: ***   Consultations Obtained:  I requested consultation with the  ***,  and discussed lab and imaging findings as well as pertinent plan - they recommend: ***   Problem List / ED Course / Critical interventions / Medication management  Patient presents for crush injury to fingers of his left hand.  He is hemodynamically stable on arrival.  He is well-appearing on exam.  He has lacerations to the palmar aspect of his digits on his left hand, 2 3 and 4.  Third digit is oozing blood.  Patient underwent digital block of digits 2 and 3.  He soaked his hand in sterile saline.  He underwent x-ray imaging which does show distal tuft open fractures of digits 2 and 3.  He does have a penicillin allergy and describes a throat swelling that occurred when he was a child.  Clindamycin was ordered for open fracture.  Patient is up-to-date on tetanus.  Patient underwent further irrigation with sterile saline and syringe.  Wounds are hemostatic at this time.  Loose closure was performed on areas of open fracture."  Closure was performed on fourth digit, where there is no underlying fracture.  Patient was given Percocet for analgesia while in the ED.  He was prescribed additional narcotic pain medication for home and ongoing antibiotics for open fracture.  He was advised to follow-up with hand doctor as soon as possible.  Patient was discharged in stable condition. I ordered medication including ***  for ***  Reevaluation of the patient after these medicines showed that the patient {resolved/improved/worsened:23923::"improved"} I have reviewed the patients home medicines and have made adjustments as needed   Social Determinants of Health:  ***   Test / Admission - Considered:  ***   {Document critical care time when appropriate:1} {Document review of labs and clinical decision tools ie heart score, Chads2Vasc2 etc:1}  {Document  your independent review of radiology images, and any outside records:1} {Document your discussion with family members, caretakers, and with  consultants:1} {Document social determinants of health affecting pt's care:1} {Document your decision making why or why not admission, treatments were needed:1} Final Clinical Impression(s) / ED Diagnoses Final diagnoses:  None    Rx / DC Orders ED Discharge Orders     None

## 2022-07-07 NOTE — ED Triage Notes (Signed)
Pt sent here from UC due to cuts/crush injury to index, middle and ring fingers.

## 2022-07-08 ENCOUNTER — Telehealth: Payer: Self-pay | Admitting: *Deleted

## 2022-07-08 ENCOUNTER — Encounter (HOSPITAL_BASED_OUTPATIENT_CLINIC_OR_DEPARTMENT_OTHER): Payer: Self-pay

## 2022-07-08 ENCOUNTER — Other Ambulatory Visit (HOSPITAL_BASED_OUTPATIENT_CLINIC_OR_DEPARTMENT_OTHER): Payer: Self-pay | Admitting: Orthopaedic Surgery

## 2022-07-08 ENCOUNTER — Ambulatory Visit (HOSPITAL_BASED_OUTPATIENT_CLINIC_OR_DEPARTMENT_OTHER): Payer: PPO | Admitting: Student

## 2022-07-08 DIAGNOSIS — S61311A Laceration without foreign body of left index finger with damage to nail, initial encounter: Secondary | ICD-10-CM | POA: Diagnosis not present

## 2022-07-08 DIAGNOSIS — T148XXA Other injury of unspecified body region, initial encounter: Secondary | ICD-10-CM

## 2022-07-08 DIAGNOSIS — M25532 Pain in left wrist: Secondary | ICD-10-CM | POA: Diagnosis not present

## 2022-07-08 DIAGNOSIS — M79642 Pain in left hand: Secondary | ICD-10-CM | POA: Diagnosis not present

## 2022-07-08 DIAGNOSIS — S61313A Laceration without foreign body of left middle finger with damage to nail, initial encounter: Secondary | ICD-10-CM | POA: Diagnosis not present

## 2022-07-08 NOTE — Telephone Encounter (Signed)
   Pre-operative Risk Assessment    Patient Name: Brandon Maddox  DOB: [birth year] MRN: [id number]      Request for Surgical Clearance    Procedure:   LEFT INDEX AND MIDDLE FINGER IRRIGATION AND DEBRIDEMENT OF OPEN FRACTURE, REPAIR NAIL BED, POSSIBLE OPEN REDUCTION AND PERCUTANEOUS FIXATION OF DISTAL PHALANX FRACTURE, POSSIBLE REVISION AMPUTATION   Date of Surgery:  Clearance 07/12/22                                 Surgeon:  DR. CHARLES BENFIELD Surgeon's Group or Practice Name:  [facility] Phone number:  [phone] ATTN: [facility] Fax number:  [phone]   Type of Clearance Requested:   - Medical  - Pharmacy:  Hold Apixaban (Eliquis)     Type of Anesthesia:  MAC & LOCAL    Additional requests/questions:    Jiles Prows   07/08/2022, 3:51 PM

## 2022-07-08 NOTE — Progress Notes (Signed)
Called patient directly and informed him due to the complexity of his case, he should see one of the hand specialists at [facility]. Urgent referral placed to Dr. Greta Doom at [facility] and patient aware. No further questions asked.

## 2022-07-08 NOTE — Telephone Encounter (Signed)
Patient with diagnosis of atrial fibrillation on Eliquis for anticoagulation.    Procedure:   LEFT INDEX AND MIDDLE FINGER IRRIGATION AND DEBRIDEMENT OF OPEN FRACTURE, REPAIR NAIL BED, POSSIBLE OPEN REDUCTION AND PERCUTANEOUS FIXATION OF DISTAL PHALANX FRACTURE, POSSIBLE REVISION AMPUTATION    Date of Surgery:  Clearance 07/12/22                                   CHA2DS2-VASc Score = 2   This indicates a 2.2% annual risk of stroke. The patient's score is based upon: CHF History: 0 HTN History: 0 Diabetes History: 1 Stroke History: 0 Vascular Disease History: 0 Age Score: 1 Gender Score: 0   CrCl  - last in Epic/KPN from 03/2021.  Please reach out to PCP to see if they have something more current  Platelet count 213   **This guidance is not considered finalized until pre-operative APP has relayed final recommendations.**

## 2022-07-08 NOTE — Telephone Encounter (Signed)
Pt has been added for pre op tele appt; for urgent surgery Monday 07/12/22.   Tele pre op appt 07/09/22 @ 10:20. Med rec and consent are done.      Patient Consent for Virtual Visit        Brandon Maddox has provided verbal consent on 07/08/2022 for a virtual visit (video or telephone).   CONSENT FOR VIRTUAL VISIT FOR:  Brandon Maddox  By participating in this virtual visit I agree to the following:  I hereby voluntarily request, consent and authorize [facility] and its employed or contracted physicians, physician assistants, nurse practitioners or other licensed health care professionals (the Practitioner), to provide me with telemedicine health care services (the "Services") as deemed necessary by the treating Practitioner. I acknowledge and consent to receive the Services by the Practitioner via telemedicine. I understand that the telemedicine visit will involve communicating with the Practitioner through live audiovisual communication technology and the disclosure of certain medical information by electronic transmission. I acknowledge that I have been given the opportunity to request an in-person assessment or other available alternative prior to the telemedicine visit and am voluntarily participating in the telemedicine visit.  I understand that I have the right to withhold or withdraw my consent to the use of telemedicine in the course of my care at any time, without affecting my right to future care or treatment, and that the Practitioner or I may terminate the telemedicine visit at any time. I understand that I have the right to inspect all information obtained and/or recorded in the course of the telemedicine visit and may receive copies of available information for a reasonable fee.  I understand that some of the potential risks of receiving the Services via telemedicine include:  Delay or interruption in medical evaluation due to technological equipment failure or  disruption; Information transmitted may not be sufficient (e.g. poor resolution of images) to allow for appropriate medical decision making by the Practitioner; and/or  In rare instances, security protocols could fail, causing a breach of personal health information.  Furthermore, I acknowledge that it is my responsibility to provide information about my medical history, conditions and care that is complete and accurate to the best of my ability. I acknowledge that Practitioner's advice, recommendations, and/or decision may be based on factors not within their control, such as incomplete or inaccurate data provided by me or distortions of diagnostic images or specimens that may result from electronic transmissions. I understand that the practice of medicine is not an exact science and that Practitioner makes no warranties or guarantees regarding treatment outcomes. I acknowledge that a copy of this consent can be made available to me via my patient portal ([facility]), or I can request a printed copy by calling the office of [facility].    I understand that my insurance will be billed for this visit.   I have read or had this consent read to me. I understand the contents of this consent, which adequately explains the benefits and risks of the Services being provided via telemedicine.  I have been provided ample opportunity to ask questions regarding this consent and the Services and have had my questions answered to my satisfaction. I give my informed consent for the services to be provided through the use of telemedicine in my medical care

## 2022-07-08 NOTE — Telephone Encounter (Signed)
Pt has been added for pre op tele appt; for urgent surgery Monday 07/12/22.    Tele pre op appt 07/09/22 @ 10:20. Med rec and consent are done.

## 2022-07-09 ENCOUNTER — Other Ambulatory Visit: Payer: Self-pay | Admitting: *Deleted

## 2022-07-09 ENCOUNTER — Ambulatory Visit: Payer: PPO | Attending: Cardiovascular Disease

## 2022-07-09 ENCOUNTER — Other Ambulatory Visit: Payer: Self-pay | Admitting: Physician Assistant

## 2022-07-09 DIAGNOSIS — Z01810 Encounter for preprocedural cardiovascular examination: Secondary | ICD-10-CM

## 2022-07-09 NOTE — Telephone Encounter (Signed)
D/w pre op APP and Pharm-d this morning. Ok per [employer], Pharm-d to use labs on file, though pt should have updated labs next time in the office. Pre op APP will d/w the pt today.

## 2022-07-09 NOTE — Progress Notes (Unsigned)
Will place a BMP stat to attempt to get an updated creatinine prior to surgery.   Elgie Collard, PA-C

## 2022-07-09 NOTE — Progress Notes (Signed)
Virtual Visit via Telephone Note   Because of Brandon Maddox's co-morbid illnesses, he is at least at moderate risk for complications without adequate follow up.  This format is felt to be most appropriate for this patient at this time.  The patient did not have access to video technology/had technical difficulties with video requiring transitioning to audio format only (telephone).  All issues noted in this document were discussed and addressed.  No physical exam could be performed with this format.  Please refer to the patient's chart for his consent to telehealth for Brandon Maddox.  Evaluation Performed:  Preoperative cardiovascular risk assessment _____________   Date:  07/09/2022   Patient ID:  Brandon Maddox, DOB Nov 15, 1950, MRN [id number] Patient Location:  Home Provider location:   Office  Primary Care Provider:  [name], MD Primary Cardiologist:  Rozann Lesches, MD  Chief Complaint / Patient Profile   72 y.o. y/o male with a h/o GERD, atrial fibrillation, depression, OSA on CPAP, prostate cancer who is pending left index and middle finger irrigation and debridement of open fracture, repair nailbed, possible open reduction and percutaneous fixation of distal phalanx fracture, possible revision amputation and presents today for telephonic preoperative cardiovascular risk assessment.  History of Present Illness    Brandon Maddox is a 72 y.o. male who presents via audio/video conferencing for a telehealth visit today.  Pt was last seen in cardiology clinic on 01/27/2022 by Dr. Domenic Polite.  At that time Brandon Maddox was doing well .  The patient is now pending procedure as outlined above. Since his last visit, he feels okay without any cardiac symptoms.  Denies chest pain or shortness of breath.  He did have a DCCV done and was in normal sinus rhythm for a while.  However, he was at his primary about 2 months ago and was told he was back in atrial fibrillation.  He was however  rate controlled and asymptomatic.   Okay to hold Eliquis 2 days prior to the procedure and restart when medically safe to do so.  (We are working on an updated creatinine and have encouraged the patient to go to the lab today for a BMP, orders placed).    Past Medical History    Past Medical History:  Diagnosis Date   Acid reflux    Atrial fibrillation (HCC)    Closed right scapular fracture    Depression    GERD (gastroesophageal reflux disease)    Gout    Low testosterone    OSA on CPAP    Polycythemia vera ([facility]) 01/17/2017   Prostate cancer ([facility])    Ribs, multiple fractures    Wears glasses    Past Surgical History:  Procedure Laterality Date   CARDIOVERSION N/A 04/02/2021   Procedure: CARDIOVERSION;  Surgeon: Satira Sark, MD;  [facility];  Service: Cardiovascular;  Laterality: N/A;   COLONOSCOPY WITH PROPOFOL N/A 06/01/2018   Surgeon: Daneil Dolin, MD; 2 tubular adenomas, benign polyps, pancolonic diverticulosis.  Surveillance in 3 years.   COLONOSCOPY WITH PROPOFOL N/A 11/25/2021   Procedure: COLONOSCOPY WITH PROPOFOL;  Surgeon: Daneil Dolin, MD;  [facility];  Service: Endoscopy;  Laterality: N/A;  9:15am   CYSTOSCOPY N/A 06/26/2020   Procedure: CYSTOSCOPY;  Surgeon: Cleon Gustin, MD;  [facility];  Service: Urology;  Laterality: N/A;   HEMOSTASIS CLIP PLACEMENT  11/25/2021   Procedure: HEMOSTASIS CLIP PLACEMENT;  Surgeon: Daneil Dolin, MD;  [facility];  Service: Endoscopy;;   HIP SURGERY Right 2010   Dr. 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   RADIOACTIVE SEED IMPLANT N/A 06/26/2020   Procedure: RADIOACTIVE SEED IMPLANT/BRACHYTHERAPY IMPLANT;  Surgeon: Cleon Gustin, MD;  Location[facility];  Service: Urology;  Laterality: N/A;   SPACE OAR INSTILLATION N/A 06/26/2020   Procedure: SPACE OAR INSTILLATION;  Surgeon: Cleon Gustin, MD;  Location: [facility];  Service: Urology;  Laterality: N/A;    Allergies  Allergies  Allergen Reactions   Penicillins Other (See Comments)    Mother told him throat swelled up when he took at a young age  Did it involve swelling of the face/tongue/throat, SOB, or low BP? Yes Did it involve sudden or severe rash/hives, skin peeling, or any reaction on the inside of your mouth or nose? Unknown Did you need to seek medical attention at a hospital or doctor's office? Yes When did it last happen?  Childhood reaction    If all above answers are "NO", may proceed with cephalosporin use.     Home Medications    Prior to Admission medications   Medication Sig Start Date End Date Taking? Authorizing Provider  allopurinol (ZYLOPRIM) 300 MG tablet Take 300 mg by mouth in the morning. 04/29/15   [provider]  apixaban (ELIQUIS) 5 MG TABS tablet TAKE ONE TABLET BY MOUTH TWICE A DAY 04/29/22   Satira Sark, MD  buPROPion (WELLBUTRIN XL) 150 MG 24 hr tablet Take 450 mg by mouth in the morning. 08/26/15   [provider]  cholecalciferol (VITAMIN D3) 25 MCG (1000 UNIT) tablet Take 1,000 Units by mouth in the morning.    [provider]  clindamycin (CLEOCIN) 150 MG capsule Take 2 capsules (300 mg total) by mouth 3 (three) times daily for 5 days. 07/07/22 07/12/22  Godfrey Pick, MD  flecainide (TAMBOCOR) 50 MG tablet TAKE ONE TABLET (50MG  TOTAL) BY MOUTH  TWO TIMES DAILY 02/26/22   Satira Sark, MD  metoprolol succinate (TOPROL XL) 25 MG 24 hr tablet Take 1 tablet (25 mg total) by mouth daily. 08/27/15   Satira Sark, MD  omeprazole (PRILOSEC OTC) 20 MG tablet Take 20 mg by mouth in the morning. 05/09/12   [provider]  oxyCODONE-acetaminophen (PERCOCET/ROXICET) 5-325 MG tablet Take 1 tablet by mouth every 6 (six) hours as needed for severe pain. 07/07/22   Godfrey Pick, MD  silodosin (RAPAFLO) 8 MG CAPS capsule Take 1 capsule (8 mg total) by mouth at bedtime. 06/28/22   McKenzie, Candee Furbish, MD  vitamin B-12 (CYANOCOBALAMIN) 1000 MCG tablet Take 1,000 mcg by mouth every other day. In the morning.    [provider]    Physical Exam    Vital Signs:  Brandon Maddox does not have vital signs available for review today.  Given telephonic nature  of communication, physical exam is limited. AAOx3. NAD. Normal affect.  Speech and respirations are unlabored.  Accessory Clinical Findings    None  Assessment & Plan    1.  Preoperative Cardiovascular Risk Assessment:   Brandon Maddox perioperative risk of a major cardiac event is 0.4% according to the Revised Cardiac Risk Index (RCRI).  Therefore, he is at low risk for perioperative complications.   His functional capacity is good at 5.62 METs according to the Duke Activity Status Index (DASI). Recommendations: According to ACC/AHA guidelines, no further cardiovascular testing needed.  The patient may proceed to surgery at acceptable risk.   Antiplatelet and/or Anticoagulation Recommendations:  Eliquis (Apixaban) can be held for 2 days prior to surgery.  Please resume post op when felt to be safe.     The patient was advised that if he develops new symptoms prior to surgery to contact our office to arrange for a follow-up visit, and he verbalized understanding.   A copy of this note will be routed to requesting surgeon.  Time:   Today, I have spent 9 minutes with the  patient with telehealth technology discussing medical history, symptoms, and management plan.     Elgie Collard, PA-C  07/09/2022, 7:52 AM

## 2022-07-09 NOTE — Telephone Encounter (Signed)
Ok for pt to hold Eliquis for 2 days prior to procedure, ideally will be able to check labs today however do not want to delay his procedure on Monday if he is not able to obtain today.

## 2022-07-10 LAB — BASIC METABOLIC PANEL
BUN/Creatinine Ratio: 13 (ref 10–24)
BUN: 16 mg/dL (ref 8–27)
CO2: 24 mmol/L (ref 20–29)
Calcium: 9.5 mg/dL (ref 8.6–10.2)
Chloride: 99 mmol/L (ref 96–106)
Creatinine, Ser: 1.21 mg/dL (ref 0.76–1.27)
Glucose: 87 mg/dL (ref 70–99)
Potassium: 4.9 mmol/L (ref 3.5–5.2)
Sodium: 138 mmol/L (ref 134–144)
eGFR: 64 mL/min/{1.73_m2} (ref >59)

## 2022-07-12 DIAGNOSIS — S61313A Laceration without foreign body of left middle finger with damage to nail, initial encounter: Secondary | ICD-10-CM | POA: Diagnosis not present

## 2022-07-12 DIAGNOSIS — X58XXXA Exposure to other specified factors, initial encounter: Secondary | ICD-10-CM | POA: Diagnosis not present

## 2022-07-12 DIAGNOSIS — S61311A Laceration without foreign body of left index finger with damage to nail, initial encounter: Secondary | ICD-10-CM | POA: Diagnosis not present

## 2022-07-12 DIAGNOSIS — W240XXA Contact with lifting devices, not elsewhere classified, initial encounter: Secondary | ICD-10-CM | POA: Diagnosis not present

## 2022-07-12 DIAGNOSIS — S62631B Displaced fracture of distal phalanx of left index finger, initial encounter for open fracture: Secondary | ICD-10-CM | POA: Diagnosis not present

## 2022-07-12 DIAGNOSIS — S62633B Displaced fracture of distal phalanx of left middle finger, initial encounter for open fracture: Secondary | ICD-10-CM | POA: Diagnosis not present

## 2022-07-13 ENCOUNTER — Telehealth: Payer: Self-pay

## 2022-07-13 NOTE — Telephone Encounter (Signed)
     Patient  visit on 07/07/2022  at [facility] was for crushing injury of finger, left hand.  Have you been able to follow up with your primary care physician? Patient followed up with Orthopedic Surgeon.  The patient was or was not able to obtain any needed medicine or equipment. Patient was able to obtain medication.  Are there diet recommendations that you are having difficulty following? No  Patient expresses understanding of discharge instructions and education provided has no other needs at this time. Yes   [facility] Resource Care Guide   [email or web address]  ?? [phone]   Website: [email or web address]  [email or web address]

## 2022-08-12 NOTE — Addendum Note (Signed)
Addended byGustavus Messing on: 08/12/2022 08:49 AM   Modules accepted: Orders

## 2022-08-13 ENCOUNTER — Encounter: Payer: Self-pay | Admitting: Urology

## 2022-08-13 ENCOUNTER — Ambulatory Visit (INDEPENDENT_AMBULATORY_CARE_PROVIDER_SITE_OTHER): Payer: PPO | Admitting: Urology

## 2022-08-13 VITALS — BP 116/88 | HR 76

## 2022-08-13 DIAGNOSIS — N138 Other obstructive and reflux uropathy: Secondary | ICD-10-CM | POA: Diagnosis not present

## 2022-08-13 DIAGNOSIS — R351 Nocturia: Secondary | ICD-10-CM | POA: Diagnosis not present

## 2022-08-13 DIAGNOSIS — N401 Enlarged prostate with lower urinary tract symptoms: Secondary | ICD-10-CM

## 2022-08-13 DIAGNOSIS — C61 Malignant neoplasm of prostate: Secondary | ICD-10-CM | POA: Diagnosis not present

## 2022-08-13 LAB — URINALYSIS, ROUTINE W REFLEX MICROSCOPIC
Bilirubin, UA: NEGATIVE
Glucose, UA: NEGATIVE
Ketones, UA: NEGATIVE
Leukocytes,UA: NEGATIVE
Nitrite, UA: NEGATIVE
Protein,UA: NEGATIVE
RBC, UA: NEGATIVE
Specific Gravity, UA: 1.01 (ref 1.005–1.030)
Urobilinogen, Ur: 0.2 mg/dL (ref 0.2–1.0)
pH, UA: 7 (ref 5.0–7.5)

## 2022-08-13 LAB — BLADDER SCAN AMB NON-IMAGING: Scan Result: 40

## 2022-08-13 MED ORDER — MIRABEGRON ER 25 MG PO TB24: 25.0000 mg | ORAL_TABLET | Freq: Every day | ORAL | 0 refills | Status: DC

## 2022-08-13 NOTE — Patient Instructions (Signed)

## 2022-08-13 NOTE — Progress Notes (Signed)
Pt here today for bladder scan. Bladder was scanned and 40 was visualized.    Performed by Kennyth Lose, CMA

## 2022-08-13 NOTE — Progress Notes (Unsigned)
08/13/2022 10:33 AM   Brandon Maddox 13-Jul-1950 [id number]  Referring provider: Carylon Perches, MD [address] [place],  [place] [address]  No chief complaint on file.   HPI: He has urinary frequency every 60-90 minutes. He continues to have urgency with urge incontinence multiple times per day. Rapaflo failed to improve his LUTS.    PMH: Past Medical History:  Diagnosis Date   Acid reflux    Atrial fibrillation (HCC)    Closed right scapular fracture    Depression    GERD (gastroesophageal reflux disease)    Gout    Low testosterone    OSA on CPAP    Polycythemia vera (HCC) 01/17/2017   Prostate cancer (HCC)    Ribs, multiple fractures    Wears glasses     Surgical History: Past Surgical History:  Procedure Laterality Date   CARDIOVERSION N/A 04/02/2021   Procedure: CARDIOVERSION;  Surgeon: Jonelle Sidle, MD;  [facility];  Service: Cardiovascular;  Laterality: N/A;   COLONOSCOPY WITH PROPOFOL N/A 06/01/2018   Surgeon: Corbin Ade, MD; 2 tubular adenomas, benign polyps, pancolonic diverticulosis.  Surveillance in 3 years.   COLONOSCOPY WITH PROPOFOL N/A 11/25/2021   Procedure: COLONOSCOPY WITH PROPOFOL;  Surgeon: Corbin Ade, MD;  [facility];  Service: Endoscopy;  Laterality: N/A;  9:15am   CYSTOSCOPY N/A 06/26/2020   Procedure: CYSTOSCOPY;  Surgeon: Malen Gauze, MD;  Location: [facility];  Service: Urology;  Laterality: N/A;   HEMOSTASIS CLIP PLACEMENT  11/25/2021   Procedure: HEMOSTASIS CLIP PLACEMENT;  Surgeon: Corbin Ade, MD;  [facility];  Service: Endoscopy;;   HIP SURGERY Right 2010   Dr. Duke [place]via and then replacement of right hip   LACERATION REPAIR  05/03/2012   Procedure: REPAIR MULTIPLE LACERATIONS;  Surgeon: Osborn Coho, MD;  Location: [facility];  Service: ENT;  Laterality: N/A;   PENILE PROSTHESIS IMPLANT     PENILE PROSTHESIS IMPLANT  2014   POLYPECTOMY  06/01/2018    Procedure: POLYPECTOMY;  Surgeon: Corbin Ade, MD;  [facility];  Service: Endoscopy;;  cecum,hepatic flexure   POLYPECTOMY  11/25/2021   Procedure: POLYPECTOMY;  Surgeon: Corbin Ade, MD;  [facility];  Service: Endoscopy;;   Pyloric stenosis     82 weeks old   RADIOACTIVE SEED IMPLANT N/A 06/26/2020   Procedure: RADIOACTIVE SEED IMPLANT/BRACHYTHERAPY IMPLANT;  Surgeon: Malen Gauze, MD;  Location: [facility];  Service: Urology;  Laterality: N/A;   SPACE OAR INSTILLATION N/A 06/26/2020   Procedure: SPACE OAR INSTILLATION;  Surgeon: Malen Gauze, MD;  Location: [facility];  Service: Urology;  Laterality: N/A;    Home Medications:  Allergies as of 08/13/2022       Reactions   Penicillins Other (See Comments)   Mother told him throat swelled up when he took at a young age  Did it involve swelling of the face/tongue/throat, SOB, or low BP? Yes Did it involve sudden or severe rash/hives, skin peeling, or any reaction on the inside of your mouth or nose? Unknown Did you need to seek medical attention at a hospital or doctor's office? Yes When did it last happen?  Childhood reaction    If all above answers are "NO", may proceed with cephalosporin use.        Medication List        Accurate as of August 13, 2022 10:33 AM. If you  have any questions, ask your nurse or doctor.          allopurinol 300 MG tablet Commonly known as: ZYLOPRIM Take 300 mg by mouth in the morning.   buPROPion 150 MG 24 hr tablet Commonly known as: WELLBUTRIN XL Take 450 mg by mouth in the morning.   cholecalciferol 25 MCG (1000 UNIT) tablet Commonly known as: VITAMIN D3 Take 1,000 Units by mouth in the morning.   cyanocobalamin 1000 MCG tablet Commonly known as: VITAMIN B12 Take 1,000 mcg by mouth every other day. In the morning.   Eliquis 5 MG Tabs tablet Generic drug: apixaban TAKE ONE TABLET BY MOUTH TWICE A DAY    flecainide 50 MG tablet Commonly known as: TAMBOCOR TAKE ONE TABLET (50MG  TOTAL) BY MOUTH TWO TIMES DAILY   metoprolol succinate 25 MG 24 hr tablet Commonly known as: Toprol XL Take 1 tablet (25 mg total) by mouth daily.   omeprazole 20 MG tablet Commonly known as: PRILOSEC OTC Take 20 mg by mouth in the morning.   oxyCODONE-acetaminophen 5-325 MG tablet Commonly known as: PERCOCET/ROXICET Take 1 tablet by mouth every 6 (six) hours as needed for severe pain.   silodosin 8 MG Caps capsule Commonly known as: RAPAFLO Take 1 capsule (8 mg total) by mouth at bedtime.        Allergies:  Allergies  Allergen Reactions   Penicillins Other (See Comments)    Mother told him throat swelled up when he took at a young age  Did it involve swelling of the face/tongue/throat, SOB, or low BP? Yes Did it involve sudden or severe rash/hives, skin peeling, or any reaction on the inside of your mouth or nose? Unknown Did you need to seek medical attention at a hospital or doctor's office? Yes When did it last happen?  Childhood reaction    If all above answers are "NO", may proceed with cephalosporin use.     Family History: Family History  Problem Relation Age of Onset   Diabetes Mother    Breast cancer Mother    Diabetes Father    Colon cancer Neg Hx    Pancreatic cancer Neg Hx    Prostate cancer Neg Hx     Social History:  reports that he quit smoking about 19 years ago. His smoking use included cigarettes. He has a 10.00 pack-year smoking history. He has never used smokeless tobacco. He reports that he does not drink alcohol and does not use drugs.  ROS: All other review of systems were reviewed and are negative except what is noted above in HPI  Physical Exam: BP 116/88   Pulse 76   Constitutional:  Alert and oriented, No acute distress. HEENT: [place] AT, moist mucus membranes.  Trachea midline, no masses. Cardiovascular: No clubbing, cyanosis, or edema. Respiratory: Normal  respiratory effort, no increased work of breathing. GI: Abdomen is soft, nontender, nondistended, no abdominal masses GU: No CVA tenderness.  Lymph: No cervical or inguinal lymphadenopathy. Skin: No rashes, bruises or suspicious lesions. Neurologic: Grossly intact, no focal deficits, moving all 4 extremities. Psychiatric: Normal mood and affect.  Laboratory Data: Lab Results  Component Value Date   WBC 5.5 08/07/2021   HGB 13.1 08/07/2021   HCT 42.8 08/07/2021   MCV 81.1 08/07/2021   PLT 213 08/07/2021    Lab Results  Component Value Date   CREATININE 1.21 07/09/2022    No results found for: "PSA"  No results found for: "TESTOSTERONE"  Lab Results  Component Value  Date   HGBA1C 5.9 (H) 04/02/2013    Urinalysis    Component Value Date/Time   COLORURINE YELLOW 09/10/2008 0630   APPEARANCEUR Clear 06/28/2022 0905   LABSPEC 1.025 09/10/2008 0630   PHURINE 5.5 09/10/2008 0630   GLUCOSEU Negative 06/28/2022 0905   HGBUR TRACE (A) 09/10/2008 0630   BILIRUBINUR Negative 06/28/2022 0905   KETONESUR NEGATIVE 09/10/2008 0630   PROTEINUR 1+ (A) 06/28/2022 0905   PROTEINUR NEGATIVE 09/10/2008 0630   UROBILINOGEN 0.2 09/10/2008 0630   NITRITE Negative 06/28/2022 0905   NITRITE NEGATIVE 09/10/2008 0630   LEUKOCYTESUR Negative 06/28/2022 0905    Lab Results  Component Value Date   LABMICR See below: 06/28/2022   WBCUA 0-5 06/28/2022   LABEPIT >10 (A) 06/28/2022   BACTERIA None seen 06/28/2022    Pertinent Imaging: *** No results found for this or any previous visit.  No results found for this or any previous visit.  No results found for this or any previous visit.  No results found for this or any previous visit.  No results found for this or any previous visit.  No valid procedures specified. No results found for this or any previous visit.  No results found for this or any previous visit.   Assessment & Plan:    1. Prostate cancer (HCC) *** -  Urinalysis, Routine w reflex microscopic - BLADDER SCAN AMB NON-IMAGING  2. Benign prostatic hyperplasia with urinary obstruction *** - BLADDER SCAN AMB NON-IMAGING  3. Nocturia ***   No follow-ups on file.  Wilkie Aye, MD  [facility] [place]

## 2022-08-16 NOTE — Progress Notes (Unsigned)
    Cardiology Office Note  Date: 08/17/2022   ID: Brandon Maddox, DOB 01/13/1951, MRN [id number]  History of Present Illness: Brandon Maddox is a 72 y.o. male last seen in October 2023.  He is here today for a follow-up visit.  States that he thinks he may have gone back into atrial fibrillation sometime after December 2023.  Indicates not being bothered by palpitations and not with progressive weakness as described previously.  Heart rate is controlled today in the 70s on Toprol-XL 25 mg daily.  ECG shows atrial fibrillation with poor R wave progression.  We went over his medications, discussed increasing flecainide and then reevaluating to see if we either need to pursue a follow-up cardioversion and make other efforts at rhythm control versus continuing a strategy of heart rate control and anticoagulation.  He does have a severely dilated left atrium which will make rhythm control challenging.  Physical Exam: VS:  BP 126/82   Pulse 79   Ht 5' 10.5" (1.791 m)   Wt 211 lb 12.8 oz (96.1 kg)   SpO2 98%   BMI 29.96 kg/m , BMI Body mass index is 29.96 kg/m.  Wt Readings from Last 3 Encounters:  08/17/22 211 lb 12.8 oz (96.1 kg)  07/07/22 213 lb (96.6 kg)  01/27/22 214 lb (97.1 kg)    General: Patient appears comfortable at rest. HEENT: Conjunctiva and lids normal. Lungs: Clear to auscultation, nonlabored breathing at rest. Cardiac: Irregularly irregular without gallop. Extremities: No pitting edema.  ECG:  An ECG dated 04/23/2021 was personally reviewed today and demonstrated:  Sinus rhythm with low voltage.  Labwork: August 2023: Cholesterol 191, triglycerides 163, HDL 45, LDL 117, hemoglobin A1c 5.6% 07/09/2022: BUN 16; Creatinine, Ser 1.21; Potassium 4.9; Sodium 138   Other Studies Reviewed Today:  No interval cardiac testing for review today.  Assessment and Plan:  1.  Persistent atrial fibrillation with CHA2DS2-VASc score of at least 1.  Echocardiogram from November 2022  revealed LVEF 55 to 60% range with severely dilated left atrium.  He has been on flecainide for rhythm suppression.  Ischemic testing in November 2022 was low risk.  He is back in atrial fibrillation today with heart rate controlled on Toprol-XL 25 mg daily.  Also continues on Eliquis.  Plan to increase flecainide to 100 mg twice daily, follow-up ECG in 1 week.  Clinical reevaluation over the next 3 months.  We discussed other options today.  Rhythm control may be challenging with his left atrial size.  Can consider cardioversion and other efforts at rhythm control versus rate control and anticoagulation strategy depending on how he does symptomatically.  2.  OSA on CPAP.  Compliance with therapy.  Disposition:  Follow up  3 months.  Signed, Brandon Maddox, M.D., F.A.C.C. [facility]

## 2022-08-17 ENCOUNTER — Ambulatory Visit: Payer: PPO | Attending: Cardiology | Admitting: Cardiology

## 2022-08-17 ENCOUNTER — Encounter: Payer: Self-pay | Admitting: Cardiology

## 2022-08-17 VITALS — BP 126/82 | HR 79 | Ht 70.5 in | Wt 211.8 lb

## 2022-08-17 DIAGNOSIS — I4819 Other persistent atrial fibrillation: Secondary | ICD-10-CM

## 2022-08-17 DIAGNOSIS — G4733 Obstructive sleep apnea (adult) (pediatric): Secondary | ICD-10-CM

## 2022-08-17 MED ORDER — FLECAINIDE ACETATE 100 MG PO TABS: 100.0000 mg | ORAL_TABLET | Freq: Two times a day (BID) | ORAL | 3 refills | Status: DC

## 2022-08-17 NOTE — Patient Instructions (Signed)
Medication Instructions:  Your physician has recommended you make the following change in your medication:   -Increase Flecainide to 100 mg twice daily.   *If you need a refill on your cardiac medications before your next appointment, please call your pharmacy*   Lab Work: None If you have labs (blood work) drawn today and your tests are completely normal, you will receive your results only by: MyChart Message (if you have MyChart) OR A paper copy in the mail If you have any lab test that is abnormal or we need to change your treatment, we will call you to review the results.   Testing/Procedures: None   Follow-Up: At [facility], you and your health needs are our priority.  As part of our continuing mission to provide you with exceptional heart care, we have created designated Provider Care Teams.  These Care Teams include your primary Cardiologist (physician) and Advanced Practice Providers (APPs -  Physician Assistants and Nurse Practitioners) who all work together to provide you with the care you need, when you need it.  We recommend signing up for the patient portal called "MyChart".  Sign up information is provided on this After Visit Summary.  MyChart is used to connect with patients for Virtual Visits (Telemedicine).  Patients are able to view lab/test results, encounter notes, upcoming appointments, etc.  Non-urgent messages can be sent to your provider as well.   To learn more about what you can do with MyChart, go to [email or web address].    Your next appointment:   3 month(s)  Provider:   Nona Dell, MD    Other Instructions Please return in 1 week for a nurse visit- EKG.

## 2022-08-23 ENCOUNTER — Inpatient Hospital Stay: Payer: PPO | Attending: Hematology

## 2022-08-23 DIAGNOSIS — Z7901 Long term (current) use of anticoagulants: Secondary | ICD-10-CM | POA: Insufficient documentation

## 2022-08-23 DIAGNOSIS — Z79899 Other long term (current) drug therapy: Secondary | ICD-10-CM | POA: Diagnosis not present

## 2022-08-23 DIAGNOSIS — D751 Secondary polycythemia: Secondary | ICD-10-CM | POA: Diagnosis not present

## 2022-08-23 DIAGNOSIS — I4891 Unspecified atrial fibrillation: Secondary | ICD-10-CM | POA: Diagnosis not present

## 2022-08-23 DIAGNOSIS — E538 Deficiency of other specified B group vitamins: Secondary | ICD-10-CM | POA: Insufficient documentation

## 2022-08-23 DIAGNOSIS — E559 Vitamin D deficiency, unspecified: Secondary | ICD-10-CM | POA: Diagnosis not present

## 2022-08-23 DIAGNOSIS — C61 Malignant neoplasm of prostate: Secondary | ICD-10-CM | POA: Diagnosis not present

## 2022-08-23 DIAGNOSIS — D45 Polycythemia vera: Secondary | ICD-10-CM | POA: Diagnosis not present

## 2022-08-23 LAB — CBC WITH DIFFERENTIAL/PLATELET
Abs Immature Granulocytes: 0.04 10*3/uL (ref 0.00–0.07)
Basophils Absolute: 0 10*3/uL (ref 0.0–0.1)
Basophils Relative: 0 %
Eosinophils Absolute: 0.1 10*3/uL (ref 0.0–0.5)
Eosinophils Relative: 1 %
HCT: 52.6 % — ABNORMAL HIGH (ref 39.0–52.0)
Hemoglobin: 17.3 g/dL — ABNORMAL HIGH (ref 13.0–17.0)
Immature Granulocytes: 1 %
Lymphocytes Relative: 29 %
Lymphs Abs: 1.5 10*3/uL (ref 0.7–4.0)
MCH: 29.8 pg (ref 26.0–34.0)
MCHC: 32.9 g/dL (ref 30.0–36.0)
MCV: 90.5 fL (ref 80.0–100.0)
Monocytes Absolute: 0.4 10*3/uL (ref 0.1–1.0)
Monocytes Relative: 8 %
Neutro Abs: 3.2 10*3/uL (ref 1.7–7.7)
Neutrophils Relative %: 61 %
Platelets: 178 10*3/uL (ref 150–400)
RBC: 5.81 MIL/uL (ref 4.22–5.81)
RDW: 14.6 % (ref 11.5–15.5)
WBC: 5.3 10*3/uL (ref 4.0–10.5)
nRBC: 0 % (ref 0.0–0.2)

## 2022-08-23 LAB — VITAMIN D 25 HYDROXY (VIT D DEFICIENCY, FRACTURES): Vit D, 25-Hydroxy: 39.85 ng/mL (ref 30–100)

## 2022-08-23 LAB — VITAMIN B12: Vitamin B-12: 421 pg/mL (ref 180–914)

## 2022-08-24 ENCOUNTER — Ambulatory Visit: Payer: PPO | Attending: Internal Medicine | Admitting: *Deleted

## 2022-08-24 ENCOUNTER — Encounter: Payer: Self-pay | Admitting: *Deleted

## 2022-08-24 VITALS — Ht 70.5 in | Wt 211.0 lb

## 2022-08-24 DIAGNOSIS — I4819 Other persistent atrial fibrillation: Secondary | ICD-10-CM | POA: Diagnosis not present

## 2022-08-24 NOTE — Patient Instructions (Signed)
Your physician recommends that you continue on your current medications as directed. Please refer to the Current Medication list given to you today. We will contact you after your provider reviews your visit

## 2022-08-24 NOTE — Progress Notes (Unsigned)
Presents for nurse visit to have EKG per 08/17/2022 visit after increasing flecainide to 100 mg twice daily. Request for follow-up ECG in 1 week per [facility]. Denies dizziness, chest pain, SOB or palpitations. Medications reviewed. EKG done and routed to provider for review.

## 2022-08-27 ENCOUNTER — Encounter: Payer: Self-pay | Admitting: Physician Assistant

## 2022-08-27 NOTE — Progress Notes (Signed)
[place], [place], [place] [address]   CLINIC:  Medical Oncology/Hematology  PCP:  Carylon Perches, [place] [address] [place] [address] [phone]   REASON FOR VISIT:  Follow-up for erythrocytosis / secondary polycythemia  CURRENT THERAPY: Intermittent phlebotomies  INTERVAL HISTORY:   Mr. Hoerig 72 y.o. male returns for routine follow-up of secondary polycythemia.  He was last seen by Dr. Ellin Saba on 08/20/2021.  At today's visit, he reports feeling fairly well.  He had sustained normal hemoglobin and hematocrit from June 2021 through his most recent labs in May 2024, but denies any acute changes in his health that would account for his recurrent erythrocytosis.  He does report some increased fatigue and tinnitus (buzzing in his ears that he only notices at nighttime).  He denies any aquagenic pruritus, Raynaud's, or other vasomotor symptoms.  No B symptoms or symptoms concerning for splenomegaly.  He continues to take vitamin D 1000 units daily and vitamin B12 1000 mcg daily.    He has 60% energy and 100% appetite. He endorses that he is maintaining a stable weight.   ASSESSMENT & PLAN:  1.   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  WBC and platelets have been normal. - Initially thought to be secondary to testosterone supplementation, but he has been off of testosterone injections since July 2018 but continued to have erythrocytosis  - MPN testing was NEGATIVE: No evidence of JAK2 V617F, CALR, MPL, or Exons 12-15 mutations. - BCR/ABL PCR was positive for ABL1 e13a2 fusion transcript on 01/17/2017, with 0.0624% b2a2 transcript detected. Repeat BCR/ABL PCR was negative in 2019 - Bone marrow biopsy November 2018 showed slightly hypercellular bone marrow for age with trilineage hematopoiesis.  No definite morphological evidence of myeloproliferative disorder. - No family history of MPN.  No obvious carbon monoxide exposure.   He does not take any diuretic medications.  Former smoker (quit 2005).  No current testosterone supplementation. - He has atrial fibrillation, and is on Eliquis.  No other known cardiopulmonary disease. - Reports excellent compliance with CPAP machine in the setting of OSA - Last phlebotomy was on 02/17/2021. - Hemoglobin/hematocrit had normalized from June 2021 through 2023.  Recurrent erythrocytosis noted in May 2024, but no changes in his health to account for recurrent erythrocytosis. - Most recent CBC/D (08/23/2022): Hgb 17.3/hematocrit 52.6, otherwise normal CBC/differential. - He does not have any vasomotor symptoms.  No aquagenic pruritus.  No erythromelalgia. - He does report some increased fatigue with concurrent erythrocytosis. - PLAN: Recommend phlebotomy this week due to symptomatic erythrocytosis. - Labs (CBC, erythropoietin) + POSSIBLE phlebotomy (if HCT >52.0) in 3 months - CBC/D + OFFICE visit in 6 months - Consider BCR/ABL FISH if any abnormalities in WBCs or platelets, since it is likely a false positive result from PCR in 2018.  There is no evidence that BCR/ABL mutation contributes to elevated RBCs.  2.   T1c prostate cancer: - Gleason 3+3, PSA 5.1. - Status post seed implants on 06/26/2020.  3.  Vitamin D deficiency - He is taking vitamin D 1000 units daily - Most recent labs (08/23/2022): Vitamin D normal at 39.85 - PLAN: Continue vitamin D 1000 units daily   4.  Vitamin B12 deficiency - He is taking vitamin B-12 1000 mcg every other day  - Most recent labs (08/23/2022): Vitamin B12 normal at 421 - PLAN: Continue taking vitamin B12 1000 mcg every other day  PLAN SUMMARY: >> Phlebotomy in 1 to 2  weeks >> Labs (CBC, erythropoietin) + POSSIBLE phlebotomy (HCT >52.0) in 3 months >> CBC/D + OFFICE visit in 6 months      REVIEW OF SYSTEMS:   Review of Systems  Constitutional:  Positive for fatigue. Negative for appetite change, chills, diaphoresis, fever and unexpected  weight change.  HENT:   Negative for lump/mass and nosebleeds.   Eyes:  Negative for eye problems.  Respiratory:  Negative for cough, hemoptysis and shortness of breath.   Cardiovascular:  Positive for palpitations (Afib). Negative for chest pain and leg swelling.  Gastrointestinal:  Negative for abdominal pain, blood in stool, constipation, diarrhea, nausea and vomiting.  Genitourinary:  Negative for hematuria.   Skin: Negative.   Neurological:  Positive for light-headedness. Negative for dizziness and headaches.  Hematological:  Does not bruise/bleed easily.     PHYSICAL EXAM:  ECOG PERFORMANCE STATUS: 0 - Asymptomatic  There were no vitals filed for this visit. There were no vitals filed for this visit. Physical Exam Constitutional:      Appearance: Normal appearance. He is obese.  Cardiovascular:     Rate and Rhythm: Rhythm irregular.     Heart sounds: Normal heart sounds.  Pulmonary:     Breath sounds: Normal breath sounds.  Neurological:     General: No focal deficit present.     Mental Status: Mental status is at baseline.  Psychiatric:        Behavior: Behavior normal. Behavior is cooperative.    PAST MEDICAL/SURGICAL HISTORY:  Past Medical History:  Diagnosis Date   Acid reflux    Atrial fibrillation (HCC)    Closed right scapular fracture    Depression    GERD (gastroesophageal reflux disease)    Gout    Low testosterone    OSA on CPAP    Polycythemia vera (HCC) 01/17/2017   Prostate cancer (HCC)    Ribs, multiple fractures    Wears glasses    Past Surgical History:  Procedure Laterality Date   CARDIOVERSION N/A 04/02/2021   Procedure: CARDIOVERSION;  Surgeon: Jonelle Sidle, [place];  Location: [facility];  Service: Cardiovascular;  Laterality: N/A;   COLONOSCOPY WITH PROPOFOL N/A 06/01/2018   Surgeon: Corbin Ade, [place]; 2 tubular adenomas, benign polyps, pancolonic diverticulosis.  Surveillance in 3 years.   COLONOSCOPY WITH PROPOFOL N/A 11/25/2021    Procedure: COLONOSCOPY WITH PROPOFOL;  Surgeon: Corbin Ade, [place];  Location: [facility];  Service: Endoscopy;  Laterality: N/A;  9:15am   CYSTOSCOPY N/A 06/26/2020   Procedure: CYSTOSCOPY;  Surgeon: Malen Gauze, [place];  [facility];  Service: Urology;  Laterality: N/A;   HEMOSTASIS CLIP PLACEMENT  11/25/2021   Procedure: HEMOSTASIS CLIP PLACEMENT;  Surgeon: Corbin Ade, [place];  Location: [facility];  Service: Endoscopy;;   HIP SURGERY Right 2010   Dr. 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   RADIOACTIVE SEED IMPLANT  N/A 06/26/2020   Procedure: RADIOACTIVE SEED IMPLANT/BRACHYTHERAPY IMPLANT;  Surgeon: Malen Gauze, [place];  Location: [facility];  Service: Urology;  Laterality: N/A;   SPACE OAR INSTILLATION N/A 06/26/2020   Procedure: SPACE OAR INSTILLATION;  Surgeon: Malen Gauze, [place];  Location: [facility];  Service: Urology;  Laterality: N/A;    SOCIAL HISTORY:  Social History   Socioeconomic History   Marital status: Divorced    Spouse name: Not on file   Number of children: 1   Years of education: Not on file   Highest education level: Not on file  Occupational History    Employer: SELF EMPLOYED    Comment: retired  Tobacco Use   Smoking status: Former    Packs/day: 1.00    Years: 10.00    Additional pack years: 0.00    Total pack years: 10.00    Types:  Cigarettes    Quit date: 04/20/2003    Years since quitting: 19.3   Smokeless tobacco: Never  Vaping Use   Vaping Use: Never used  Substance and Sexual Activity   Alcohol use: No    Alcohol/week: 0.0 standard drinks of alcohol    Comment: History of alcohol abuse in the past; none in 7 years   Drug use: No   Sexual activity: Yes  Other Topics Concern   Not on file  Social History Narrative   Not on file   Social Determinants of Health   Financial Resource Strain: Not on file  Food Insecurity: Not on file  Transportation Needs: Not on file  Physical Activity: Not on file  Stress: Not on file  Social Connections: Not on file  Intimate Partner Violence: Not on file    FAMILY HISTORY:  Family History  Problem Relation Age of Onset   Diabetes Mother    Breast cancer Mother    Diabetes Father    Colon cancer Neg Hx    Pancreatic cancer Neg Hx    Prostate cancer Neg Hx     CURRENT MEDICATIONS:  Outpatient Encounter Medications as of 08/30/2022  Medication Sig   allopurinol (ZYLOPRIM) 300 MG tablet Take 300 mg by mouth in the morning.   apixaban (ELIQUIS) 5 MG TABS tablet TAKE ONE TABLET BY MOUTH TWICE A DAY   buPROPion (WELLBUTRIN XL) 150 MG 24 hr tablet Take 450 mg by mouth in the morning.   cholecalciferol (VITAMIN D3) 25 MCG (1000 UNIT) tablet Take 1,000 Units by mouth in the morning.   flecainide (TAMBOCOR) 100 MG tablet Take 1 tablet (100 mg total) by mouth 2 (two) times daily.   metoprolol succinate (TOPROL XL) 25 MG 24 hr tablet Take 1 tablet (25 mg total) by mouth daily.   mirabegron ER (MYRBETRIQ) 25 MG TB24 tablet Take 1 tablet (25 mg total) by mouth daily.   omeprazole (PRILOSEC OTC) 20 MG tablet Take 20 mg by mouth in the morning.   vitamin B-12 (CYANOCOBALAMIN) 1000 MCG tablet Take 1,000 mcg by mouth every other day. In the morning.   No facility-administered encounter medications on file as of 08/30/2022.    ALLERGIES:  Allergies  Allergen Reactions    Penicillins Other (See Comments)    Mother told him throat swelled up when he took at a young age  Did it involve swelling of the face/tongue/throat, SOB, or low BP? Yes Did it involve sudden or severe rash/hives, skin peeling, or any reaction on the inside of your mouth or nose? Unknown Did you  need to seek medical attention at a hospital or doctor's office? Yes When did it last happen?  Childhood reaction    If all above answers are "NO", may proceed with cephalosporin use.     LABORATORY DATA:  I have reviewed the labs as listed.  CBC    Component Value Date/Time   WBC 5.3 08/23/2022 0921   RBC 5.81 08/23/2022 0921   HGB 17.3 (H) 08/23/2022 0921   HCT 52.6 (H) 08/23/2022 0921   PLT 178 08/23/2022 0921   MCV 90.5 08/23/2022 0921   MCH 29.8 08/23/2022 0921   MCHC 32.9 08/23/2022 0921   RDW 14.6 08/23/2022 0921   LYMPHSABS 1.5 08/23/2022 0921   MONOABS 0.4 08/23/2022 0921   EOSABS 0.1 08/23/2022 0921   BASOSABS 0.0 08/23/2022 0921      Latest Ref Rng & Units 07/09/2022   11:08 AM 03/31/2021    3:20 PM 08/05/2020    1:22 PM  CMP  Glucose 70 - 99 mg/dL 87  782  956   BUN 8 - 27 mg/dL 16  22  22    Creatinine 0.76 - 1.27 mg/dL 2.13  0.86  5.78   Sodium 134 - 144 mmol/L 138  134  136   Potassium 3.5 - 5.2 mmol/L 4.9  4.7  4.1   Chloride 96 - 106 mmol/L 99  102  102   CO2 20 - 29 mmol/L 24  23  25    Calcium 8.6 - 10.2 mg/dL 9.5  9.0  9.0   Total Protein 6.5 - 8.1 g/dL   7.1   Total Bilirubin 0.3 - 1.2 mg/dL   0.4   Alkaline Phos 38 - 126 U/L   78   AST 15 - 41 U/L   16   ALT 0 - 44 U/L   16     DIAGNOSTIC IMAGING:  I have independently reviewed the relevant imaging and discussed with the patient.   WRAP UP:  All questions were answered. The patient knows to call the clinic with any problems, questions or concerns.  Medical decision making: Moderate  Time spent on visit: I spent 20 minutes counseling the patient face to face. The total time spent in the appointment  was 30 minutes and more than 50% was on counseling.  Carnella Guadalajara, PA-C  08/30/22 9:18 AM

## 2022-08-30 ENCOUNTER — Inpatient Hospital Stay (HOSPITAL_BASED_OUTPATIENT_CLINIC_OR_DEPARTMENT_OTHER): Payer: PPO | Admitting: Physician Assistant

## 2022-08-30 VITALS — BP 130/80 | HR 64 | Temp 98.3°F | Resp 18 | Ht 70.5 in | Wt 213.0 lb

## 2022-08-30 DIAGNOSIS — C61 Malignant neoplasm of prostate: Secondary | ICD-10-CM | POA: Diagnosis not present

## 2022-08-30 DIAGNOSIS — D45 Polycythemia vera: Secondary | ICD-10-CM | POA: Diagnosis not present

## 2022-08-30 NOTE — Patient Instructions (Signed)
[facility] **VISIT SUMMARY & IMPORTANT INSTRUCTIONS **   You were seen today by Rojelio Brenner PA-C for your secondary polycythemia (elevated red blood cells).   We will schedule you for phlebotomy within the next 1 to 2 weeks. We will schedule you for lab and (possible) repeat phlebotomy in 3 months. We will see you for labs and office visit in 6 months.  Continue taking your vitamin B12 and vitamin D, as before.  FOLLOW-UP APPOINTMENT: Office visit in 6 months  ** Thank you for trusting me with your healthcare!  I strive to provide all of my patients with quality care at each visit.  If you receive a survey for this visit, I would be so grateful to you for taking the time to provide feedback.  Thank you in advance!  ~ Marvena Tally                   Dr. Doreatha Massed   &   Rojelio Brenner, PA-C   - - - - - - - - - - - - - - - - - -    Thank you for choosing [facility] to provide your oncology and hematology care.  To afford each patient quality time with our provider, please arrive at least 15 minutes before your scheduled appointment time.   If you have a lab appointment with the Cancer Center please come in thru the Main Entrance and check in at the main information desk.  You need to re-schedule your appointment should you arrive 10 or more minutes late.  We strive to give you quality time with our providers, and arriving late affects you and other patients whose appointments are after yours.  Also, if you no show three or more times for appointments you may be dismissed from the clinic at the providers discretion.     Again, thank you for choosing [facility].  Our hope is that these requests will decrease the amount of time that you wait before being seen by our physicians.       _____________________________________________________________  Should you have questions after your visit to  [facility], please contact our office at [phone] and follow the prompts.  Our office hours are 8:00 a.m. and 4:30 p.m. Monday - Friday.  Please note that voicemails left after 4:00 p.m. may not be returned until the following business day.  We are closed weekends and major holidays.  You do have access to a nurse 24-7, just call the main number to the clinic [phone] and do not press any options, hold on the line and a nurse will answer the phone.    For prescription refill requests, have your pharmacy contact our office and allow 72 hours.

## 2022-08-31 ENCOUNTER — Other Ambulatory Visit: Payer: Self-pay

## 2022-08-31 DIAGNOSIS — D500 Iron deficiency anemia secondary to blood loss (chronic): Secondary | ICD-10-CM

## 2022-09-06 ENCOUNTER — Inpatient Hospital Stay: Payer: PPO

## 2022-09-06 NOTE — Progress Notes (Signed)
Unable to do phlebotomy at this time.  Rescheduled for next week.

## 2022-09-15 ENCOUNTER — Inpatient Hospital Stay: Payer: PPO

## 2022-10-11 ENCOUNTER — Ambulatory Visit: Payer: PPO | Admitting: Urology

## 2022-10-11 VITALS — BP 116/79 | HR 76

## 2022-10-11 DIAGNOSIS — R351 Nocturia: Secondary | ICD-10-CM

## 2022-10-11 DIAGNOSIS — N138 Other obstructive and reflux uropathy: Secondary | ICD-10-CM | POA: Diagnosis not present

## 2022-10-11 DIAGNOSIS — N401 Enlarged prostate with lower urinary tract symptoms: Secondary | ICD-10-CM | POA: Diagnosis not present

## 2022-10-11 LAB — URINALYSIS, ROUTINE W REFLEX MICROSCOPIC
Bilirubin, UA: NEGATIVE
Glucose, UA: NEGATIVE
Ketones, UA: NEGATIVE
Leukocytes,UA: NEGATIVE
Nitrite, UA: NEGATIVE
Protein,UA: NEGATIVE
RBC, UA: NEGATIVE
Specific Gravity, UA: 1.03 (ref 1.005–1.030)
Urobilinogen, Ur: 2 mg/dL — ABNORMAL HIGH (ref 0.2–1.0)
pH, UA: 6 (ref 5.0–7.5)

## 2022-10-11 MED ORDER — GEMTESA 75 MG PO TABS: 1.0000 | ORAL_TABLET | Freq: Every day | ORAL | 0 refills | Status: DC

## 2022-10-11 NOTE — Progress Notes (Signed)
10/11/2022 8:37 AM   [name] 03/20/1951 [id number]  Referring provider: Carylon Perches, MD [address] [place],  [place] [address]  Followup urinary frequency and nocturia   HPI: Mr Gelpi is a 72yo here for followup for urinary frequency and nocturia. Urinary frequency every 1-2 hours and nocturia 3x. Mirabegron failed to improve his LUTS. IPSS 15 QOL 5. Urine stream strong   PMH: Past Medical History:  Diagnosis Date   Acid reflux    Atrial fibrillation (HCC)    Closed right scapular fracture    Depression    GERD (gastroesophageal reflux disease)    Gout    Low testosterone    OSA on CPAP    Prostate cancer (HCC)    Ribs, multiple fractures    Wears glasses     Surgical History: Past Surgical History:  Procedure Laterality Date   CARDIOVERSION N/A 04/02/2021   Procedure: CARDIOVERSION;  Surgeon: Jonelle Sidle, MD;  Location: [facility];  Service: Cardiovascular;  Laterality: N/A;   COLONOSCOPY WITH PROPOFOL N/A 06/01/2018   Surgeon: Corbin Ade, MD; 2 tubular adenomas, benign polyps, pancolonic diverticulosis.  Surveillance in 3 years.   COLONOSCOPY WITH PROPOFOL N/A 11/25/2021   Procedure: COLONOSCOPY WITH PROPOFOL;  Surgeon: Corbin Ade, MD;  [facility]: [facility];  Service: Endoscopy;  Laterality: N/A;  9:15am   CYSTOSCOPY N/A 06/26/2020   Procedure: CYSTOSCOPY;  Surgeon: Malen Gauze, MD;  Location: [facility];  Service: Urology;  Laterality: N/A;   HEMOSTASIS CLIP PLACEMENT  11/25/2021   Procedure: HEMOSTASIS CLIP PLACEMENT;  Surgeon: Corbin Ade, MD;  [facility]: [facility];  Service: Endoscopy;;   HIP SURGERY Right 2010   Dr. Duke Salvia and then replacement of right hip   LACERATION REPAIR  05/03/2012   Procedure: REPAIR MULTIPLE LACERATIONS;  Surgeon: Osborn Coho, MD;  [facility];  Service: ENT;  Laterality: N/A;   PENILE PROSTHESIS IMPLANT     PENILE PROSTHESIS IMPLANT  2014   POLYPECTOMY   06/01/2018   Procedure: POLYPECTOMY;  Surgeon: Corbin Ade, MD;  [facility]: [facility];  Service: Endoscopy;;  cecum,hepatic flexure   POLYPECTOMY  11/25/2021   Procedure: POLYPECTOMY;  Surgeon: Corbin Ade, MD;  [facility]: [facility];  Service: Endoscopy;;   Pyloric stenosis     35 weeks old   RADIOACTIVE SEED IMPLANT N/A 06/26/2020   Procedure: RADIOACTIVE SEED IMPLANT/BRACHYTHERAPY IMPLANT;  Surgeon: Malen Gauze, MD;  Location: [facility];  Service: Urology;  Laterality: N/A;   SPACE OAR INSTILLATION N/A 06/26/2020   Procedure: SPACE OAR INSTILLATION;  Surgeon: Malen Gauze, MD;  Location: [facility];  Service: Urology;  Laterality: N/A;    Home Medications:  Allergies as of 10/11/2022       Reactions   Penicillins Other (See Comments)   Mother told him throat swelled up when he took at a young age  Did it involve swelling of the face/tongue/throat, SOB, or low BP? Yes Did it involve sudden or severe rash/hives, skin peeling, or any reaction on the inside of your mouth or nose? Unknown Did you need to seek medical attention at a hospital or doctor's office? Yes When did it last happen?  Childhood reaction    If all above answers are "NO", may proceed with cephalosporin use.        Medication List        Accurate as of October 11, 2022  8:37  AM. If you have any questions, ask your nurse or doctor.          allopurinol 300 MG tablet Commonly known as: ZYLOPRIM Take 300 mg by mouth in the morning.   buPROPion 150 MG 24 hr tablet Commonly known as: WELLBUTRIN XL Take 450 mg by mouth in the morning.   cholecalciferol 25 MCG (1000 UNIT) tablet Commonly known as: VITAMIN D3 Take 1,000 Units by mouth in the morning.   cyanocobalamin 1000 MCG tablet Commonly known as: VITAMIN B12 Take 1,000 mcg by mouth every other day. In the morning.   Eliquis 5 MG Tabs tablet Generic drug: apixaban TAKE ONE TABLET BY MOUTH TWICE A  DAY   flecainide 100 MG tablet Commonly known as: TAMBOCOR Take 1 tablet (100 mg total) by mouth 2 (two) times daily.   metoprolol succinate 25 MG 24 hr tablet Commonly known as: Toprol XL Take 1 tablet (25 mg total) by mouth daily.   mirabegron ER 25 MG Tb24 tablet Commonly known as: MYRBETRIQ Take 1 tablet (25 mg total) by mouth daily.   omeprazole 20 MG tablet Commonly known as: PRILOSEC OTC Take 20 mg by mouth in the morning.        Allergies:  Allergies  Allergen Reactions   Penicillins Other (See Comments)    Mother told him throat swelled up when he took at a young age  Did it involve swelling of the face/tongue/throat, SOB, or low BP? Yes Did it involve sudden or severe rash/hives, skin peeling, or any reaction on the inside of your mouth or nose? Unknown Did you need to seek medical attention at a hospital or doctor's office? Yes When did it last happen?  Childhood reaction    If all above answers are "NO", may proceed with cephalosporin use.     Family History: Family History  Problem Relation Age of Onset   Diabetes Mother    Breast cancer Mother    Diabetes Father    Colon cancer Neg Hx    Pancreatic cancer Neg Hx    Prostate cancer Neg Hx     Social History:  reports that he quit smoking about 19 years ago. His smoking use included cigarettes. He has a 10.00 pack-year smoking history. He has never used smokeless tobacco. He reports that he does not drink alcohol and does not use drugs.  ROS: All other review of systems were reviewed and are negative except what is noted above in HPI  Physical Exam: BP 116/79   Pulse 76   Constitutional:  Alert and oriented, No acute distress. HEENT: [place] AT, moist mucus membranes.  Trachea midline, no masses. Cardiovascular: No clubbing, cyanosis, or edema. Respiratory: Normal respiratory effort, no increased work of breathing. GI: Abdomen is soft, nontender, nondistended, no abdominal masses GU: No CVA  tenderness.  Lymph: No cervical or inguinal lymphadenopathy. Skin: No rashes, bruises or suspicious lesions. Neurologic: Grossly intact, no focal deficits, moving all 4 extremities. Psychiatric: Normal mood and affect.  Laboratory Data: Lab Results  Component Value Date   WBC 5.3 08/23/2022   HGB 17.3 (H) 08/23/2022   HCT 52.6 (H) 08/23/2022   MCV 90.5 08/23/2022   PLT 178 08/23/2022    Lab Results  Component Value Date   CREATININE 1.21 07/09/2022    No results found for: "PSA"  No results found for: "TESTOSTERONE"  Lab Results  Component Value Date   HGBA1C 5.9 (H) 04/02/2013    Urinalysis    Component Value Date/Time  COLORURINE YELLOW 09/10/2008 0630   APPEARANCEUR Clear 08/13/2022 1020   LABSPEC 1.025 09/10/2008 0630   PHURINE 5.5 09/10/2008 0630   GLUCOSEU Negative 08/13/2022 1020   HGBUR TRACE (A) 09/10/2008 0630   BILIRUBINUR Negative 08/13/2022 1020   KETONESUR NEGATIVE 09/10/2008 0630   PROTEINUR Negative 08/13/2022 1020   PROTEINUR NEGATIVE 09/10/2008 0630   UROBILINOGEN 0.2 09/10/2008 0630   NITRITE Negative 08/13/2022 1020   NITRITE NEGATIVE 09/10/2008 0630   LEUKOCYTESUR Negative 08/13/2022 1020    Lab Results  Component Value Date   LABMICR Comment 08/13/2022   WBCUA 0-5 06/28/2022   LABEPIT >10 (A) 06/28/2022   BACTERIA None seen 06/28/2022    Pertinent Imaging:  No results found for this or any previous visit.  No results found for this or any previous visit.  No results found for this or any previous visit.  No results found for this or any previous visit.  No results found for this or any previous visit.  No valid procedures specified. No results found for this or any previous visit.  No results found for this or any previous visit.   Assessment & Plan:    1. Benign prostatic hyperplasia with urinary obstruction -we will trial gemtesa 75mg  daily - Urinalysis, Routine w reflex microscopic  2. Nocturia -gemtesa 75mg   daily.    No follow-ups on file.  Wilkie Aye, MD  [facility] [place]

## 2022-10-17 ENCOUNTER — Encounter: Payer: Self-pay | Admitting: Urology

## 2022-10-17 NOTE — Patient Instructions (Signed)

## 2022-11-08 ENCOUNTER — Other Ambulatory Visit: Payer: Self-pay | Admitting: Cardiology

## 2022-11-08 DIAGNOSIS — I4819 Other persistent atrial fibrillation: Secondary | ICD-10-CM

## 2022-11-08 NOTE — Telephone Encounter (Signed)
Prescription refill request for Eliquis received. Indication: Last office visit: 08/17/22  Ival Bible MD Scr: 1.21 on 07/09/22  Epic Age: 72 Weight: 96.1kg  Based on above findings Eliquis 5mg  twice daily is the appropriate dose.  Refill approved.

## 2022-11-09 ENCOUNTER — Ambulatory Visit: Payer: PPO | Attending: Cardiology | Admitting: Cardiology

## 2022-11-09 ENCOUNTER — Telehealth: Payer: Self-pay | Admitting: *Deleted

## 2022-11-09 ENCOUNTER — Encounter: Payer: Self-pay | Admitting: Cardiology

## 2022-11-09 ENCOUNTER — Encounter: Payer: Self-pay | Admitting: *Deleted

## 2022-11-09 VITALS — BP 122/80 | HR 95 | Ht 70.0 in | Wt 211.2 lb

## 2022-11-09 DIAGNOSIS — G4733 Obstructive sleep apnea (adult) (pediatric): Secondary | ICD-10-CM | POA: Diagnosis not present

## 2022-11-09 DIAGNOSIS — I4819 Other persistent atrial fibrillation: Secondary | ICD-10-CM | POA: Diagnosis not present

## 2022-11-09 NOTE — Progress Notes (Signed)
    Cardiology Office Note  Date: 11/09/2022   ID: Brandon Maddox, DOB Oct 16, 1950, MRN [id number]  History of Present Illness: Brandon Maddox is a 72 y.o. male last seen in April.  He is here for a follow-up visit.  Reports overall decreased stamina and remains in persistent atrial fibrillation, although heart rate controlled on Toprol-XL.  No exertional chest pain, no dizziness or syncope.  We went over his medications, he reports compliance with Eliquis, no spontaneous bleeding problems.  ECG today shows atrial fibrillation with nonspecific ST changes.  Physical Exam: VS:  BP 122/80   Pulse 95   Ht 5\' 10"  (1.778 m)   Wt 211 lb 3.2 oz (95.8 kg)   SpO2 99%   BMI 30.30 kg/m , BMI Body mass index is 30.3 kg/m.  Wt Readings from Last 3 Encounters:  11/09/22 211 lb 3.2 oz (95.8 kg)  08/30/22 213 lb (96.6 kg)  08/24/22 211 lb (95.7 kg)    General: Patient appears comfortable at rest. HEENT: Conjunctiva and lids normal. Lungs: Clear to auscultation, nonlabored breathing at rest. Cardiac: Irregularly irregular, no significant murmur or gallop. Extremities: No pitting edema.  ECG:  An ECG dated 08/24/2022 was personally reviewed today and demonstrated:  Rate controlled atrial fibrillation with low voltage in the limb leads and nonspecific T wave changes.  Labwork: 07/09/2022: BUN 16; Creatinine, Ser 1.21; Potassium 4.9; Sodium 138 08/23/2022: Hemoglobin 17.3; Platelets 178   Other Studies Reviewed Today:  No interval cardiac testing for review today.  Assessment and Plan:  1.  Persistent atrial fibrillation with CHA2DS2-VASc score of 1-2.  LVEF 55 to 60% with severely dilated left atrium by echocardiogram in November 2022.  Flecainide was increased to 100 mg twice daily at last visit with plan to reassess rhythm control.  He is symptomatic in terms of decreased stamina and remains in rate controlled atrial fibrillation.  He underwent successful cardioversion in December 2022 and held  sinus rhythm for over a year.  Plan to repeat cardioversion on current dose of flecainide and otherwise continue Toprol-XL and Eliquis.  If he does not maintain sinus rhythm reasonably thereafter, we will likely plan on EP referral for discussion of alternate antiarrhythmic therapy versus ablation.  Severe left atrial enlargement will likely make rhythm control challenging however.  2.  OSA on CPAP.  Disposition:  Follow up  3 to 4 weeks after procedure.  Signed, Jonelle Sidle, M.D., F.A.C.C. [facility] at [facility]

## 2022-11-09 NOTE — Patient Instructions (Addendum)
Medication Instructions:  Your physician recommends that you continue on your current medications as directed. Please refer to the Current Medication list given to you today.  Labwork: none  Testing/Procedures: Your physician has recommended that you have a Cardioversion (DCCV). Electrical Cardioversion uses a jolt of electricity to your heart either through paddles or wired patches attached to your chest. This is a controlled, usually prescheduled, procedure. Defibrillation is done under light anesthesia in the hospital, and you usually go home the day of the procedure. This is done to get your heart back into a normal rhythm. You are not awake for the procedure. Please see the instruction sheet given to you today.  Follow-Up: Your physician recommends that you schedule a follow-up appointment in: 3-4 weeks after cardioversion  Any Other Special Instructions Will Be Listed Below (If Applicable).  If you need a refill on your cardiac medications before your next appointment, please call your pharmacy.

## 2022-11-15 ENCOUNTER — Other Ambulatory Visit: Payer: Self-pay | Admitting: Cardiology

## 2022-11-15 ENCOUNTER — Telehealth: Payer: Self-pay | Admitting: Cardiology

## 2022-11-15 DIAGNOSIS — I4819 Other persistent atrial fibrillation: Secondary | ICD-10-CM

## 2022-11-15 NOTE — Telephone Encounter (Signed)
DCCV Thursday, November 18, 2022 @1 :15 pm with Dr. Wyline Mood at [facility] Pre-Admission Wednesday, November 17, 2022 @11 :30 am at [facility]  Patient informed and verbalized understanding of plan.

## 2022-11-15 NOTE — Telephone Encounter (Signed)
Checking percert on the following patient for testing scheduled at [facility].   DCCV Thursday, November 18, 2022 @1 :15 pm with Dr. Wyline Mood at [facility]

## 2022-11-16 NOTE — Patient Instructions (Signed)
Brandon Maddox  11/16/2022     @PREFPERIOPPHARMACY @   Your procedure is scheduled on  11/18/2022.   Report to [facility] at  1130 A.M.   Call this number if you have problems the morning of surgery:  [phone]  If you experience any cold or flu symptoms such as cough, fever, chills, shortness of breath, etc. between now and your scheduled surgery, please notify us at the above number.   Remember:  Do not eat or drink after midnight.         DO NOT miss any doses of your eliquis before your procedure.     Take these medicines the morning of surgery with A SIP OF WATER                Allopurinol, wellbutrin, flecanide, omeprazole.     Do not wear jewelry, make-up or nail polish, including gel polish,  artificial nails, or any other type of covering on natural nails (fingers and  toes).  Do not wear lotions, powders, or perfumes, or deodorant.  Do not shave 48 hours prior to surgery.  Men may shave face and neck.  Do not bring valuables to the hospital.  [facility] is not responsible for any belongings or valuables.  Contacts, dentures or bridgework may not be worn into surgery.  Leave your suitcase in the car.  After surgery it may be brought to your room.  For patients admitted to the hospital, discharge time will be determined by your treatment team.  Patients discharged the day of surgery will not be allowed to drive home and must have someone with them for 24 hours.    Special instructions:   DO NOT smoke tobacco or vape for 24 hours before your procedure.  Please read over the following fact sheets that you were given. Anesthesia Post-op Instructions and Care and Recovery After Surgery      Electrical Cardioversion Electrical cardioversion is the delivery of a jolt of electricity to restore a normal rhythm to the heart. A rhythm that is too fast or is not regular (arrhythmia) keeps the heart from pumping blood well. There is also another type of  cardioversion called a chemical (pharmacologic) cardioversion. This is when your health care provider gives you one or more medicines to bring back your regular heart rhythm. Electrical cardioversion is done as a scheduled procedure for arrhythmiasthat are not life-threatening. Electrical cardioversion may also be done in an emergency for sudden life-threatening arrhythmias. Tell a health care provider about: Any allergies you have. All medicines you are taking, including vitamins, herbs, eye drops, creams, and over-the-counter medicines. Any problems you or family members have had with sedatives or anesthesia. Any bleeding problems you have. Any surgeries you have had, including a pacemaker, defibrillator, or other implanted device. Any medical conditions you have. Whether you are pregnant or may be pregnant. What are the risks? Your provider will talk with you about risks. These include: Allergic reactions to medicines. Irritation to the skin on your chest or back where the sticky pads (electrodes) or paddles were put during electrical cardioversion. A blood clot that breaks free and travels to other parts of your body, such as your brain. Return of a worse abnormal heart rhythm that will need to be treated with medicines, a pacemaker, or an implantable cardioverter defibrillator (ICD). What happens before the procedure? Medicines Your provider may give you: Blood-thinning medicines (anticoagulants) so your blood does not clot as easily.  If your provider gives you this medicine, you may need to take it for 4 weeks before the procedure. Medicines to help stabilize your heart rate and rhythm. Ask your provider about: Changing or stopping your regular medicines. These include any diabetes medicines or blood thinners you take. Taking medicines such as aspirin and ibuprofen. These medicines can thin your blood. Do not take them unless your provider tells you to. Taking over-the-counter  medicines, vitamins, herbs, and supplements. General instructions Follow instructions from your provider about what you may eat and drink. Do not put any lotions, powders, or ointments on your chest and back for 24 hours before the procedure. They can cause problems with the electrodes or paddles used to deliver electricity to your heart. Do not wear jewelry as this can interfere with delivering electricity to your heart. If you will be going home right after the procedure, plan to have a responsible adult: Take you home from the hospital or clinic. You will not be allowed to drive. Care for you for the time you are told. Tests You may have an exam or testing. This may include: Blood labs. A transesophageal echocardiogram (TEE). What happens during the procedure?     An IV will be inserted into one of your veins. You will be given a sedative. This helps you relax. Electrodes or metal paddles will be placed on your chest. They may be placed in one of these ways: One placed on your right chest, the other on the left ribs. One placed on your chest and the other on your back. An electrical shock will be delivered. The shock briefly stops (resets) your heart rhythm. Your provider will check to see if your heart rhythm is now normal. Some people need only one shock. Some need more to restore a normal heart rhythm. The procedure may vary among providers and hospitals. What happens after the procedure? Your blood pressure, heart rate, breathing rate, and blood oxygen level will be monitored until you leave the hospital or clinic. Your heart rhythm will be watched to make sure it does not change. This information is not intended to replace advice given to you by your health care provider. Make sure you discuss any questions you have with your health care provider. Document Revised: 11/26/2021 Document Reviewed: 11/26/2021 Elsevier Patient Education  2024 [employer] Monitored Anesthesia Care,  Care After The following information offers guidance on how to care for yourself after your procedure. Your health care provider may also give you more specific instructions. If you have problems or questions, contact your health care provider. What can I expect after the procedure? After the procedure, it is common to have: Tiredness. Little or no memory about what happened during or after the procedure. Impaired judgment when it comes to making decisions. Nausea or vomiting. Some trouble with balance. Follow these instructions at home: For the time period you were told by your health care provider:  Rest. Do not participate in activities where you could fall or become injured. Do not drive or use machinery. Do not drink alcohol. Do not take sleeping pills or medicines that cause drowsiness. Do not make important decisions or sign legal documents. Do not take care of children on your own. Medicines Take over-the-counter and prescription medicines only as told by your health care provider. If you were prescribed antibiotics, take them as told by your health care provider. Do not stop using the antibiotic even if you start to feel better. Eating and drinking Follow  instructions from your health care provider about what you may eat and drink. Drink enough fluid to keep your urine pale yellow. If you vomit: Drink clear fluids slowly and in small amounts as you are able. Clear fluids include water, ice chips, low-calorie sports drinks, and fruit juice that has water added to it (diluted fruit juice). Eat light and bland foods in small amounts as you are able. These foods include bananas, applesauce, rice, lean meats, toast, and crackers. General instructions  Have a responsible adult stay with you for the time you are told. It is important to have someone help care for you until you are awake and alert. If you have sleep apnea, surgery and some medicines can increase your risk for  breathing problems. Follow instructions from your health care provider about wearing your sleep device: When you are sleeping. This includes during daytime naps. While taking prescription pain medicines, sleeping medicines, or medicines that make you drowsy. Do not use any products that contain nicotine or tobacco. These products include cigarettes, chewing tobacco, and vaping devices, such as e-cigarettes. If you need help quitting, ask your health care provider. Contact a health care provider if: You feel nauseous or vomit every time you eat or drink. You feel light-headed. You are still sleepy or having trouble with balance after 24 hours. You get a rash. You have a fever. You have redness or swelling around the IV site. Get help right away if: You have trouble breathing. You have new confusion after you get home. These symptoms may be an emergency. Get help right away. Call 911. Do not wait to see if the symptoms will go away. Do not drive yourself to the hospital. This information is not intended to replace advice given to you by your health care provider. Make sure you discuss any questions you have with your health care provider. Document Revised: 08/31/2021 Document Reviewed: 08/31/2021 Elsevier Patient Education  2024 [employer].

## 2022-11-17 ENCOUNTER — Encounter (HOSPITAL_COMMUNITY)
Admission: RE | Admit: 2022-11-17 | Discharge: 2022-11-17 | Disposition: A | Discharge status: Home / Self Care | Payer: PPO | Source: Ambulatory Visit | Attending: Cardiology | Admitting: Cardiology

## 2022-11-17 ENCOUNTER — Encounter (HOSPITAL_COMMUNITY): Payer: Self-pay

## 2022-11-17 DIAGNOSIS — I4819 Other persistent atrial fibrillation: Secondary | ICD-10-CM | POA: Diagnosis not present

## 2022-11-17 DIAGNOSIS — Z01812 Encounter for preprocedural laboratory examination: Secondary | ICD-10-CM | POA: Insufficient documentation

## 2022-11-17 HISTORY — DX: Prediabetes: R73.03

## 2022-11-17 HISTORY — DX: Cardiac arrhythmia, unspecified: I49.9

## 2022-11-17 LAB — CBC
HCT: 51.4 % (ref 39.0–52.0)
Hemoglobin: 17 g/dL (ref 13.0–17.0)
MCH: 29.7 pg (ref 26.0–34.0)
MCHC: 33.1 g/dL (ref 30.0–36.0)
MCV: 89.9 fL (ref 80.0–100.0)
Platelets: 166 K/uL (ref 150–400)
RBC: 5.72 MIL/uL (ref 4.22–5.81)
RDW: 14.1 % (ref 11.5–15.5)
WBC: 6.3 K/uL (ref 4.0–10.5)
nRBC: 0 % (ref 0.0–0.2)

## 2022-11-17 LAB — BASIC METABOLIC PANEL WITH GFR
Anion gap: 9 (ref 5–15)
BUN: 22 mg/dL (ref 8–23)
CO2: 23 mmol/L (ref 22–32)
Calcium: 9.1 mg/dL (ref 8.9–10.3)
Chloride: 104 mmol/L (ref 98–111)
Creatinine, Ser: 1.24 mg/dL (ref 0.61–1.24)
GFR, Estimated: >60 mL/min
Glucose, Bld: 108 mg/dL — ABNORMAL HIGH (ref 70–99)
Potassium: 4.3 mmol/L (ref 3.5–5.1)
Sodium: 136 mmol/L (ref 135–145)

## 2022-11-18 ENCOUNTER — Encounter (HOSPITAL_COMMUNITY): Payer: Self-pay | Admitting: Cardiology

## 2022-11-18 ENCOUNTER — Other Ambulatory Visit: Payer: Self-pay

## 2022-11-18 ENCOUNTER — Encounter (HOSPITAL_COMMUNITY)
Admission: RE | Disposition: A | Discharge status: Home / Self Care | Payer: Self-pay | Source: Home / Self Care | Attending: Cardiology

## 2022-11-18 ENCOUNTER — Ambulatory Visit (HOSPITAL_COMMUNITY)
Admission: RE | Admit: 2022-11-18 | Discharge: 2022-11-18 | Disposition: A | Discharge status: Home / Self Care | Payer: PPO | Attending: Cardiology | Admitting: Cardiology

## 2022-11-18 ENCOUNTER — Ambulatory Visit (HOSPITAL_COMMUNITY): Payer: PPO | Admitting: Certified Registered"

## 2022-11-18 ENCOUNTER — Ambulatory Visit (HOSPITAL_BASED_OUTPATIENT_CLINIC_OR_DEPARTMENT_OTHER): Payer: PPO | Admitting: Certified Registered"

## 2022-11-18 DIAGNOSIS — E119 Type 2 diabetes mellitus without complications: Secondary | ICD-10-CM

## 2022-11-18 DIAGNOSIS — G4733 Obstructive sleep apnea (adult) (pediatric): Secondary | ICD-10-CM | POA: Insufficient documentation

## 2022-11-18 DIAGNOSIS — Z87891 Personal history of nicotine dependence: Secondary | ICD-10-CM | POA: Insufficient documentation

## 2022-11-18 DIAGNOSIS — I4819 Other persistent atrial fibrillation: Secondary | ICD-10-CM | POA: Insufficient documentation

## 2022-11-18 DIAGNOSIS — G4730 Sleep apnea, unspecified: Secondary | ICD-10-CM | POA: Diagnosis not present

## 2022-11-18 DIAGNOSIS — I517 Cardiomegaly: Secondary | ICD-10-CM | POA: Diagnosis not present

## 2022-11-18 DIAGNOSIS — Z79899 Other long term (current) drug therapy: Secondary | ICD-10-CM | POA: Diagnosis not present

## 2022-11-18 DIAGNOSIS — Z7901 Long term (current) use of anticoagulants: Secondary | ICD-10-CM | POA: Insufficient documentation

## 2022-11-18 DIAGNOSIS — F32A Depression, unspecified: Secondary | ICD-10-CM | POA: Diagnosis not present

## 2022-11-18 DIAGNOSIS — I4891 Unspecified atrial fibrillation: Secondary | ICD-10-CM

## 2022-11-18 HISTORY — PX: CARDIOVERSION: SHX1299

## 2022-11-18 LAB — GLUCOSE, CAPILLARY: Glucose-Capillary: 106 mg/dL — ABNORMAL HIGH (ref 70–99)

## 2022-11-18 SURGERY — CARDIOVERSION
Anesthesia: General

## 2022-11-18 MED ORDER — PROPOFOL 10 MG/ML IV BOLUS
INTRAVENOUS | Status: DC | PRN
Start: 2022-11-18 — End: 2022-11-18
  Administered 2022-11-18: 90 mg via INTRAVENOUS

## 2022-11-18 MED ORDER — PHENYLEPHRINE 80 MCG/ML (10ML) SYRINGE FOR IV PUSH (FOR BLOOD PRESSURE SUPPORT)
PREFILLED_SYRINGE | INTRAVENOUS | Status: DC | PRN
  Administered 2022-11-18: 160 ug via INTRAVENOUS

## 2022-11-18 MED ORDER — PHENYLEPHRINE 80 MCG/ML (10ML) SYRINGE FOR IV PUSH (FOR BLOOD PRESSURE SUPPORT)
PREFILLED_SYRINGE | INTRAVENOUS | Status: AC
  Filled 2022-11-18: qty 10

## 2022-11-18 MED ORDER — SODIUM CHLORIDE 0.9 % IV SOLN: INTRAVENOUS | Status: DC

## 2022-11-18 MED ORDER — LACTATED RINGERS IV SOLN: INTRAVENOUS | Status: DC

## 2022-11-18 NOTE — CV Procedure (Signed)
CV Procedure Note  Procedure: Electrical cardioversion Physician: Dr Dina Rich MD Indication: persistent afib  Patient presented to the procedure suite after appropriate consent was obtained. I confirmed patient had not missed any doses of anticoagulation within the last 3 weeks Defib pads placed in the anterior and posterior positions. Sedation achieved with the assistance of anesthesiology, for details please refer to there note. Succesfully converted from afib to normal sinus rhythm with a single synchronized shock. Cardiopulmonary monitoring performed throughout the procedure, he tolerated well without complications   Brandon Ferry MD

## 2022-11-18 NOTE — Anesthesia Preprocedure Evaluation (Signed)
Anesthesia Evaluation  Patient identified by MRN, date of birth, ID band Patient awake    Reviewed: Allergy & Precautions, H&P , NPO status , Patient's Chart, lab work & pertinent test results, reviewed documented beta blocker date and time   Airway Mallampati: II  TM Distance: >3 FB Neck ROM: full    Dental no notable dental hx.    Pulmonary neg pulmonary ROS, sleep apnea , former smoker   Pulmonary exam normal breath sounds clear to auscultation       Cardiovascular Exercise Tolerance: Good negative cardio ROS + dysrhythmias Atrial Fibrillation  Rhythm:irregular Rate:Normal     Neuro/Psych  PSYCHIATRIC DISORDERS  Depression     Neuromuscular disease negative neurological ROS  negative psych ROS   GI/Hepatic negative GI ROS, Neg liver ROS,GERD  ,,  Endo/Other  negative endocrine ROSdiabetes    Renal/GU negative Renal ROS  negative genitourinary   Musculoskeletal   Abdominal   Peds  Hematology negative hematology ROS (+)   Anesthesia Other Findings   Reproductive/Obstetrics negative OB ROS                             Anesthesia Physical Anesthesia Plan  ASA: 3  Anesthesia Plan: General   Post-op Pain Management:    Induction:   PONV Risk Score and Plan: Propofol infusion  Airway Management Planned:   Additional Equipment:   Intra-op Plan:   Post-operative Plan:   Informed Consent: I have reviewed the patients History and Physical, chart, labs and discussed the procedure including the risks, benefits and alternatives for the proposed anesthesia with the patient or authorized representative who has indicated his/her understanding and acceptance.     Dental Advisory Given  Plan Discussed with: CRNA  Anesthesia Plan Comments:        Anesthesia Quick Evaluation

## 2022-11-18 NOTE — H&P (Signed)
Procedure H&P  Patient presents for outpatient electrical cardioversion for persistent atrial fibrillation. He has been on eliquis at home for anticoagulation. Also on flecanide and toprol. Plan for electrical cardioversion today with the assistance of anesthesiology. For complete medical history please refer to referenced clinic note below  Dina Rich MD            Cardiology Office Note   Date: 11/09/2022    ID: Brandon Maddox, DOB [birth year], MRN [id number]   History of Present Illness: Brandon Maddox is a 72 y.o. male last seen in April.  He is here for a follow-up visit.  Reports overall decreased stamina and remains in persistent atrial fibrillation, although heart rate controlled on Toprol-XL.  No exertional chest pain, no dizziness or syncope.   We went over his medications, he reports compliance with Eliquis, no spontaneous bleeding problems.   ECG today shows atrial fibrillation with nonspecific ST changes.   Physical Exam: VS:  BP 122/80   Pulse 95   Ht 5\' 10"  (1.778 m)   Wt 211 lb 3.2 oz (95.8 kg)   SpO2 99%   BMI 30.30 kg/m , BMI Body mass index is 30.3 kg/m.      Wt Readings from Last 3 Encounters:  11/09/22 211 lb 3.2 oz (95.8 kg)  08/30/22 213 lb (96.6 kg)  08/24/22 211 lb (95.7 kg)    General: Patient appears comfortable at rest. HEENT: Conjunctiva and lids normal. Lungs: Clear to auscultation, nonlabored breathing at rest. Cardiac: Irregularly irregular, no significant murmur or gallop. Extremities: No pitting edema.   ECG:  An ECG dated 08/24/2022 was personally reviewed today and demonstrated:  Rate controlled atrial fibrillation with low voltage in the limb leads and nonspecific T wave changes.   Labwork: 07/09/2022: BUN 16; Creatinine, Ser 1.21; Potassium 4.9; Sodium 138 08/23/2022: Hemoglobin 17.3; Platelets 178    Other Studies Reviewed Today:   No interval cardiac testing for review today.   Assessment and Plan:   1.  Persistent  atrial fibrillation with CHA2DS2-VASc score of 1-2.  LVEF 55 to 60% with severely dilated left atrium by echocardiogram in November 2022.  Flecainide was increased to 100 mg twice daily at last visit with plan to reassess rhythm control.  He is symptomatic in terms of decreased stamina and remains in rate controlled atrial fibrillation.  He underwent successful cardioversion in December 2022 and held sinus rhythm for over a year.  Plan to repeat cardioversion on current dose of flecainide and otherwise continue Toprol-XL and Eliquis.  If he does not maintain sinus rhythm reasonably thereafter, we will likely plan on EP referral for discussion of alternate antiarrhythmic therapy versus ablation.  Severe left atrial enlargement will likely make rhythm control challenging however.   2.  OSA on CPAP.   Disposition:  Follow up  3 to 4 weeks after procedure.   Signed, Brandon Maddox, M.D., F.A.C.C. [facility]

## 2022-11-18 NOTE — Progress Notes (Signed)
Electrical Cardioversion Procedure Note SAMBHAV CARMENATE [id number] [birth year]  Procedure: Electrical Cardioversion Indications:  Atrial Fibrillation  Procedure Details Consent: Risks of procedure as well as the alternatives and risks of each were explained to the (patient/caregiver).  Consent for procedure obtained. Time Out: Verified patient identification, verified procedure, site/side was marked, verified correct patient position, special equipment/implants available, medications/allergies/relevent history reviewed, required imaging and test results available.  Performed at 1249 11/18/2022. Present during Time Out: Dr.Branch, Sallee Provencal, Belinda CRNA.   Patient placed on cardiac monitor, pulse oximetry, supplemental oxygen as necessary.  Sedation given: Propofol given by [facility] CRNA Pacer pads placed anterior and posterior chest. Pad placement confirmed by Dr.Branch   Cardioverted 1 time(s).  Cardioverted at 200J.  Evaluation Findings: Post procedure EKG shows: NSR Complications: None Patient did tolerate procedure well.   Sherren Kerns 11/18/2022, 1:03 PM

## 2022-11-18 NOTE — Transfer of Care (Signed)
Immediate Anesthesia Transfer of Care Note  Patient: Brandon Maddox  Procedure(s) Performed: CARDIOVERSION  Patient Location: PACU  Anesthesia Type:General  Level of Consciousness: awake and patient cooperative  Airway & Oxygen Therapy: Patient Spontanous Breathing and Patient connected to nasal cannula oxygen  Post-op Assessment: Report given to RN and Post -op Vital signs reviewed and stable  Post vital signs: Reviewed and stable  Last Vitals:  Vitals Value Taken Time  BP 100/65 11/18/22     1301  Temp    Pulse 56 08/01/024   1301  Resp 17 11/18/22     1301  SpO2 97% 11/18/22     1301    Last Pain:  Vitals:   11/18/22 1147  TempSrc: Oral  PainSc: 0-No pain         Complications: No notable events documented.

## 2022-11-19 ENCOUNTER — Encounter (HOSPITAL_COMMUNITY): Payer: Self-pay | Admitting: Cardiology

## 2022-11-19 NOTE — Anesthesia Postprocedure Evaluation (Signed)
Anesthesia Post Note  Patient: Brandon Maddox  Procedure(s) Performed: CARDIOVERSION  Patient location during evaluation: Phase II Anesthesia Type: General Level of consciousness: awake Pain management: pain level controlled Vital Signs Assessment: post-procedure vital signs reviewed and stable Respiratory status: spontaneous breathing and respiratory function stable Cardiovascular status: blood pressure returned to baseline and stable Postop Assessment: no headache and no apparent nausea or vomiting Anesthetic complications: no Comments: Late entry   No notable events documented.   Last Vitals:  Vitals:   11/18/22 1400 11/18/22 1406  BP: 132/74 127/85  Pulse: 68 62  Resp: 17 16  Temp:  36.5 C  SpO2: 96% 98%    Last Pain:  Vitals:   11/19/22 1027  TempSrc:   PainSc: 0-No pain                 Windell Norfolk

## 2022-11-26 NOTE — Addendum Note (Signed)
Addendum  created 11/26/22 1322 by Oletha Cruel, CRNA   Intraprocedure Staff edited

## 2022-12-06 ENCOUNTER — Inpatient Hospital Stay: Payer: PPO | Attending: Hematology

## 2022-12-06 ENCOUNTER — Inpatient Hospital Stay: Payer: PPO

## 2022-12-06 VITALS — BP 113/71 | HR 63 | Temp 97.7°F | Resp 18

## 2022-12-06 DIAGNOSIS — D500 Iron deficiency anemia secondary to blood loss (chronic): Secondary | ICD-10-CM

## 2022-12-06 DIAGNOSIS — D751 Secondary polycythemia: Secondary | ICD-10-CM | POA: Diagnosis not present

## 2022-12-06 DIAGNOSIS — Z79899 Other long term (current) drug therapy: Secondary | ICD-10-CM | POA: Diagnosis not present

## 2022-12-06 LAB — CBC
HCT: 54.9 % — ABNORMAL HIGH (ref 39.0–52.0)
Hemoglobin: 17.9 g/dL — ABNORMAL HIGH (ref 13.0–17.0)
MCH: 29.6 pg (ref 26.0–34.0)
MCHC: 32.6 g/dL (ref 30.0–36.0)
MCV: 90.9 fL (ref 80.0–100.0)
Platelets: 186 10*3/uL (ref 150–400)
RBC: 6.04 MIL/uL — ABNORMAL HIGH (ref 4.22–5.81)
RDW: 14 % (ref 11.5–15.5)
WBC: 7 10*3/uL (ref 4.0–10.5)
nRBC: 0 % (ref 0.0–0.2)

## 2022-12-06 NOTE — Progress Notes (Signed)
Patient presents today for therapeutic phlebotomy.  Patient is in satisfactory condition with no new complaints voiced.  Vital signs are stable.  We will proceed with phlebotomy per provider orders.  Therapeutic phlebotomy started at 1540 and ended at 1550 .180 mL of blood removed from left arm. Vital signs remained stable.  Patient denies dizziness or lightheadedness.  Coban and gauze applied to site. Patient did not waited the recommended 30 minute post phlebotomy wait time.  Patient left ambulatory in stable condition.

## 2022-12-06 NOTE — Patient Instructions (Signed)
[facility]  Discharge Instructions: Thank you for choosing [facility] to provide your oncology and hematology care.  If you have a lab appointment with the Cancer Center - please note that after April 8th, 2024, all labs will be drawn in the cancer center.  You do not have to check in or register with the main entrance as you have in the past but will complete your check-in in the cancer center.  Wear comfortable clothing and clothing appropriate for easy access to any Portacath or PICC line.   We strive to give you quality time with your provider. You may need to reschedule your appointment if you arrive late (15 or more minutes).  Arriving late affects you and other patients whose appointments are after yours.  Also, if you miss three or more appointments without notifying the office, you may be dismissed from the clinic at the provider's discretion.      For prescription refill requests, have your pharmacy contact our office and allow 72 hours for refills to be completed.    Today you received therapeutic phlebotomy      BELOW ARE SYMPTOMS THAT SHOULD BE REPORTED IMMEDIATELY: *FEVER GREATER THAN 100.4 F (38 C) OR HIGHER *CHILLS OR SWEATING *NAUSEA AND VOMITING THAT IS NOT CONTROLLED WITH YOUR NAUSEA MEDICATION *UNUSUAL SHORTNESS OF BREATH *UNUSUAL BRUISING OR BLEEDING *URINARY PROBLEMS (pain or burning when urinating, or frequent urination) *BOWEL PROBLEMS (unusual diarrhea, constipation, pain near the anus) TENDERNESS IN MOUTH AND THROAT WITH OR WITHOUT PRESENCE OF ULCERS (sore throat, sores in mouth, or a toothache) UNUSUAL RASH, SWELLING OR PAIN  UNUSUAL VAGINAL DISCHARGE OR ITCHING   Items with * indicate a potential emergency and should be followed up as soon as possible or go to the Emergency Department if any problems should occur.  Please show the CHEMOTHERAPY ALERT CARD or IMMUNOTHERAPY ALERT CARD at check-in to the Emergency Department  and triage nurse.  Should you have questions after your visit or need to cancel or reschedule your appointment, please contact [facility] [phone]  and follow the prompts.  Office hours are 8:00 a.m. to 4:30 p.m. Monday - Friday. Please note that voicemails left after 4:00 p.m. may not be returned until the following business day.  We are closed weekends and major holidays. You have access to a nurse at all times for urgent questions. Please call the main number to the clinic [phone] and follow the prompts.  For any non-urgent questions, you may also contact your provider using MyChart. We now offer e-Visits for anyone 18 and older to request care online for non-urgent symptoms. For details visit [email or web address].   Also download the MyChart app! Go to the app store, search "MyChart", open the app, select [facility], and log in with your MyChart username and password.   

## 2022-12-07 LAB — ERYTHROPOIETIN: Erythropoietin: 15.6 m[IU]/mL (ref 2.6–18.5)

## 2022-12-08 ENCOUNTER — Ambulatory Visit: Payer: PPO | Admitting: Nurse Practitioner

## 2022-12-13 ENCOUNTER — Ambulatory Visit: Payer: PPO | Admitting: Urology

## 2022-12-13 VITALS — BP 126/81 | HR 73

## 2022-12-13 DIAGNOSIS — R351 Nocturia: Secondary | ICD-10-CM | POA: Diagnosis not present

## 2022-12-13 DIAGNOSIS — R350 Frequency of micturition: Secondary | ICD-10-CM

## 2022-12-13 DIAGNOSIS — C61 Malignant neoplasm of prostate: Secondary | ICD-10-CM

## 2022-12-13 LAB — URINALYSIS, ROUTINE W REFLEX MICROSCOPIC
Bilirubin, UA: NEGATIVE
Glucose, UA: NEGATIVE
Ketones, UA: NEGATIVE
Leukocytes,UA: NEGATIVE
Nitrite, UA: NEGATIVE
Protein,UA: NEGATIVE
RBC, UA: NEGATIVE
Specific Gravity, UA: 1.03 (ref 1.005–1.030)
Urobilinogen, Ur: 1 mg/dL (ref 0.2–1.0)
pH, UA: 6 (ref 5.0–7.5)

## 2022-12-13 NOTE — Patient Instructions (Signed)

## 2022-12-13 NOTE — Progress Notes (Signed)
12/13/2022 9:29 AM   Brandon Maddox 07-30-1950 [id number]  Referring provider: Carylon Perches, MD [address] [place],  [place] [address]  Followup urinary frequency and prostate cancer   HPI: [name] is a 72yo here for followup for urinary frequency and prostate cancer. No recent PSA. He tried [place] for his urinary frequency and nocturia which failed to improve his nocturia and urinary frequency. IPSS 15 QOL 4.    PMH: Past Medical History:  Diagnosis Date   Acid reflux    Atrial fibrillation (HCC)    Closed right scapular fracture    Depression    Dysrhythmia    GERD (gastroesophageal reflux disease)    Gout    Low testosterone    OSA on CPAP    Pre-diabetes    Prostate cancer (HCC)    Ribs, multiple fractures    Wears glasses     Surgical History: Past Surgical History:  Procedure Laterality Date   CARDIOVERSION N/A 04/02/2021   Procedure: CARDIOVERSION;  Surgeon: Jonelle Sidle, MD;  Location: [facility];  Service: Cardiovascular;  Laterality: N/A;   CARDIOVERSION N/A 11/18/2022   Procedure: CARDIOVERSION;  Surgeon: Antoine Poche, MD;  Location: [facility];  Service: Endoscopy;  Laterality: N/A;   COLONOSCOPY WITH PROPOFOL N/A 06/01/2018   Surgeon: Corbin Ade, MD; 2 tubular adenomas, benign polyps, pancolonic diverticulosis.  Surveillance in 3 years.   COLONOSCOPY WITH PROPOFOL N/A 11/25/2021   Procedure: COLONOSCOPY WITH PROPOFOL;  Surgeon: Corbin Ade, MD;  [facility]: [facility];  Service: Endoscopy;  Laterality: N/A;  9:15am   CYSTOSCOPY N/A 06/26/2020   Procedure: CYSTOSCOPY;  Surgeon: Malen Gauze, MD;  Location: [facility];  Service: Urology;  Laterality: N/A;   HEMOSTASIS CLIP PLACEMENT  11/25/2021   Procedure: HEMOSTASIS CLIP PLACEMENT;  Surgeon: Corbin Ade, MD;  [facility];  Service: Endoscopy;;   HIP SURGERY Right 2010   Dr. 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   RADIOACTIVE SEED IMPLANT N/A 06/26/2020   Procedure: RADIOACTIVE SEED IMPLANT/BRACHYTHERAPY IMPLANT;  Surgeon: Malen Gauze, MD;  Location: [facility];  Service: Urology;  Laterality: N/A;   SPACE OAR INSTILLATION N/A 06/26/2020   Procedure: SPACE OAR INSTILLATION;  Surgeon: Malen Gauze, MD;  Location: [facility];  Service: Urology;  Laterality: N/A;    Home Medications:  Allergies as of 12/13/2022       Reactions   Penicillins Other (See Comments)   Mother told him throat swelled up when he took at a young age  Did it involve swelling of the face/tongue/throat, SOB, or low BP? Yes Did it involve sudden or severe rash/hives, skin peeling, or any reaction on the inside of your mouth or nose? Unknown Did you need to seek medical attention at a hospital or doctor's office? Yes When did it last happen?  Childhood reaction    If all above answers are "NO", may proceed with cephalosporin use.        Medication List        Accurate as of December 13, 2022  9:29 AM. If you have any questions, ask your nurse or doctor.          allopurinol 300 MG tablet Commonly known as: ZYLOPRIM Take 300 mg by mouth in the morning.   buPROPion 150 MG 24 hr tablet Commonly known as: WELLBUTRIN XL Take 150 mg by mouth in the morning, at noon, and at bedtime.   cholecalciferol 25 MCG (1000 UNIT) tablet Commonly known as: VITAMIN D3 Take  1,000 Units by mouth in the morning.   cyanocobalamin 1000 MCG tablet Commonly known as: VITAMIN B12 Take 1,000 mcg by mouth every other day. In the morning.   Eliquis 5 MG Tabs tablet Generic drug: apixaban TAKE ONE TABLET BY MOUTH TWICE A DAY   flecainide 100 MG tablet Commonly known as: TAMBOCOR Take 1 tablet (100 mg total) by mouth 2 (two) times daily.   metoprolol succinate 25 MG 24 hr tablet Commonly known as: Toprol XL Take 1 tablet (25 mg total) by mouth daily.   omeprazole 20 MG tablet Commonly known as: PRILOSEC OTC Take 20 mg by mouth in the morning.   tamsulosin 0.4 MG Caps capsule Commonly known as: FLOMAX Take 0.4 mg by mouth in the morning and at bedtime.        Allergies:  Allergies  Allergen Reactions   Penicillins Other (See Comments)    Mother told him throat swelled up when he took at a young age  Did it involve swelling of the face/tongue/throat, SOB, or low BP? Yes Did it involve sudden or severe rash/hives, skin peeling, or any reaction on the inside of your mouth or nose? Unknown Did you need to seek medical attention at a hospital or doctor's office? Yes When did it last happen?  Childhood reaction    If all above answers are "NO", may proceed with cephalosporin use.     Family History: Family History  Problem Relation Age of Onset   Diabetes Mother    Breast cancer Mother    Diabetes Father    Colon cancer Neg Hx    Pancreatic cancer Neg Hx    Prostate cancer Neg Hx     Social History:  reports that he quit smoking about 19 years ago. His smoking use included cigarettes. He started smoking about 29 years ago. He has a 10 pack-year smoking history. He has never used smokeless tobacco. He reports that he does not drink alcohol and does not use drugs.  ROS: All other review of systems were reviewed and are negative except what is noted above in HPI  Physical Exam: BP 126/81   Pulse 73   Constitutional:  Alert and oriented, No acute  distress. HEENT: [place] AT, moist mucus membranes.  Trachea midline, no masses. Cardiovascular: No clubbing, cyanosis, or edema. Respiratory: Normal respiratory effort, no increased work of breathing. GI: Abdomen is soft, nontender, nondistended, no abdominal masses GU: No CVA tenderness.  Lymph: No cervical or inguinal lymphadenopathy. Skin: No rashes, bruises or suspicious lesions. Neurologic: Grossly intact, no focal deficits, moving all 4 extremities. Psychiatric: Normal mood and affect.  Laboratory Data: Lab Results  Component Value Date   WBC 7.0 12/06/2022   HGB 17.9 (H) 12/06/2022   HCT 54.9 (H) 12/06/2022   MCV 90.9 12/06/2022   PLT  186 12/06/2022    Lab Results  Component Value Date   CREATININE 1.24 11/17/2022    No results found for: "PSA"  No results found for: "TESTOSTERONE"  Lab Results  Component Value Date   HGBA1C 5.9 (H) 04/02/2013    Urinalysis    Component Value Date/Time   COLORURINE YELLOW 09/10/2008 0630   APPEARANCEUR Clear 10/11/2022 0827   LABSPEC 1.025 09/10/2008 0630   PHURINE 5.5 09/10/2008 0630   GLUCOSEU Negative 10/11/2022 0827   HGBUR TRACE (A) 09/10/2008 0630   BILIRUBINUR Negative 10/11/2022 0827   KETONESUR NEGATIVE 09/10/2008 0630   PROTEINUR Negative 10/11/2022 0827   PROTEINUR NEGATIVE 09/10/2008 0630   UROBILINOGEN 0.2 09/10/2008 0630   NITRITE Negative 10/11/2022 0827   NITRITE NEGATIVE 09/10/2008 0630   LEUKOCYTESUR Negative 10/11/2022 0827    Lab Results  Component Value Date   LABMICR Comment 10/11/2022   WBCUA 0-5 06/28/2022   LABEPIT >10 (A) 06/28/2022   BACTERIA None seen 06/28/2022    Pertinent Imaging:  No results found for this or any previous visit.  No results found for this or any previous visit.  No results found for this or any previous visit.  No results found for this or any previous visit.  No results found for this or any previous visit.  No valid procedures specified. No results found  for this or any previous visit.  No results found for this or any previous visit.   Assessment & Plan:    1. Nocturia We will proceed with urodynamics since his LUTS are refractor to both BPH and OAb therapies - Urinalysis, Routine w reflex microscopic  2. Prostate cancer (HCC) -followup in 6 months with a PSA  3. Urinary frequency -schedule for urodynamics   No follow-ups on file.  Wilkie Aye, MD  [facility] [place]

## 2022-12-14 LAB — PSA: Prostate Specific Ag, Serum: 0.9 ng/mL (ref 0.0–4.0)

## 2022-12-15 ENCOUNTER — Encounter: Payer: Self-pay | Admitting: Urology

## 2022-12-21 DIAGNOSIS — Z79899 Other long term (current) drug therapy: Secondary | ICD-10-CM | POA: Diagnosis not present

## 2022-12-21 DIAGNOSIS — F329 Major depressive disorder, single episode, unspecified: Secondary | ICD-10-CM | POA: Diagnosis not present

## 2022-12-21 DIAGNOSIS — M1000 Idiopathic gout, unspecified site: Secondary | ICD-10-CM | POA: Diagnosis not present

## 2022-12-21 DIAGNOSIS — C61 Malignant neoplasm of prostate: Secondary | ICD-10-CM | POA: Diagnosis not present

## 2022-12-21 DIAGNOSIS — I4819 Other persistent atrial fibrillation: Secondary | ICD-10-CM | POA: Diagnosis not present

## 2022-12-21 DIAGNOSIS — R7303 Prediabetes: Secondary | ICD-10-CM | POA: Diagnosis not present

## 2022-12-27 DIAGNOSIS — M109 Gout, unspecified: Secondary | ICD-10-CM | POA: Diagnosis not present

## 2022-12-27 DIAGNOSIS — R7309 Other abnormal glucose: Secondary | ICD-10-CM | POA: Diagnosis not present

## 2022-12-27 DIAGNOSIS — I4821 Permanent atrial fibrillation: Secondary | ICD-10-CM | POA: Diagnosis not present

## 2022-12-27 DIAGNOSIS — D751 Secondary polycythemia: Secondary | ICD-10-CM | POA: Diagnosis not present

## 2023-01-05 DIAGNOSIS — R3915 Urgency of urination: Secondary | ICD-10-CM | POA: Diagnosis not present

## 2023-01-31 ENCOUNTER — Encounter: Payer: Self-pay | Admitting: Nurse Practitioner

## 2023-01-31 ENCOUNTER — Ambulatory Visit: Payer: PPO | Attending: Nurse Practitioner | Admitting: Nurse Practitioner

## 2023-01-31 VITALS — BP 110/70 | HR 60 | Ht 70.0 in | Wt 210.2 lb

## 2023-01-31 DIAGNOSIS — I4891 Unspecified atrial fibrillation: Secondary | ICD-10-CM | POA: Diagnosis not present

## 2023-01-31 DIAGNOSIS — E785 Hyperlipidemia, unspecified: Secondary | ICD-10-CM

## 2023-01-31 DIAGNOSIS — E119 Type 2 diabetes mellitus without complications: Secondary | ICD-10-CM | POA: Diagnosis not present

## 2023-01-31 NOTE — Patient Instructions (Addendum)

## 2023-01-31 NOTE — Progress Notes (Signed)
Cardiology Office Note:  .   Date:  01/31/2023  ID:  Brandon Maddox, DOB 08-22-1950, MRN [id number] PCP: Carylon Perches, MD  [facility] Providers Cardiologist:  Nona Dell, MD    History of Present Illness: Brandon Maddox is a 72 y.o. male with a PMH of persistent A-fib, type 2 diabetes, BPH, history of prostate cancer, PCV (follows Dr. Kirtland Bouchard), hyperlipidemia, who presents today for follow-up post cardioversion.  Last seen by Dr. Diona Browner on November 09, 2022.  Patient reported decreased stamina, remained in persistent A-fib and heart rate was well-controlled.  Denied any chest pain, syncope, or dizziness.  Patient was set up for cardioversion. Dr. Diona Browner recommended that if he did not maintain sinus rhythm after cardioversion, would likely plan on EP referral for discussion of alternative antiarrhythmic therapy versus ablation.  Underwent cardioversion on November 18, 2022.  Today he presents for cardioversion follow-up.  He states he is doing well.  He denies any recurrent episodes of A-fib since cardioversion.  EKG today reveals he is in sinus rhythm.  Compliant with his medications and tolerating well. Denies any chest pain, shortness of breath, palpitations, syncope, presyncope, dizziness, orthopnea, PND, swelling or significant weight changes, acute bleeding, or claudication.  ROS: Negative.  See HPI.  Studies Reviewed: Marland Kitchen    EKG:  EKG Interpretation Date/Time:  Monday January 31 2023 08:39:58 EDT Ventricular Rate:  59 PR Interval:  210 QRS Duration:  96 QT Interval:  418 QTC Calculation: 413 R Axis:   -2  Text Interpretation: Sinus bradycardia with 1st degree A-V block Anteroseptal infarct , age undetermined When compared with ECG of 18-Nov-2022 12:56, Nonspecific T wave abnormality no longer evident in Anterior leads Confirmed by Sharlene Dory [phone]) on 01/31/2023 8:42:26 AM   Lexiscan 02/2021:   Findings are consistent with no ischemia. The study is low risk.   No ST  deviation was noted. The ECG was negative for ischemia.   LV perfusion is normal.   Left ventricular function is normal. Nuclear stress EF: 77 %.   Low risk test with no ischemia and vigorous LVEF at 77%.  Echocardiogram 02/2021: 1. Left ventricular ejection fraction, by estimation, is 55 to 60%. The  left ventricle has normal function. The left ventricle has no regional  wall motion abnormalities. Left ventricular diastolic parameters are  indeterminate.   2. Right ventricular systolic function is normal. The right ventricular  size is normal. There is normal pulmonary artery systolic pressure.   3. Left atrial size was severely dilated.   4. The mitral valve is abnormal. Mild mitral valve regurgitation. No  evidence of mitral stenosis.   5. The tricuspid valve is abnormal.   6. The aortic valve is tricuspid. Aortic valve regurgitation is not  visualized. No aortic stenosis is present.   7. The inferior vena cava is dilated in size with >50% respiratory  variability, suggesting right atrial pressure of 8 mmHg.  Physical Exam:   VS:  BP 110/70   Pulse 60   Ht 5\' 10"  (1.778 m)   Wt 210 lb 3.2 oz (95.3 kg)   SpO2 97%   BMI 30.16 kg/m    Wt Readings from Last 3 Encounters:  01/31/23 210 lb 3.2 oz (95.3 kg)  11/18/22 211 lb 3.2 oz (95.8 kg)  11/17/22 211 lb 3.2 oz (95.8 kg)    GEN: Well nourished, well developed in no acute distress NECK: No JVD; No carotid bruits CARDIAC: S1/S2, RRR, no murmurs, rubs,  gallops RESPIRATORY:  Clear to auscultation without rales, wheezing or rhonchi  EXTREMITIES:  No edema; No deformity   ASSESSMENT AND PLAN: .    1.  Persistent A-fib, s/p DCCV Denies any recurrence in A-fib since cardioversion.  EKG reveals sinus bradycardia, 59 bpm.  He is compliant with medications and tolerating these well.  Denies any bleeding issues on Eliquis.  Continue Eliquis 5 mg twice daily for stroke prevention.  Continue flecainide and metoprolol. Heart healthy diet  and regular cardiovascular exercise encouraged.   2. Hyperlipidemia Recent LDL last month revealed LDL of 112.  He is not on any cholesterol-lowering medications.  He has follow-up with PCP next month.  This is being managed by PCP. Heart healthy diet and regular cardiovascular exercise encouraged.   3. Type 2 diabetes Most recent labs with PCP revealed A1c of 5.8%. Diabetic, heart healthy diet and regular cardiovascular exercise encouraged.   Dispo: Follow-up with me/[facility] in 6 months or sooner if anything changes.  Signed, Sharlene Dory, NP

## 2023-02-01 ENCOUNTER — Ambulatory Visit: Payer: PPO | Admitting: Nurse Practitioner

## 2023-02-09 ENCOUNTER — Ambulatory Visit: Payer: PPO | Admitting: Urology

## 2023-02-09 VITALS — BP 103/67 | HR 65

## 2023-02-09 DIAGNOSIS — N138 Other obstructive and reflux uropathy: Secondary | ICD-10-CM

## 2023-02-09 DIAGNOSIS — R351 Nocturia: Secondary | ICD-10-CM | POA: Diagnosis not present

## 2023-02-09 DIAGNOSIS — N401 Enlarged prostate with lower urinary tract symptoms: Secondary | ICD-10-CM | POA: Diagnosis not present

## 2023-02-09 DIAGNOSIS — R350 Frequency of micturition: Secondary | ICD-10-CM

## 2023-02-09 MED ORDER — GEMTESA 75 MG PO TABS: 1.0000 | ORAL_TABLET | Freq: Every day | ORAL | Status: DC

## 2023-02-09 NOTE — Progress Notes (Signed)
02/09/2023 12:15 PM   Brandon Maddox Jul 02, 1950 [id number]  Referring provider: Carylon Perches, MD [address] [place],  [place] [address]  Followup BPh and urinary urgency   HPI: [name] is a 72yo here for followup for BPH with urinary urgency, frequency, and nocturia.  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 Mas detrusor pressure was 45cm of Water. He is currently on flomax 0.4mg  BID. No other complaints today   PMH: Past Medical History:  Diagnosis Date   Acid reflux    Atrial fibrillation (HCC)    Closed right scapular fracture    Depression    Dysrhythmia    GERD (gastroesophageal reflux disease)    Gout    Low testosterone    OSA on CPAP    Pre-diabetes    Prostate cancer (HCC)    Ribs, multiple fractures    Wears glasses     Surgical History: Past Surgical History:  Procedure Laterality Date   CARDIOVERSION N/A 04/02/2021   Procedure: CARDIOVERSION;  Surgeon: Jonelle Sidle, MD;  [facility]: [facility];  Service: Cardiovascular;  Laterality: N/A;   CARDIOVERSION N/A 11/18/2022   Procedure: CARDIOVERSION;  Surgeon: Antoine Poche, MD;  Location: [facility];  Service: Endoscopy;  Laterality: N/A;   COLONOSCOPY WITH PROPOFOL N/A 06/01/2018   Surgeon: Corbin Ade, MD; 2 tubular adenomas, benign polyps, pancolonic diverticulosis.  Surveillance in 3 years.   COLONOSCOPY WITH PROPOFOL N/A 11/25/2021   Procedure: COLONOSCOPY WITH PROPOFOL;  Surgeon: Corbin Ade, MD;  [facility];  Service: Endoscopy;  Laterality: N/A;  9:15am   CYSTOSCOPY N/A 06/26/2020   Procedure: CYSTOSCOPY;  Surgeon: Malen Gauze, MD;  Location: [facility];  Service: Urology;  Laterality: N/A;   HEMOSTASIS CLIP PLACEMENT  11/25/2021   Procedure: HEMOSTASIS CLIP PLACEMENT;  Surgeon: Corbin Ade, MD;  [facility];  Service: Endoscopy;;   HIP SURGERY Right 2010    Dr. Duke Salvia and then replacement of right hip   LACERATION REPAIR  05/03/2012   Procedure: REPAIR MULTIPLE LACERATIONS;  Surgeon: Osborn Coho, MD;  Location: [facility] OR;  Service: ENT;  Laterality: N/A;   PENILE PROSTHESIS IMPLANT     PENILE PROSTHESIS IMPLANT  2014   POLYPECTOMY  06/01/2018   Procedure: POLYPECTOMY;  Surgeon: Corbin Ade, MD;  [facility];  Service: Endoscopy;;  cecum,hepatic flexure   POLYPECTOMY  11/25/2021   Procedure: POLYPECTOMY;  Surgeon: Corbin Ade, MD;  [facility];  Service: Endoscopy;;   Pyloric stenosis     52 weeks old   RADIOACTIVE SEED IMPLANT N/A 06/26/2020   Procedure: RADIOACTIVE SEED IMPLANT/BRACHYTHERAPY IMPLANT;  Surgeon: Malen Gauze, MD;  Location: [facility];  Service: Urology;  Laterality: N/A;   SPACE OAR INSTILLATION N/A 06/26/2020   Procedure: SPACE OAR INSTILLATION;  Surgeon: Malen Gauze, MD;  Location: [facility];  Service: Urology;  Laterality: N/A;    Home Medications:  Allergies as of 02/09/2023       Reactions   Penicillins Other (See Comments)   Mother told him throat swelled up when he took at a young age  Did it involve swelling of the face/tongue/throat, SOB, or low BP? Yes Did it involve sudden or severe rash/hives, skin peeling, or any reaction on the inside of your mouth or nose? Unknown Did you need to seek medical attention at a hospital or  doctor's office? Yes When did it last happen?  Childhood reaction    If all above answers are "NO", may proceed with cephalosporin use.        Medication List        Accurate as of February 09, 2023 12:15 PM. If you have any questions, ask your nurse or doctor.          allopurinol 300 MG tablet Commonly known as: ZYLOPRIM Take 300 mg by mouth in the morning.   buPROPion 150 MG 24 hr tablet Commonly known as: WELLBUTRIN XL Take 150 mg by mouth in the morning, at noon, and at bedtime.    cholecalciferol 25 MCG (1000 UNIT) tablet Commonly known as: VITAMIN D3 Take 1,000 Units by mouth in the morning.   cyanocobalamin 1000 MCG tablet Commonly known as: VITAMIN B12 Take 1,000 mcg by mouth every other day. In the morning.   Eliquis 5 MG Tabs tablet Generic drug: apixaban TAKE ONE TABLET BY MOUTH TWICE A DAY   flecainide 100 MG tablet Commonly known as: TAMBOCOR Take 1 tablet (100 mg total) by mouth 2 (two) times daily.   metoprolol succinate 25 MG 24 hr tablet Commonly known as: Toprol XL Take 1 tablet (25 mg total) by mouth daily.   omeprazole 20 MG tablet Commonly known as: PRILOSEC OTC Take 20 mg by mouth in the morning.   tamsulosin 0.4 MG Caps capsule Commonly known as: FLOMAX Take 0.4 mg by mouth in the morning and at bedtime.        Allergies:  Allergies  Allergen Reactions   Penicillins Other (See Comments)    Mother told him throat swelled up when he took at a young age  Did it involve swelling of the face/tongue/throat, SOB, or low BP? Yes Did it involve sudden or severe rash/hives, skin peeling, or any reaction on the inside of your mouth or nose? Unknown Did you need to seek medical attention at a hospital or doctor's office? Yes When did it last happen?  Childhood reaction    If all above answers are "NO", may proceed with cephalosporin use.     Family History: Family History  Problem Relation Age of Onset   Diabetes Mother    Breast cancer Mother    Diabetes Father    Colon cancer Neg Hx    Pancreatic cancer Neg Hx    Prostate cancer Neg Hx     Social History:  reports that he quit smoking about 19 years ago. His smoking use included cigarettes. He started smoking about 29 years ago. He has a 10 pack-year smoking history. He has never used smokeless tobacco. He reports that he does not drink alcohol and does not use drugs.  ROS: All other review of systems were reviewed and are negative except what is noted above in  HPI  Physical Exam: BP 103/67   Pulse 65   Constitutional:  Alert and oriented, No acute distress. HEENT: [place] AT, moist mucus membranes.  Trachea midline, no masses. Cardiovascular: No clubbing, cyanosis, or edema. Respiratory: Normal respiratory effort, no increased work of breathing. GI: Abdomen is soft, nontender, nondistended, no abdominal masses GU: No CVA tenderness.  Lymph: No cervical or inguinal lymphadenopathy. Skin: No rashes, bruises or suspicious lesions. Neurologic: Grossly intact, no focal deficits, moving all 4 extremities. Psychiatric: Normal mood and affect.  Laboratory Data: Lab Results  Component Value Date   WBC 7.0 12/06/2022   HGB 17.9 (H) 12/06/2022   HCT 54.9 (H) 12/06/2022  MCV 90.9 12/06/2022   PLT 186 12/06/2022    Lab Results  Component Value Date   CREATININE 1.24 11/17/2022    No results found for: "PSA"  No results found for: "TESTOSTERONE"  Lab Results  Component Value Date   HGBA1C 5.9 (H) 04/02/2013    Urinalysis    Component Value Date/Time   COLORURINE YELLOW 09/10/2008 0630   APPEARANCEUR Clear 12/13/2022 0913   LABSPEC 1.025 09/10/2008 0630   PHURINE 5.5 09/10/2008 0630   GLUCOSEU Negative 12/13/2022 0913   HGBUR TRACE (A) 09/10/2008 0630   BILIRUBINUR Negative 12/13/2022 0913   KETONESUR NEGATIVE 09/10/2008 0630   PROTEINUR Negative 12/13/2022 0913   PROTEINUR NEGATIVE 09/10/2008 0630   UROBILINOGEN 0.2 09/10/2008 0630   NITRITE Negative 12/13/2022 0913   NITRITE NEGATIVE 09/10/2008 0630   LEUKOCYTESUR Negative 12/13/2022 0913    Lab Results  Component Value Date   LABMICR Comment 12/13/2022   WBCUA 0-5 06/28/2022   LABEPIT >10 (A) 06/28/2022   BACTERIA None seen 06/28/2022    Pertinent Imaging:  No results found for this or any previous visit.  No results found for this or any previous visit.  No results found for this or any previous visit.  No results found for this or any previous visit.  No  results found for this or any previous visit.  No valid procedures specified. No results found for this or any previous visit.  No results found for this or any previous visit.   Assessment & Plan:    1. Urinary frequency -We will trial gemtesa 75mg  daily  2. Nocturia -contineu flomax 0.4mg  BID  3. Benign prostatic hyperplasia with urinary obstruction -continue flomax 0.4mg  BID   No follow-ups on file.  Wilkie Aye, MD  [facility] [place]

## 2023-02-13 ENCOUNTER — Encounter: Payer: Self-pay | Admitting: Urology

## 2023-02-13 NOTE — Patient Instructions (Signed)

## 2023-03-07 ENCOUNTER — Inpatient Hospital Stay: Payer: PPO

## 2023-03-07 ENCOUNTER — Inpatient Hospital Stay: Payer: PPO | Admitting: Physician Assistant

## 2023-03-28 ENCOUNTER — Ambulatory Visit: Payer: PPO | Admitting: Urology

## 2023-03-28 ENCOUNTER — Encounter: Payer: Self-pay | Admitting: Urology

## 2023-03-28 VITALS — BP 113/79 | HR 67

## 2023-03-28 DIAGNOSIS — R350 Frequency of micturition: Secondary | ICD-10-CM

## 2023-03-28 DIAGNOSIS — Z8546 Personal history of malignant neoplasm of prostate: Secondary | ICD-10-CM | POA: Diagnosis not present

## 2023-03-28 DIAGNOSIS — N401 Enlarged prostate with lower urinary tract symptoms: Secondary | ICD-10-CM | POA: Diagnosis not present

## 2023-03-28 DIAGNOSIS — C61 Malignant neoplasm of prostate: Secondary | ICD-10-CM

## 2023-03-28 DIAGNOSIS — N138 Other obstructive and reflux uropathy: Secondary | ICD-10-CM | POA: Diagnosis not present

## 2023-03-28 LAB — URINALYSIS, ROUTINE W REFLEX MICROSCOPIC
Bilirubin, UA: NEGATIVE
Glucose, UA: NEGATIVE
Ketones, UA: NEGATIVE
Leukocytes,UA: NEGATIVE
Nitrite, UA: NEGATIVE
Protein,UA: NEGATIVE
RBC, UA: NEGATIVE
Specific Gravity, UA: 1.03 (ref 1.005–1.030)
Urobilinogen, Ur: 1 mg/dL (ref 0.2–1.0)
pH, UA: 5.5 (ref 5.0–7.5)

## 2023-03-28 MED ORDER — SOLIFENACIN SUCCINATE 5 MG PO TABS: 5.0000 mg | ORAL_TABLET | Freq: Every day | ORAL | 11 refills | Status: DC

## 2023-03-28 NOTE — Patient Instructions (Signed)

## 2023-03-28 NOTE — Progress Notes (Signed)
03/28/2023 8:45 AM   Brandon Maddox 07-21-1950 [id number]  Referring provider: Carylon Perches, MD [address] [place],  [place] [address]  Followup urinary frequency   HPI: Mr Dettman is a 72yo here for followup for urinary frequency. He noted no improvement in his urinary frequency on Gemtesa 75mg  daily. IPSS 19 QOL 4 on flomax 0.4mg  BID. Urine stream stream. Urinary urgency improve on flomax but not gemtesa.    PMH: Past Medical History:  Diagnosis Date   Acid reflux    Atrial fibrillation (HCC)    Closed right scapular fracture    Depression    Dysrhythmia    GERD (gastroesophageal reflux disease)    Gout    Low testosterone    OSA on CPAP    Pre-diabetes    Prostate cancer (HCC)    Ribs, multiple fractures    Wears glasses     Surgical History: Past Surgical History:  Procedure Laterality Date   CARDIOVERSION N/A 04/02/2021   Procedure: CARDIOVERSION;  Surgeon: Jonelle Sidle, MD;  Location: [facility];  Service: Cardiovascular;  Laterality: N/A;   CARDIOVERSION N/A 11/18/2022   Procedure: CARDIOVERSION;  Surgeon: Antoine Poche, MD;  Location: [facility];  Service: Endoscopy;  Laterality: N/A;   COLONOSCOPY WITH PROPOFOL N/A 06/01/2018   Surgeon: Corbin Ade, MD; 2 tubular adenomas, benign polyps, pancolonic diverticulosis.  Surveillance in 3 years.   COLONOSCOPY WITH PROPOFOL N/A 11/25/2021   Procedure: COLONOSCOPY WITH PROPOFOL;  Surgeon: Corbin Ade, MD;  [facility];  Service: Endoscopy;  Laterality: N/A;  9:15am   CYSTOSCOPY N/A 06/26/2020   Procedure: CYSTOSCOPY;  Surgeon: Malen Gauze, MD;  Location: [facility];  Service: Urology;  Laterality: N/A;   HEMOSTASIS CLIP PLACEMENT  11/25/2021   Procedure: HEMOSTASIS CLIP PLACEMENT;  Surgeon: Corbin Ade, MD;  [facility];  Service: Endoscopy;;   HIP SURGERY Right 2010   Dr. 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   RADIOACTIVE SEED IMPLANT N/A 06/26/2020   Procedure: RADIOACTIVE SEED IMPLANT/BRACHYTHERAPY IMPLANT;  Surgeon: Malen Gauze, MD;  Location: [facility];  Service: Urology;  Laterality: N/A;   SPACE OAR INSTILLATION N/A 06/26/2020   Procedure: SPACE OAR INSTILLATION;  Surgeon: Malen Gauze, MD;  Location: [facility];  Service: Urology;  Laterality: N/A;    Home Medications:  Allergies as of 03/28/2023       Reactions   Penicillins Other (See Comments)   Mother told him throat swelled up when he took at a young age  Did it involve swelling of the face/tongue/throat, SOB, or low BP? Yes Did it involve sudden or severe rash/hives, skin peeling, or any reaction on the inside of your mouth or nose? Unknown Did you need to seek medical attention at a hospital or doctor's office? Yes When did it last happen?  Childhood reaction    If all above answers are "NO", may proceed with cephalosporin use.        Medication List        Accurate as of March 28, 2023  8:45 AM. If you have any questions, ask your nurse or doctor.          allopurinol 300 MG tablet Commonly known as: ZYLOPRIM Take 300 mg by mouth in the morning.   buPROPion 150 MG 24 hr tablet Commonly known as: WELLBUTRIN XL Take 150 mg by mouth in the morning, at noon, and at bedtime.   cholecalciferol 25 MCG (1000 UNIT) tablet Commonly known as: VITAMIN D3 Take 1,000 Units by  mouth in the morning.   cyanocobalamin 1000 MCG tablet Commonly known as: VITAMIN B12 Take 1,000 mcg by mouth every other day. In the morning.   Eliquis 5 MG Tabs tablet Generic drug: apixaban TAKE ONE TABLET BY MOUTH TWICE A DAY   flecainide 100 MG tablet Commonly known as: TAMBOCOR Take 1 tablet (100 mg total) by mouth 2 (two) times daily.   Gemtesa 75 MG Tabs Generic drug: Vibegron Take 1 tablet (75 mg total) by mouth daily.   metoprolol succinate 25 MG 24 hr tablet Commonly known as: Toprol XL Take 1 tablet (25 mg total) by mouth daily.   omeprazole 20 MG tablet Commonly known as: PRILOSEC OTC Take 20 mg by mouth in the morning.   tamsulosin 0.4 MG Caps capsule Commonly known as: FLOMAX Take 0.4 mg by mouth in the morning and at bedtime.        Allergies:  Allergies  Allergen Reactions   Penicillins Other (See Comments)    Mother told him throat swelled up when he took at a young age  Did it involve swelling of the face/tongue/throat, SOB, or low BP? Yes Did it involve sudden or severe rash/hives, skin peeling, or any reaction on the inside of your mouth or nose? Unknown Did you need to seek medical attention at a hospital or doctor's office? Yes When did it last happen?  Childhood reaction    If all above answers are "NO", may proceed with cephalosporin use.     Family History: Family History  Problem Relation Age of Onset   Diabetes Mother    Breast cancer Mother    Diabetes Father    Colon cancer Neg Hx    Pancreatic cancer Neg Hx    Prostate cancer Neg Hx     Social History:  reports that he quit smoking about 19 years ago. His smoking use included cigarettes. He started smoking about 29 years ago. He has a 10 pack-year smoking history. He has never used smokeless tobacco. He reports that he does not drink alcohol and does not use drugs.  ROS: All other review of systems were reviewed and are negative except what is noted above in HPI  Physical  Exam: BP 113/79   Pulse 67   Constitutional:  Alert and oriented, No acute distress. HEENT: [place] AT, moist mucus membranes.  Trachea midline, no masses. Cardiovascular: No clubbing, cyanosis, or edema. Respiratory: Normal respiratory effort, no increased work of breathing. GI: Abdomen is soft, nontender, nondistended, no abdominal masses GU: No CVA tenderness.  Lymph: No cervical or inguinal lymphadenopathy. Skin: No rashes, bruises or suspicious lesions. Neurologic: Grossly intact, no focal deficits, moving all 4 extremities. Psychiatric: Normal mood and affect.  Laboratory Data: Lab Results  Component Value Date   WBC 7.0 12/06/2022  HGB 17.9 (H) 12/06/2022   HCT 54.9 (H) 12/06/2022   MCV 90.9 12/06/2022   PLT 186 12/06/2022    Lab Results  Component Value Date   CREATININE 1.24 11/17/2022    No results found for: "PSA"  No results found for: "TESTOSTERONE"  Lab Results  Component Value Date   HGBA1C 5.9 (H) 04/02/2013    Urinalysis    Component Value Date/Time   COLORURINE YELLOW 09/10/2008 0630   APPEARANCEUR Clear 12/13/2022 0913   LABSPEC 1.025 09/10/2008 0630   PHURINE 5.5 09/10/2008 0630   GLUCOSEU Negative 12/13/2022 0913   HGBUR TRACE (A) 09/10/2008 0630   BILIRUBINUR Negative 12/13/2022 0913   KETONESUR NEGATIVE 09/10/2008 0630   PROTEINUR Negative 12/13/2022 0913   PROTEINUR NEGATIVE 09/10/2008 0630   UROBILINOGEN 0.2 09/10/2008 0630   NITRITE Negative 12/13/2022 0913   NITRITE NEGATIVE 09/10/2008 0630   LEUKOCYTESUR Negative 12/13/2022 0913    Lab Results  Component Value Date   LABMICR Comment 12/13/2022   WBCUA 0-5 06/28/2022   LABEPIT >10 (A) 06/28/2022   BACTERIA None seen 06/28/2022    Pertinent Imaging:  No results found for this or any previous visit.  No results found for this or any previous visit.  No results found for this or any previous visit.  No results found for this or any previous visit.  No results found for  this or any previous visit.  No valid procedures specified. No results found for this or any previous visit.  No results found for this or any previous visit.   Assessment & Plan:    1. Urinary frequency -we will trial vesicare 5mg  daily - Urinalysis, Routine w reflex microscopic  2. Benign prostatic hyperplasia with urinary obstruction Continue flomax 0.4mg  BID   No follow-ups on file.  Wilkie Aye, MD  [facility]

## 2023-03-28 NOTE — Addendum Note (Signed)
Addended by: Malen Gauze on: 03/28/2023 08:50 AM   Modules accepted: Orders

## 2023-03-29 DIAGNOSIS — D751 Secondary polycythemia: Secondary | ICD-10-CM | POA: Diagnosis not present

## 2023-03-29 DIAGNOSIS — I480 Paroxysmal atrial fibrillation: Secondary | ICD-10-CM | POA: Diagnosis not present

## 2023-05-11 ENCOUNTER — Other Ambulatory Visit: Payer: Self-pay | Admitting: Cardiology

## 2023-05-11 DIAGNOSIS — I4819 Other persistent atrial fibrillation: Secondary | ICD-10-CM

## 2023-05-11 NOTE — Telephone Encounter (Signed)
Prescription refill request for Eliquis received. Indication: AF Last office visit: 01/31/23  Shawnie Dapper NP Scr: 1.24 on 11/17/22 Age: 73 Weight: 95.3kg  Based on above findings Eliquis 5mg  twice daily is the appropriate dose.  Refill approved.

## 2023-06-15 ENCOUNTER — Other Ambulatory Visit: Payer: PPO

## 2023-06-15 DIAGNOSIS — C61 Malignant neoplasm of prostate: Secondary | ICD-10-CM | POA: Diagnosis not present

## 2023-06-16 LAB — PSA: Prostate Specific Ag, Serum: 0.2 ng/mL (ref 0.0–4.0)

## 2023-06-20 DIAGNOSIS — D751 Secondary polycythemia: Secondary | ICD-10-CM | POA: Diagnosis not present

## 2023-06-20 DIAGNOSIS — Z79899 Other long term (current) drug therapy: Secondary | ICD-10-CM | POA: Diagnosis not present

## 2023-06-20 DIAGNOSIS — R7303 Prediabetes: Secondary | ICD-10-CM | POA: Diagnosis not present

## 2023-06-20 DIAGNOSIS — I480 Paroxysmal atrial fibrillation: Secondary | ICD-10-CM | POA: Diagnosis not present

## 2023-06-20 DIAGNOSIS — C61 Malignant neoplasm of prostate: Secondary | ICD-10-CM | POA: Diagnosis not present

## 2023-06-20 DIAGNOSIS — M1000 Idiopathic gout, unspecified site: Secondary | ICD-10-CM | POA: Diagnosis not present

## 2023-06-22 ENCOUNTER — Ambulatory Visit: Payer: PPO | Admitting: Urology

## 2023-06-22 VITALS — BP 107/69 | HR 65

## 2023-06-22 DIAGNOSIS — N401 Enlarged prostate with lower urinary tract symptoms: Secondary | ICD-10-CM

## 2023-06-22 DIAGNOSIS — R351 Nocturia: Secondary | ICD-10-CM | POA: Diagnosis not present

## 2023-06-22 DIAGNOSIS — N138 Other obstructive and reflux uropathy: Secondary | ICD-10-CM | POA: Diagnosis not present

## 2023-06-22 DIAGNOSIS — R350 Frequency of micturition: Secondary | ICD-10-CM | POA: Diagnosis not present

## 2023-06-22 DIAGNOSIS — C61 Malignant neoplasm of prostate: Secondary | ICD-10-CM

## 2023-06-22 LAB — URINALYSIS, ROUTINE W REFLEX MICROSCOPIC
Bilirubin, UA: NEGATIVE
Glucose, UA: NEGATIVE
Nitrite, UA: NEGATIVE
Protein,UA: NEGATIVE
RBC, UA: NEGATIVE
Specific Gravity, UA: >1.03 — ABNORMAL HIGH (ref 1.005–1.030)
Urobilinogen, Ur: 4 mg/dL — ABNORMAL HIGH (ref 0.2–1.0)
pH, UA: 6 (ref 5.0–7.5)

## 2023-06-22 LAB — MICROSCOPIC EXAMINATION
Bacteria, UA: NONE SEEN
RBC, Urine: NONE SEEN /HPF (ref 0–2)

## 2023-06-22 MED ORDER — SOLIFENACIN SUCCINATE 10 MG PO TABS
10.0000 mg | ORAL_TABLET | Freq: Every day | ORAL | 3 refills | Status: DC
Start: 2023-06-22 — End: 2023-12-26

## 2023-06-22 MED ORDER — TAMSULOSIN HCL 0.4 MG PO CAPS
0.4000 mg | ORAL_CAPSULE | Freq: Two times a day (BID) | ORAL | 3 refills | Status: DC
Start: 2023-06-22 — End: 2023-12-26

## 2023-06-22 NOTE — Progress Notes (Signed)
 06/22/2023 9:53 AM   Brandon Maddox Nov 15, 1950 [id number]  Referring provider: Carylon Perches, MD [address] [place],  [place] [address]  Followup prostate cancer   HPI: [name] is a 72yo here for followup for prostate cancer, BPH and nocturia. PSA 0.2. vesicare parital improved the frequency and nocturia. Nocturia 2x. IPPS 4 QOl 2 on flomax 0.4mg  daily. Urine stream strong. No straining to urinate.   PMH: Past Medical History:  Diagnosis Date   Acid reflux    Atrial fibrillation (HCC)    Closed right scapular fracture    Depression    Dysrhythmia    GERD (gastroesophageal reflux disease)    Gout    Low testosterone    OSA on CPAP    Pre-diabetes    Prostate cancer (HCC)    Ribs, multiple fractures    Wears glasses     Surgical History: Past Surgical History:  Procedure Laterality Date   CARDIOVERSION N/A 04/02/2021   Procedure: CARDIOVERSION;  Surgeon: Jonelle Sidle, MD;  Location: [facility];  Service: Cardiovascular;  Laterality: N/A;   CARDIOVERSION N/A 11/18/2022   Procedure: CARDIOVERSION;  Surgeon: Antoine Poche, MD;  Location: [facility];  Service: Endoscopy;  Laterality: N/A;   COLONOSCOPY WITH PROPOFOL N/A 06/01/2018   Surgeon: Corbin Ade, MD; 2 tubular adenomas, benign polyps, pancolonic diverticulosis.  Surveillance in 3 years.   COLONOSCOPY WITH PROPOFOL N/A 11/25/2021   Procedure: COLONOSCOPY WITH PROPOFOL;  Surgeon: Corbin Ade, MD;  [facility];  Service: Endoscopy;  Laterality: N/A;  9:15am   CYSTOSCOPY N/A 06/26/2020   Procedure: CYSTOSCOPY;  Surgeon: Malen Gauze, MD;  [facility];  Service: Urology;  Laterality: N/A;   HEMOSTASIS CLIP PLACEMENT  11/25/2021   Procedure: HEMOSTASIS CLIP PLACEMENT;  Surgeon: Corbin Ade, MD;  [facility];  Service: Endoscopy;;   HIP SURGERY Right 2010   Dr. Duke Salvia and then replacement of right hip   LACERATION REPAIR  05/03/2012    Procedure: REPAIR MULTIPLE LACERATIONS;  Surgeon: Osborn Coho, MD;  [facility];  Service: ENT;  Laterality: N/A;   PENILE PROSTHESIS IMPLANT     PENILE PROSTHESIS IMPLANT  2014   POLYPECTOMY  06/01/2018   Procedure: POLYPECTOMY;  Surgeon: Corbin Ade, MD;  [facility];  Service: Endoscopy;;  cecum,hepatic flexure   POLYPECTOMY  11/25/2021   Procedure: POLYPECTOMY;  Surgeon: Corbin Ade, MD;  [facility];  Service: Endoscopy;;   Pyloric stenosis     20 weeks old   RADIOACTIVE SEED IMPLANT N/A 06/26/2020   Procedure: RADIOACTIVE SEED IMPLANT/BRACHYTHERAPY IMPLANT;  Surgeon: Malen Gauze, MD;  Location: [facility];  Service: Urology;  Laterality: N/A;   SPACE OAR INSTILLATION N/A 06/26/2020   Procedure: SPACE OAR INSTILLATION;  Surgeon: Malen Gauze, MD;  Location: [facility];  Service: Urology;  Laterality: N/A;    Home Medications:  Allergies as of 06/22/2023       Reactions   Penicillins Other (See Comments)   Mother told him throat swelled up when he took at a young age  Did it involve swelling of the face/tongue/throat, SOB, or low BP? Yes Did it involve sudden or severe rash/hives, skin peeling, or any reaction on the inside of your mouth or nose? Unknown Did you need to seek medical attention at a hospital or doctor's office? Yes When did it last happen?  Childhood reaction  If all above answers are "NO", may proceed with cephalosporin use.        Medication List        Accurate as of June 22, 2023  9:53 AM. If you have any questions, ask your nurse or doctor.          STOP taking these medications    Gemtesa 75 MG Tabs Generic drug: Vibegron   solifenacin 5 MG tablet Commonly known as: VESICARE       TAKE these medications    allopurinol 300 MG tablet Commonly known as: ZYLOPRIM Take 300 mg by mouth in the morning.   buPROPion 150 MG 24 hr tablet Commonly known as:  WELLBUTRIN XL Take 150 mg by mouth in the morning, at noon, and at bedtime.   cholecalciferol 25 MCG (1000 UNIT) tablet Commonly known as: VITAMIN D3 Take 1,000 Units by mouth in the morning.   cyanocobalamin 1000 MCG tablet Commonly known as: VITAMIN B12 Take 1,000 mcg by mouth every other day. In the morning.   Eliquis 5 MG Tabs tablet Generic drug: apixaban TAKE ONE TABLET BY MOUTH TWICE A DAY   flecainide 100 MG tablet Commonly known as: TAMBOCOR Take 1 tablet (100 mg total) by mouth 2 (two) times daily.   metoprolol succinate 25 MG 24 hr tablet Commonly known as: Toprol XL Take 1 tablet (25 mg total) by mouth daily.   omeprazole 20 MG tablet Commonly known as: PRILOSEC OTC Take 20 mg by mouth in the morning.   tamsulosin 0.4 MG Caps capsule Commonly known as: FLOMAX Take 0.4 mg by mouth in the morning and at bedtime.        Allergies:  Allergies  Allergen Reactions   Penicillins Other (See Comments)    Mother told him throat swelled up when he took at a young age  Did it involve swelling of the face/tongue/throat, SOB, or low BP? Yes Did it involve sudden or severe rash/hives, skin peeling, or any reaction on the inside of your mouth or nose? Unknown Did you need to seek medical attention at a hospital or doctor's office? Yes When did it last happen?  Childhood reaction    If all above answers are "NO", may proceed with cephalosporin use.     Family History: Family History  Problem Relation Age of Onset   Diabetes Mother    Breast cancer Mother    Diabetes Father    Colon cancer Neg Hx    Pancreatic cancer Neg Hx    Prostate cancer Neg Hx     Social History:  reports that he quit smoking about 20 years ago. His smoking use included cigarettes. He started smoking about 30 years ago. He has a 10 pack-year smoking history. He has never used smokeless tobacco. He reports that he does not drink alcohol and does not use drugs.  ROS: All other review of  systems were reviewed and are negative except what is noted above in HPI  Physical Exam: BP 107/69   Pulse 65   Constitutional:  Alert and oriented, No acute distress. HEENT: [place] AT, moist mucus membranes.  Trachea midline, no masses. Cardiovascular: No clubbing, cyanosis, or edema. Respiratory: Normal respiratory effort, no increased work of breathing. GI: Abdomen is soft, nontender, nondistended, no abdominal masses GU: No CVA tenderness.  Lymph: No cervical or inguinal lymphadenopathy. Skin: No rashes, bruises or suspicious lesions. Neurologic: Grossly intact, no focal deficits, moving all 4 extremities. Psychiatric: Normal mood and affect.  Laboratory Data:  Lab Results  Component Value Date   WBC 7.0 12/06/2022   HGB 17.9 (H) 12/06/2022   HCT 54.9 (H) 12/06/2022   MCV 90.9 12/06/2022   PLT 186 12/06/2022    Lab Results  Component Value Date   CREATININE 1.24 11/17/2022    No results found for: "PSA"  No results found for: "TESTOSTERONE"  Lab Results  Component Value Date   HGBA1C 5.9 (H) 04/02/2013    Urinalysis    Component Value Date/Time   COLORURINE YELLOW 09/10/2008 0630   APPEARANCEUR Clear 03/28/2023 0844   LABSPEC 1.025 09/10/2008 0630   PHURINE 5.5 09/10/2008 0630   GLUCOSEU Negative 03/28/2023 0844   HGBUR TRACE (A) 09/10/2008 0630   BILIRUBINUR Negative 03/28/2023 0844   KETONESUR NEGATIVE 09/10/2008 0630   PROTEINUR Negative 03/28/2023 0844   PROTEINUR NEGATIVE 09/10/2008 0630   UROBILINOGEN 0.2 09/10/2008 0630   NITRITE Negative 03/28/2023 0844   NITRITE NEGATIVE 09/10/2008 0630   LEUKOCYTESUR Negative 03/28/2023 0844    Lab Results  Component Value Date   LABMICR Comment 03/28/2023   WBCUA 0-5 06/28/2022   LABEPIT >10 (A) 06/28/2022   BACTERIA None seen 06/28/2022    Pertinent Imaging:  No results found for this or any previous visit.  No results found for this or any previous visit.  No results found for this or any previous  visit.  No results found for this or any previous visit.  No results found for this or any previous visit.  No results found for this or any previous visit.  No results found for this or any previous visit.  No results found for this or any previous visit.   Assessment & Plan:    1. Benign prostatic hyperplasia with urinary obstruction (Primary) Continue flomax 0.4mg  BID - BLADDER SCAN AMB NON-IMAGING - Urinalysis, Routine w reflex microscopic  2. Urinary frequency Increase vesicare to 10mg    3. Prostate cancer (HCC) Followup 6 months with PSA  4. Nocturia Increase vesicare to 10mg    No follow-ups on file.  Wilkie Aye, MD  [facility] [place]

## 2023-06-22 NOTE — Progress Notes (Signed)
 post void residual=60

## 2023-06-27 DIAGNOSIS — D751 Secondary polycythemia: Secondary | ICD-10-CM | POA: Diagnosis not present

## 2023-06-27 DIAGNOSIS — N1831 Chronic kidney disease, stage 3a: Secondary | ICD-10-CM | POA: Diagnosis not present

## 2023-06-28 ENCOUNTER — Encounter: Payer: Self-pay | Admitting: Urology

## 2023-06-28 NOTE — Patient Instructions (Signed)

## 2023-07-05 ENCOUNTER — Other Ambulatory Visit: Payer: PPO

## 2023-07-11 ENCOUNTER — Ambulatory Visit: Payer: PPO | Admitting: Urology

## 2023-08-01 ENCOUNTER — Ambulatory Visit: Payer: PPO | Admitting: Nurse Practitioner

## 2023-08-09 ENCOUNTER — Encounter: Payer: Self-pay | Admitting: Nurse Practitioner

## 2023-08-09 ENCOUNTER — Ambulatory Visit: Attending: Nurse Practitioner | Admitting: Nurse Practitioner

## 2023-08-09 VITALS — BP 126/80 | HR 58 | Ht 70.5 in | Wt 217.0 lb

## 2023-08-09 DIAGNOSIS — E119 Type 2 diabetes mellitus without complications: Secondary | ICD-10-CM | POA: Diagnosis not present

## 2023-08-09 DIAGNOSIS — E785 Hyperlipidemia, unspecified: Secondary | ICD-10-CM

## 2023-08-09 DIAGNOSIS — I4891 Unspecified atrial fibrillation: Secondary | ICD-10-CM | POA: Diagnosis not present

## 2023-08-09 NOTE — Patient Instructions (Addendum)

## 2023-08-09 NOTE — Progress Notes (Signed)
 Cardiology Office Note:  .   Date:  08/09/2023 ID:  Brandon Maddox, DOB 08-14-1950, MRN [id number] PCP: Artemisa Bile, MD  [facility] Providers Cardiologist:  Teddie Favre, MD    History of Present Illness: Brandon Maddox is a 73 y.o. male with a PMH of persistent A-fib, type 2 diabetes, BPH, history of prostate cancer, PCV (follows Dr. Linnell Richardson), hyperlipidemia, CKD, who presents today for 6 month follow-up.   Last seen by Dr. Londa Rival on November 09, 2022.  Patient reported decreased stamina, remained in persistent A-fib and heart rate was well-controlled.  Denied any chest pain, syncope, or dizziness.  Patient was set up for cardioversion. Dr. Londa Rival recommended that if he did not maintain sinus rhythm after cardioversion, would likely plan on EP referral for discussion of alternative antiarrhythmic therapy versus ablation.  Underwent cardioversion on November 18, 2022.  01/31/2023 - Today he presents for cardioversion follow-up.  He states he is doing well.  He denies any recurrent episodes of A-fib since cardioversion.  EKG today reveals he is in sinus rhythm.  Compliant with his medications and tolerating well. Denies any chest pain, shortness of breath, palpitations, syncope, presyncope, dizziness, orthopnea, PND, swelling or significant weight changes, acute bleeding, or claudication.  08/09/2023 - Has been doing well since I last saw him.  Has only had 1-2 episodes of what he believes is brief A-fib, denies any significant palpitations or tachycardia. Denies any chest pain, shortness of breath, syncope, presyncope, dizziness, orthopnea, PND, swelling or significant weight changes, acute bleeding, or claudication.  ROS: Negative.  See HPI.  Studies Reviewed: Aaron Aas    EKG: EKG is not ordered today.      Lexiscan  02/2021:   Findings are consistent with no ischemia. The study is low risk.   No ST deviation was noted. The ECG was negative for ischemia.   LV perfusion is normal.   Left  ventricular function is normal. Nuclear stress EF: 77 %.   Low risk test with no ischemia and vigorous LVEF at 77%.  Echocardiogram 02/2021: 1. Left ventricular ejection fraction, by estimation, is 55 to 60%. The  left ventricle has normal function. The left ventricle has no regional  wall motion abnormalities. Left ventricular diastolic parameters are  indeterminate.   2. Right ventricular systolic function is normal. The right ventricular  size is normal. There is normal pulmonary artery systolic pressure.   3. Left atrial size was severely dilated.   4. The mitral valve is abnormal. Mild mitral valve regurgitation. No  evidence of mitral stenosis.   5. The tricuspid valve is abnormal.   6. The aortic valve is tricuspid. Aortic valve regurgitation is not  visualized. No aortic stenosis is present.   7. The inferior vena cava is dilated in size with >50% respiratory  variability, suggesting right atrial pressure of 8 mmHg.  Physical Exam:   VS:  BP 126/80   Pulse (!) 58   Ht 5' 10.5" (1.791 m)   Wt 217 lb (98.4 kg)   SpO2 95%   BMI 30.70 kg/m    Wt Readings from Last 3 Encounters:  08/09/23 217 lb (98.4 kg)  01/31/23 210 lb 3.2 oz (95.3 kg)  11/18/22 211 lb 3.2 oz (95.8 kg)    GEN: Well nourished, well developed in no acute distress NECK: No JVD; No carotid bruits CARDIAC: S1/S2, RRR, no murmurs, rubs, gallops RESPIRATORY:  Clear to auscultation without rales, wheezing or rhonchi  EXTREMITIES:  No edema;  No deformity   ASSESSMENT AND PLAN: .    1.  A-fib, s/p DCCV Denies any recent significant palpitations or tachycardia. HR is well controlled today. He is compliant with medications and tolerating these well.  Denies any bleeding issues on Eliquis .  Continue Eliquis  5 mg twice daily for stroke prevention.  Continue flecainide  and metoprolol . Heart healthy diet and regular cardiovascular exercise encouraged.   2. Hyperlipidemia I do not see any recent lipid panel since  September 2024.  Will request most recent labs from PCP's office.  This is being managed by PCP. Heart healthy diet and regular cardiovascular exercise encouraged.   3. Type 2 diabetes Being managed by PCP. Will request most recent labs. Diabetic, heart healthy diet and regular cardiovascular exercise encouraged.   Dispo: Follow-up with Dr. Londa Rival or APP in 6 months or sooner if anything changes.  Signed, [name], NP

## 2023-08-10 ENCOUNTER — Encounter: Payer: Self-pay | Admitting: Internal Medicine

## 2023-08-19 ENCOUNTER — Other Ambulatory Visit: Payer: Self-pay | Admitting: Cardiology

## 2023-11-07 ENCOUNTER — Other Ambulatory Visit: Payer: Self-pay | Admitting: Cardiology

## 2023-11-07 DIAGNOSIS — I4819 Other persistent atrial fibrillation: Secondary | ICD-10-CM

## 2023-11-07 NOTE — Telephone Encounter (Signed)
 Prescription refill request for Eliquis  received. Indication: AF Last office visit: 08/09/23  FORBES Crate NP Scr: 1.28 on 06/21/23  Epic Age: 73 Weight: 98.4kg  Based on above findings Eliquis  5mg  twice daily is the appropriate dose.  Refill approved.

## 2023-12-20 ENCOUNTER — Other Ambulatory Visit

## 2023-12-20 DIAGNOSIS — C61 Malignant neoplasm of prostate: Secondary | ICD-10-CM

## 2023-12-21 LAB — PSA: Prostate Specific Ag, Serum: 0.2 ng/mL (ref 0.0–4.0)

## 2023-12-26 ENCOUNTER — Ambulatory Visit: Admitting: Urology

## 2023-12-26 ENCOUNTER — Encounter: Payer: Self-pay | Admitting: Urology

## 2023-12-26 VITALS — BP 125/68 | HR 60 | Wt 215.0 lb

## 2023-12-26 DIAGNOSIS — C61 Malignant neoplasm of prostate: Secondary | ICD-10-CM

## 2023-12-26 DIAGNOSIS — R351 Nocturia: Secondary | ICD-10-CM

## 2023-12-26 DIAGNOSIS — N401 Enlarged prostate with lower urinary tract symptoms: Secondary | ICD-10-CM

## 2023-12-26 DIAGNOSIS — N138 Other obstructive and reflux uropathy: Secondary | ICD-10-CM

## 2023-12-26 DIAGNOSIS — R350 Frequency of micturition: Secondary | ICD-10-CM

## 2023-12-26 MED ORDER — SOLIFENACIN SUCCINATE 10 MG PO TABS: 10.0000 mg | ORAL_TABLET | Freq: Every day | ORAL | 3 refills | Status: AC

## 2023-12-26 MED ORDER — TAMSULOSIN HCL 0.4 MG PO CAPS: 0.4000 mg | ORAL_CAPSULE | Freq: Two times a day (BID) | ORAL | 3 refills | Status: AC

## 2023-12-26 NOTE — Patient Instructions (Signed)

## 2023-12-26 NOTE — Progress Notes (Signed)
 12/26/2023 9:38 AM   Sandra JONETTA Mussel Oct 22, 1950 [id number]  Referring provider: Sheryle Carwin, MD [address] [place],  [place] [address]  No chief complaint on file.   HPI: [name] is a 73yo here for followup for prostate cancer and BPH. PSA 0.2. IPSS 4 QOL 1 on flomax  0.4mg  BID and vesicare  10mg  daily. Nocturia 1x. Uirne stream strong. No straining to urinate.    PMH: Past Medical History:  Diagnosis Date   Acid reflux    Atrial fibrillation (HCC)    Closed right scapular fracture    Depression    Dysrhythmia    GERD (gastroesophageal reflux disease)    Gout    Low testosterone    OSA on CPAP    Pre-diabetes    Prostate cancer (HCC)    Ribs, multiple fractures    Wears glasses     Surgical History: Past Surgical History:  Procedure Laterality Date   CARDIOVERSION N/A 04/02/2021   Procedure: CARDIOVERSION;  Surgeon: Debera Jayson MATSU, MD;  Location: [facility];  Service: Cardiovascular;  Laterality: N/A;   CARDIOVERSION N/A 11/18/2022   Procedure: CARDIOVERSION;  Surgeon: Alvan Dorn FALCON, MD;  Location: [facility];  Service: Endoscopy;  Laterality: N/A;   COLONOSCOPY WITH PROPOFOL  N/A 06/01/2018   Surgeon: Shaaron Lamar HERO, MD; 2 tubular adenomas, benign polyps, pancolonic diverticulosis.  Surveillance in 3 years.   COLONOSCOPY WITH PROPOFOL  N/A 11/25/2021   Procedure: COLONOSCOPY WITH PROPOFOL ;  Surgeon: Shaaron Lamar HERO, MD;  [facility];  Service: Endoscopy;  Laterality: N/A;  9:15am   CYSTOSCOPY N/A 06/26/2020   Procedure: CYSTOSCOPY;  Surgeon: Sherrilee Belvie CROME, MD;  [facility];  Service: Urology;  Laterality: N/A;   HEMOSTASIS CLIP PLACEMENT  11/25/2021   Procedure: HEMOSTASIS CLIP PLACEMENT;  Surgeon: Shaaron Lamar HERO, MD;  [facility];  Service: Endoscopy;;   HIP SURGERY Right 2010   Dr. 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   RADIOACTIVE SEED IMPLANT N/A 06/26/2020   Procedure: RADIOACTIVE SEED IMPLANT/BRACHYTHERAPY IMPLANT;  Surgeon: Sherrilee Belvie CROME, MD;  Location: [facility];  Service: Urology;  Laterality: N/A;   SPACE OAR INSTILLATION N/A 06/26/2020   Procedure: SPACE OAR INSTILLATION;  Surgeon: Sherrilee Belvie CROME, MD;  Location: [facility];  Service: Urology;  Laterality: N/A;    Home Medications:  Allergies as of 12/26/2023       Reactions   Penicillins Other (See Comments)   Mother told him throat swelled up when he took at a young age  Did it involve swelling of the face/tongue/throat, SOB, or low BP? Yes Did it involve sudden or severe rash/hives, skin peeling, or any reaction on the inside of your mouth or nose? Unknown Did you need to seek medical attention at a hospital or doctor's office? Yes When did it last happen?  Childhood reaction  If all above answers are NO, may proceed with cephalosporin use.        Medication List        Accurate as of December 26, 2023  9:38 AM. If you have any questions, ask your nurse or doctor.          allopurinol 300 MG tablet Commonly known as: ZYLOPRIM Take 300 mg by mouth in the morning.   buPROPion  150 MG 24 hr tablet Commonly known as: WELLBUTRIN  XL Take 150 mg by mouth in the morning, at noon, and at bedtime.   cholecalciferol 25 MCG (1000 UNIT) tablet Commonly known as: VITAMIN D3 Take 1,000 Units by mouth in the morning.   cyanocobalamin   1000 MCG tablet Commonly known as: VITAMIN B12 Take 1,000 mcg by mouth every other day. In the morning.   Eliquis  5 MG Tabs tablet Generic drug: apixaban  TAKE ONE TABLET BY MOUTH TWICE A DAY   flecainide  100 MG tablet Commonly known as: TAMBOCOR  TAKE ONE TABLET (100MG  TOTAL) BY MOUTH TWO TIMES DAILY   metoprolol  succinate 25 MG 24 hr tablet Commonly known as: Toprol  XL Take 1 tablet (25 mg total) by mouth daily.   omeprazole 20 MG tablet Commonly known as: PRILOSEC OTC Take 20 mg by mouth in the morning.   solifenacin  10 MG tablet Commonly known as: VESICARE  Take 1 tablet (10 mg total) by mouth daily.   tamsulosin  0.4 MG Caps capsule Commonly known as: FLOMAX  Take 1 capsule (0.4 mg total) by mouth in the morning and at bedtime.        Allergies:  Allergies  Allergen Reactions   Penicillins Other (See Comments)    Mother told him throat swelled up when he took at a young age  Did it involve swelling of the face/tongue/throat, SOB, or low BP? Yes Did it involve sudden or severe rash/hives, skin peeling, or any reaction on the inside of your mouth or nose? Unknown Did you need to seek medical attention at a hospital or doctor's office? Yes When did it last happen?  Childhood reaction    If all above answers are NO, may proceed with cephalosporin use.     Family History: Family History  Problem Relation Age of Onset   Diabetes Mother    Breast cancer Mother    Diabetes Father    Colon cancer Neg Hx    Pancreatic cancer Neg Hx    Prostate cancer Neg Hx     Social History:  reports that he quit smoking about 20 years ago. His smoking use included cigarettes. He started smoking about 30 years ago. He has a 10 pack-year smoking history. He has never used smokeless tobacco. He reports that he does not drink alcohol and does not use drugs.  ROS: All other review of systems were reviewed and are negative except what is noted above in HPI  Physical Exam: BP 125/68    Pulse 60   Wt 215 lb (97.5 kg)   BMI 30.41 kg/m   Constitutional:  Alert and oriented, No acute distress. HEENT: [place] AT, moist mucus membranes.  Trachea midline, no masses. Cardiovascular: No clubbing, cyanosis, or edema. Respiratory: Normal respiratory effort, no increased work of breathing. GI: Abdomen is soft, nontender, nondistended, no abdominal masses GU: No CVA tenderness.  Lymph: No cervical or inguinal lymphadenopathy. Skin: No rashes, bruises or suspicious lesions. Neurologic: Grossly intact, no focal deficits, moving all 4 extremities. Psychiatric: Normal mood and affect.  Laboratory Data: Lab Results  Component  Value Date   WBC 7.0 12/06/2022   HGB 17.9 (H) 12/06/2022   HCT 54.9 (H) 12/06/2022   MCV 90.9 12/06/2022   PLT 186 12/06/2022    Lab Results  Component Value Date   CREATININE 1.24 11/17/2022    No results found for: PSA  No results found for: TESTOSTERONE  Lab Results  Component Value Date   HGBA1C 5.9 (H) 04/02/2013    Urinalysis    Component Value Date/Time   COLORURINE YELLOW 09/10/2008 0630   APPEARANCEUR Clear 06/22/2023 0930   LABSPEC 1.025 09/10/2008 0630   PHURINE 5.5 09/10/2008 0630   GLUCOSEU Negative 06/22/2023 0930   HGBUR TRACE (A) 09/10/2008 0630   BILIRUBINUR Negative 06/22/2023 0930   KETONESUR NEGATIVE 09/10/2008 0630   PROTEINUR Negative 06/22/2023 0930   PROTEINUR NEGATIVE 09/10/2008 0630   UROBILINOGEN 0.2 09/10/2008 0630   NITRITE Negative 06/22/2023 0930   NITRITE NEGATIVE 09/10/2008 0630   LEUKOCYTESUR Trace (A) 06/22/2023 0930    Lab Results  Component Value Date   LABMICR See below: 06/22/2023   WBCUA 0-5 06/22/2023   LABEPIT 0-10 06/22/2023   BACTERIA None seen 06/22/2023    Pertinent Imaging:  No results found for this or any previous visit.  No results found for this or any previous visit.  No results found for this or any previous visit.  No results found for this or any previous  visit.  No results found for this or any previous visit.  No results found for this or any previous visit.  No results found for this or any previous visit.  No results found for this or any previous visit.   Assessment & Plan:    1. Prostate cancer (HCC) (Primary) -followup 1 year with PSA  2. Benign prostatic hyperplasia with urinary obstruction Continue flomax  0.4mg  BID  3. Nocturia -vesicare  10mg  daily   No follow-ups on file.  Belvie Clara, MD  [facility] [place]

## 2024-01-03 DIAGNOSIS — C61 Malignant neoplasm of prostate: Secondary | ICD-10-CM | POA: Diagnosis not present

## 2024-01-03 DIAGNOSIS — R7303 Prediabetes: Secondary | ICD-10-CM | POA: Diagnosis not present

## 2024-01-03 DIAGNOSIS — I4819 Other persistent atrial fibrillation: Secondary | ICD-10-CM | POA: Diagnosis not present

## 2024-01-03 DIAGNOSIS — Z79899 Other long term (current) drug therapy: Secondary | ICD-10-CM | POA: Diagnosis not present

## 2024-01-03 DIAGNOSIS — F329 Major depressive disorder, single episode, unspecified: Secondary | ICD-10-CM | POA: Diagnosis not present

## 2024-01-03 DIAGNOSIS — M1000 Idiopathic gout, unspecified site: Secondary | ICD-10-CM | POA: Diagnosis not present

## 2024-01-10 DIAGNOSIS — D511 Vitamin B12 deficiency anemia due to selective vitamin B12 malabsorption with proteinuria: Secondary | ICD-10-CM | POA: Diagnosis not present

## 2024-01-10 DIAGNOSIS — Z8546 Personal history of malignant neoplasm of prostate: Secondary | ICD-10-CM | POA: Diagnosis not present

## 2024-01-10 DIAGNOSIS — M1009 Idiopathic gout, multiple sites: Secondary | ICD-10-CM | POA: Diagnosis not present

## 2024-01-10 DIAGNOSIS — Z23 Encounter for immunization: Secondary | ICD-10-CM | POA: Diagnosis not present

## 2024-01-10 DIAGNOSIS — E1129 Type 2 diabetes mellitus with other diabetic kidney complication: Secondary | ICD-10-CM | POA: Diagnosis not present

## 2024-01-10 DIAGNOSIS — N1831 Chronic kidney disease, stage 3a: Secondary | ICD-10-CM | POA: Diagnosis not present

## 2024-01-10 DIAGNOSIS — I480 Paroxysmal atrial fibrillation: Secondary | ICD-10-CM | POA: Diagnosis not present

## 2024-01-25 DIAGNOSIS — H538 Other visual disturbances: Secondary | ICD-10-CM | POA: Diagnosis not present

## 2024-01-25 DIAGNOSIS — H2511 Age-related nuclear cataract, right eye: Secondary | ICD-10-CM | POA: Diagnosis not present

## 2024-01-25 DIAGNOSIS — H16229 Keratoconjunctivitis sicca, not specified as Sjogren's, unspecified eye: Secondary | ICD-10-CM | POA: Diagnosis not present

## 2024-01-25 DIAGNOSIS — Z961 Presence of intraocular lens: Secondary | ICD-10-CM | POA: Diagnosis not present

## 2024-01-25 DIAGNOSIS — H43813 Vitreous degeneration, bilateral: Secondary | ICD-10-CM | POA: Diagnosis not present

## 2024-02-08 DIAGNOSIS — H2511 Age-related nuclear cataract, right eye: Secondary | ICD-10-CM | POA: Diagnosis not present

## 2024-02-20 NOTE — Discharge Instructions (Signed)

## 2024-02-22 ENCOUNTER — Encounter: Admission: RE | Disposition: A | Discharge status: Home / Self Care | Payer: Self-pay | Source: Home / Self Care

## 2024-02-22 ENCOUNTER — Ambulatory Visit: Payer: Self-pay | Admitting: General Practice

## 2024-02-22 ENCOUNTER — Other Ambulatory Visit: Payer: Self-pay

## 2024-02-22 ENCOUNTER — Ambulatory Visit: Admission: RE | Admit: 2024-02-22 | Discharge: 2024-02-22 | Disposition: A | Discharge status: Home / Self Care

## 2024-02-22 DIAGNOSIS — I4891 Unspecified atrial fibrillation: Secondary | ICD-10-CM | POA: Insufficient documentation

## 2024-02-22 DIAGNOSIS — E785 Hyperlipidemia, unspecified: Secondary | ICD-10-CM | POA: Diagnosis not present

## 2024-02-22 DIAGNOSIS — G4733 Obstructive sleep apnea (adult) (pediatric): Secondary | ICD-10-CM | POA: Diagnosis not present

## 2024-02-22 DIAGNOSIS — G4730 Sleep apnea, unspecified: Secondary | ICD-10-CM | POA: Diagnosis not present

## 2024-02-22 DIAGNOSIS — K5730 Diverticulosis of large intestine without perforation or abscess without bleeding: Secondary | ICD-10-CM | POA: Diagnosis not present

## 2024-02-22 DIAGNOSIS — F32A Depression, unspecified: Secondary | ICD-10-CM | POA: Diagnosis not present

## 2024-02-22 DIAGNOSIS — Z87891 Personal history of nicotine dependence: Secondary | ICD-10-CM | POA: Diagnosis not present

## 2024-02-22 DIAGNOSIS — K219 Gastro-esophageal reflux disease without esophagitis: Secondary | ICD-10-CM | POA: Insufficient documentation

## 2024-02-22 DIAGNOSIS — H2511 Age-related nuclear cataract, right eye: Secondary | ICD-10-CM | POA: Insufficient documentation

## 2024-02-22 HISTORY — PX: CATARACT EXTRACTION W/PHACO: SHX586

## 2024-02-22 SURGERY — PHACOEMULSIFICATION, CATARACT, WITH IOL INSERTION
Anesthesia: Monitor Anesthesia Care | Site: Eye | Laterality: Right

## 2024-02-22 MED ORDER — CYCLOPENTOLATE HCL 2 % OP SOLN
1.0000 [drp] | OPHTHALMIC | Status: AC
  Administered 2024-02-22 (×3): 1 [drp] via OPHTHALMIC

## 2024-02-22 MED ORDER — SIGHTPATH DOSE#1 BSS IO SOLN
INTRAOCULAR | Status: DC | PRN
  Administered 2024-02-22: 15 mL via INTRAOCULAR

## 2024-02-22 MED ORDER — MOXIFLOXACIN HCL 0.5 % OP SOLN
OPHTHALMIC | Status: DC | PRN
Start: 2024-02-22 — End: 2024-02-22
  Administered 2024-02-22: .2 mL via OPHTHALMIC

## 2024-02-22 MED ORDER — MIDAZOLAM HCL 2 MG/2ML IJ SOLN
INTRAMUSCULAR | Status: AC
  Filled 2024-02-22: qty 2

## 2024-02-22 MED ORDER — EPINEPHRINE PF 1 MG/ML IJ SOLN
INTRAMUSCULAR | Status: DC | PRN
  Administered 2024-02-22: 92 mL via OPHTHALMIC

## 2024-02-22 MED ORDER — LIDOCAINE HCL (PF) 2 % IJ SOLN
INTRAMUSCULAR | Status: DC | PRN
  Administered 2024-02-22: 4 mL via INTRAOCULAR

## 2024-02-22 MED ORDER — TETRACAINE HCL 0.5 % OP SOLN
1.0000 [drp] | OPHTHALMIC | Status: DC | PRN
  Administered 2024-02-22 (×3): 1 [drp] via OPHTHALMIC

## 2024-02-22 MED ORDER — TETRACAINE HCL 0.5 % OP SOLN: 1.0000 [drp] | OPHTHALMIC | Status: DC | PRN

## 2024-02-22 MED ORDER — FENTANYL CITRATE (PF) 100 MCG/2ML IJ SOLN
INTRAMUSCULAR | Status: DC | PRN
  Administered 2024-02-22: 50 ug via INTRAVENOUS

## 2024-02-22 MED ORDER — BRIMONIDINE TARTRATE-TIMOLOL 0.2-0.5 % OP SOLN
OPHTHALMIC | Status: DC | PRN
  Administered 2024-02-22: 1 [drp] via OPHTHALMIC

## 2024-02-22 MED ORDER — FENTANYL CITRATE (PF) 100 MCG/2ML IJ SOLN
INTRAMUSCULAR | Status: AC
  Filled 2024-02-22: qty 2

## 2024-02-22 MED ORDER — PHENYLEPHRINE HCL 10 % OP SOLN
OPHTHALMIC | Status: AC
  Filled 2024-02-22: qty 5

## 2024-02-22 MED ORDER — PHENYLEPHRINE HCL 10 % OP SOLN
1.0000 [drp] | OPHTHALMIC | Status: AC
  Administered 2024-02-22 (×3): 1 [drp] via OPHTHALMIC

## 2024-02-22 MED ORDER — MIDAZOLAM HCL (PF) 2 MG/2ML IJ SOLN
INTRAMUSCULAR | Status: DC | PRN
  Administered 2024-02-22: 2 mg via INTRAVENOUS

## 2024-02-22 MED ORDER — SIGHTPATH DOSE#1 NA HYALUR & NA CHOND-NA HYALUR IO KIT
PACK | INTRAOCULAR | Status: DC | PRN
  Administered 2024-02-22: 1 via OPHTHALMIC

## 2024-02-22 MED ORDER — TETRACAINE HCL 0.5 % OP SOLN
OPHTHALMIC | Status: AC
  Filled 2024-02-22: qty 4

## 2024-02-22 MED ORDER — CYCLOPENTOLATE HCL 2 % OP SOLN
OPHTHALMIC | Status: AC
  Filled 2024-02-22: qty 2

## 2024-02-22 SURGICAL SUPPLY — 10 items
CANNULA HYDRODISSEC NUC 25X7/8 (MISCELLANEOUS) ×1 IMPLANT
DRSG TEGADERM 2-3/8X2-3/4 SM (GAUZE/BANDAGES/DRESSINGS) ×1 IMPLANT
FEE CATARACT SUITE SIGHTPATH (MISCELLANEOUS) ×1 IMPLANT
GLOVE BIOGEL PI IND STRL 8 (GLOVE) ×1 IMPLANT
GLOVE SURG LX STRL 7.5 STRW (GLOVE) ×1 IMPLANT
GLOVE SURG SYN 6.5 PF PI BL (GLOVE) ×1 IMPLANT
LENS IOL CLRN CLEAR 18.0 (Intraocular Lens) IMPLANT
NDL FILTER BLUNT 18X1 1/2 (NEEDLE) ×1 IMPLANT
NEEDLE FILTER BLUNT 18X1 1/2 (NEEDLE) ×1 IMPLANT
SYR 3ML LL SCALE MARK (SYRINGE) ×1 IMPLANT

## 2024-02-22 NOTE — Op Note (Signed)
 PREOPERATIVE DIAGNOSIS:  Nuclear sclerotic cataract of the right eye.   POSTOPERATIVE DIAGNOSIS:  Right Eye Cataract   OPERATIVE PROCEDURE:ORPROCALL@   SURGEON:  Curtistine Fava, MD   ANESTHESIA:  Anesthesiologist: Dario Barter, MD CRNA: Jahoo, Sonia, CRNA  Monitored anesthesia care. Topical tetracaine drops followed by 2% Xylocaine  jelly applied in the preoperative holding area 0.39ml of epi-Shugarcaine was instilled in the eye following the paracentesis.   COMPLICATIONS:  None.   TECHNIQUE:   Phacoemulsification divide and conquer   DESCRIPTION OF PROCEDURE:  The patient was examined and consented in the preoperative holding area where the aforementioned topical anesthesia was applied to the right eye and then brought back to the Operating Room where the right eye was prepped and draped in the usual sterile ophthalmic fashion and a lid speculum was placed. A paracentesis was created with the side port blade and the anterior chamber was filled with epi-Shugarcaine follower by viscoelastic. A clear corneal incision was performed with the steel keratome. A continuous curvilinear capsulorrhexis was performed with a cystotome followed by the capsulorrhexis forceps. Hydrodissection and hydrodelineation were carried out with BSS on a blunt cannula. The lens was removed in a divide and conquer technique and the remaining cortical material was removed with the irrigation-aspiration handpiece. The capsular bag was inflated with viscoelastic and the [id number] +18.0 lens was placed in the capsular bag without complication. The remaining viscoelastic was removed from the eye with the irrigation-aspiration handpiece. The wounds were hydrated. The anterior chamber was flushed with BSS and the eye was inflated to physiologic pressure. 0.1ml of Vigamox was placed in the anterior chamber. The wounds were found to be water  tight. The eye was dressed with Combigan and covered with a clear shield to be worn until the  first postoperative day appointment. The patient was given protective glasses to wear throughout the day. The patient was also given drops with which to begin a drop regimen today and will follow-up with me in one day. Implant Name Type Inv. Item Serial No. Manufacturer Lot No. LRB No. Used Action  LENS IOL CLRN CLEAR 18.0 - [id number] Intraocular Lens LENS IOL CLRN CLEAR 18.0 [id number] [employer]  Right 1 Implanted   Procedure(s): PHACOEMULSIFICATION, CATARACT, WITH IOL INSERTION 11.77 01:22.8 (Right)  Electronically signed: Curtistine PARAS [facility] 02/22/2024 3:30 PM

## 2024-02-22 NOTE — Transfer of Care (Signed)
 Immediate Anesthesia Transfer of Care Note  Patient: Brandon Maddox  Procedure(s) Performed: PHACOEMULSIFICATION, CATARACT, WITH IOL INSERTION 11.77 01:22.8 (Right: Eye)  Patient Location: PACU  Anesthesia Type: MAC  Level of Consciousness: awake, alert  and patient cooperative  Airway and Oxygen Therapy: Patient Spontanous Breathing and Patient connected to supplemental oxygen  Post-op Assessment: Post-op Vital signs reviewed, Patient's Cardiovascular Status Stable, Respiratory Function Stable, Patent Airway and No signs of Nausea or vomiting  Post-op Vital Signs: Reviewed and stable  Complications: No notable events documented.

## 2024-02-22 NOTE — H&P (Signed)
 [facility]   Primary Care Physician:  Sheryle Carwin, MD Ophthalmologist: Dr. Curtistine Fava  Pre-Procedure History & Physical: HPI:  Brandon Maddox is a 73 y.o. male here for cataract surgery.   Past Medical History:  Diagnosis Date   Acid reflux    Atrial fibrillation (HCC)    Closed right scapular fracture    Depression    Dysrhythmia    GERD (gastroesophageal reflux disease)    Gout    Low testosterone    OSA on CPAP    Pre-diabetes    Prostate cancer (HCC)    Ribs, multiple fractures    Wears glasses     Past Surgical History:  Procedure Laterality Date   CARDIOVERSION N/A 04/02/2021   Procedure: CARDIOVERSION;  Surgeon: Debera Jayson MATSU, MD;  Location: [facility];  Service: Cardiovascular;  Laterality: N/A;   CARDIOVERSION N/A 11/18/2022   Procedure: CARDIOVERSION;  Surgeon: Alvan Dorn FALCON, MD;  Location: [facility];  Service: Endoscopy;  Laterality: N/A;   COLONOSCOPY WITH PROPOFOL  N/A 06/01/2018   Surgeon: Shaaron Lamar HERO, MD; 2 tubular adenomas, benign polyps, pancolonic diverticulosis.  Surveillance in 3 years.   COLONOSCOPY WITH PROPOFOL  N/A 11/25/2021   Procedure: COLONOSCOPY WITH PROPOFOL ;  Surgeon: Shaaron Lamar HERO, MD;  Location: [facility];  Service: Endoscopy;  Laterality: N/A;  9:15am   CYSTOSCOPY N/A 06/26/2020   Procedure: CYSTOSCOPY;  Surgeon: Sherrilee Belvie CROME, MD;  Location: [facility];  Service: Urology;  Laterality: N/A;   HEMOSTASIS CLIP PLACEMENT  11/25/2021   Procedure: HEMOSTASIS CLIP PLACEMENT;  Surgeon: Shaaron Lamar HERO, MD;  Location: [facility];  Service: Endoscopy;;   HIP SURGERY Right 2010   Dr. Cassell and then replacement of right hip   LACERATION REPAIR  05/03/2012   Procedure: REPAIR MULTIPLE LACERATIONS;  Surgeon: Alm Bouche, MD;  Location: [facility];  Service: ENT;  Laterality: N/A;   PENILE PROSTHESIS IMPLANT     PENILE PROSTHESIS IMPLANT  2014   POLYPECTOMY  06/01/2018   Procedure: POLYPECTOMY;  Surgeon:  Shaaron Lamar HERO, MD;  Location: [facility];  Service: Endoscopy;;  cecum,hepatic flexure   POLYPECTOMY  11/25/2021   Procedure: POLYPECTOMY;  Surgeon: Shaaron Lamar HERO, MD;  Location: [facility];  Service: Endoscopy;;   Pyloric stenosis     77 weeks old   RADIOACTIVE SEED IMPLANT N/A 06/26/2020   Procedure: RADIOACTIVE SEED IMPLANT/BRACHYTHERAPY IMPLANT;  Surgeon: Sherrilee Belvie CROME, MD;  Location: [facility];  Service: Urology;  Laterality: N/A;   SPACE OAR INSTILLATION N/A 06/26/2020   Procedure: SPACE OAR INSTILLATION;  Surgeon: Sherrilee Belvie CROME, MD;  Location: [facility];  Service: Urology;  Laterality: N/A;    Prior to Admission medications   Medication Sig Start Date End Date Taking? Authorizing Provider  allopurinol (ZYLOPRIM) 300 MG tablet Take 300 mg by mouth in the morning. 04/29/15  Yes [provider]  atorvastatin (LIPITOR) 20 MG tablet Take 20 mg by mouth at bedtime.   Yes [provider]  buPROPion  (WELLBUTRIN  XL) 150 MG 24 hr tablet Take 150 mg by mouth in the morning, at noon, and at bedtime. 08/26/15  Yes [provider]  cholecalciferol (VITAMIN D3) 25 MCG (1000 UNIT) tablet Take 1,000 Units by mouth in the morning.   Yes [provider]  ELIQUIS  5 MG TABS tablet TAKE ONE TABLET BY MOUTH TWICE A DAY 11/07/23  Yes Debera Jayson MATSU, MD  flecainide  (TAMBOCOR ) 100 MG tablet  TAKE ONE TABLET (100MG  TOTAL) BY MOUTH TWO TIMES DAILY 08/19/23  Yes Debera Jayson MATSU, MD  metoprolol  succinate (TOPROL  XL) 25 MG 24 hr tablet Take 1 tablet (25 mg total) by mouth daily. 08/27/15  Yes Debera Jayson MATSU, MD  omeprazole (PRILOSEC OTC) 20 MG tablet Take 20 mg by mouth in the morning. 05/09/12  Yes [provider]  solifenacin  (VESICARE ) 10 MG tablet Take 1 tablet (10 mg total) by mouth daily. 12/26/23  Yes McKenzie, Belvie CROME, MD  tamsulosin  (FLOMAX ) 0.4 MG CAPS capsule Take 1 capsule (0.4 mg total) by mouth in the  morning and at bedtime. 12/26/23  Yes McKenzie, Belvie CROME, MD  vitamin B-12 (CYANOCOBALAMIN ) 1000 MCG tablet Take 1,000 mcg by mouth every other day. In the morning.   Yes [provider]    Allergies as of 01/27/2024 - Review Complete 12/26/2023  Allergen Reaction Noted   Penicillins Other (See Comments) 01/07/2011    Family History  Problem Relation Age of Onset   Diabetes Mother    Breast cancer Mother    Diabetes Father    Colon cancer Neg Hx    Pancreatic cancer Neg Hx    Prostate cancer Neg Hx     Social History   Socioeconomic History   Marital status: Divorced    Spouse name: Not on file   Number of children: 1   Years of education: Not on file   Highest education level: Not on file  Occupational History    Employer: SELF EMPLOYED    Comment: retired  Tobacco Use   Smoking status: Former    Current packs/day: 0.00    Average packs/day: 1 pack/day for 10.0 years (10.0 ttl pk-yrs)    Types: Cigarettes    Start date: 04/19/1993    Quit date: 04/20/2003    Years since quitting: 20.8   Smokeless tobacco: Never  Vaping Use   Vaping status: Never Used  Substance and Sexual Activity   Alcohol use: No    Comment: History of alcohol abuse in the past; none in 10 years   Drug use: Yes    Types: Cocaine    Comment: 20 years ago   Sexual activity: Yes  Other Topics Concern   Not on file  Social History Narrative   Not on file   Social Drivers of Health   Financial Resource Strain: Not on file  Food Insecurity: Not on file  Transportation Needs: Not on file  Physical Activity: Not on file  Stress: Not on file  Social Connections: Not on file  Intimate Partner Violence: Not on file    Review of Systems: See HPI, otherwise negative ROS  Physical Exam: BP 129/76   Temp 97.8 F (36.6 C) (Temporal)   Resp 19   Ht 5' 10 (1.778 m)   Wt 95.8 kg   SpO2 98%   BMI 30.32 kg/m  General:   Alert, cooperative in NAD Head:  Normocephalic and  atraumatic. Respiratory:  Normal work of breathing. Cardiovascular:  RRR  Impression/Plan: Brandon Maddox is here for cataract surgery.  Risks, benefits, limitations, and alternatives regarding cataract surgery have been reviewed with the patient.  Questions have been answered.  All parties agreeable.   Curtistine JINNY Fava, MD  02/22/2024, 2:57 PM

## 2024-02-22 NOTE — Anesthesia Postprocedure Evaluation (Signed)
 Anesthesia Post Note  Patient: Brandon Maddox  Procedure(s) Performed: PHACOEMULSIFICATION, CATARACT, WITH IOL INSERTION 11.77 01:22.8 (Right: Eye)  Patient location during evaluation: PACU Anesthesia Type: MAC Level of consciousness: awake and alert Pain management: pain level controlled Vital Signs Assessment: post-procedure vital signs reviewed and stable Respiratory status: spontaneous breathing, nonlabored ventilation, respiratory function stable and patient connected to nasal cannula oxygen Cardiovascular status: stable and blood pressure returned to baseline Postop Assessment: no apparent nausea or vomiting Anesthetic complications: no   No notable events documented.   Last Vitals:  Vitals:   02/22/24 1532 02/22/24 1537  BP: 118/69   Pulse: (!) 55   Resp: 16   Temp: 36.7 C 36.7 C  SpO2: 98%     Last Pain:  Vitals:   02/22/24 1537  TempSrc:   PainSc: 0-No pain                 Prentice Murphy

## 2024-02-22 NOTE — Anesthesia Preprocedure Evaluation (Signed)
 Anesthesia Evaluation  Patient identified by MRN, date of birth, ID band Patient awake    Reviewed: Allergy & Precautions, H&P , NPO status , Patient's Chart, lab work & pertinent test results  History of Anesthesia Complications Negative for: history of anesthetic complications  Airway Mallampati: II  TM Distance: >3 FB Neck ROM: full    Dental  (+) Caps, Dental Advidsory Given, Missing   Pulmonary neg shortness of breath, sleep apnea and Continuous Positive Airway Pressure Ventilation , neg COPD, neg recent URI, former smoker   Pulmonary exam normal breath sounds clear to auscultation       Cardiovascular Exercise Tolerance: Good (-) hypertension(-) angina (-) Past MI and (-) Cardiac Stents + dysrhythmias Atrial Fibrillation (-) Valvular Problems/Murmurs Rhythm:regular Rate:Normal     Neuro/Psych  PSYCHIATRIC DISORDERS  Depression    negative neurological ROS     GI/Hepatic Neg liver ROS,GERD  Controlled and Medicated,,  Endo/Other  negative endocrine ROS    Renal/GU negative Renal ROS  negative genitourinary   Musculoskeletal   Abdominal   Peds  Hematology negative hematology ROS (+)   Anesthesia Other Findings Past Medical History: No date: Acid reflux No date: Atrial fibrillation (HCC) No date: Closed right scapular fracture No date: Depression No date: Dysrhythmia No date: GERD (gastroesophageal reflux disease) No date: Gout No date: Low testosterone No date: OSA on CPAP No date: Pre-diabetes No date: Prostate cancer (HCC) No date: Ribs, multiple fractures No date: Wears glasses   Reproductive/Obstetrics negative OB ROS                              Anesthesia Physical Anesthesia Plan  ASA: 2  Anesthesia Plan: MAC   Post-op Pain Management:    Induction: Intravenous  PONV Risk Score and Plan: 1 and Midazolam  and Treatment may vary due to age or medical  condition  Airway Management Planned: Natural Airway and Nasal Cannula  Additional Equipment:   Intra-op Plan:   Post-operative Plan:   Informed Consent: I have reviewed the patients History and Physical, chart, labs and discussed the procedure including the risks, benefits and alternatives for the proposed anesthesia with the patient or authorized representative who has indicated his/her understanding and acceptance.     Dental Advisory Given  Plan Discussed with: Anesthesiologist, CRNA and Surgeon  Anesthesia Plan Comments:          Anesthesia Quick Evaluation

## 2024-03-26 ENCOUNTER — Ambulatory Visit: Admitting: Cardiology

## 2024-05-10 ENCOUNTER — Other Ambulatory Visit: Payer: Self-pay | Admitting: Cardiology

## 2024-05-10 DIAGNOSIS — I4819 Other persistent atrial fibrillation: Secondary | ICD-10-CM

## 2024-05-10 NOTE — Telephone Encounter (Signed)
 Prescription refill request for Eliquis  received. Indication: afib  Last office visit: 08/09/2023 Scr: 1.26, 06/08/2023 Age: 74 yo  Weight:  95.8 kg   Refill sent.

## 2024-05-28 ENCOUNTER — Ambulatory Visit: Admitting: Cardiology

## 2024-12-17 ENCOUNTER — Other Ambulatory Visit

## 2024-12-31 ENCOUNTER — Ambulatory Visit: Admitting: Urology
